# Patient Record
Sex: Male | Born: 1950 | Race: White | Hispanic: No | Smoking: Never smoker
Health system: Southern US, Community
[De-identification: ages and names within clinical notes are randomized; demographics above are authoritative.]

## PROBLEM LIST (undated history)

## (undated) DIAGNOSIS — C61 Malignant neoplasm of prostate: Secondary | ICD-10-CM

## (undated) DIAGNOSIS — R011 Cardiac murmur, unspecified: Secondary | ICD-10-CM

## (undated) DIAGNOSIS — I341 Nonrheumatic mitral (valve) prolapse: Secondary | ICD-10-CM

## (undated) DIAGNOSIS — I639 Cerebral infarction, unspecified: Secondary | ICD-10-CM

## (undated) DIAGNOSIS — T7840XA Allergy, unspecified, initial encounter: Secondary | ICD-10-CM

## (undated) DIAGNOSIS — I493 Ventricular premature depolarization: Secondary | ICD-10-CM

## (undated) DIAGNOSIS — I4891 Unspecified atrial fibrillation: Secondary | ICD-10-CM

## (undated) DIAGNOSIS — C439 Malignant melanoma of skin, unspecified: Secondary | ICD-10-CM

## (undated) DIAGNOSIS — I10 Essential (primary) hypertension: Secondary | ICD-10-CM

## (undated) DIAGNOSIS — E785 Hyperlipidemia, unspecified: Secondary | ICD-10-CM

## (undated) HISTORY — DX: Essential (primary) hypertension: I10

## (undated) HISTORY — DX: Malignant neoplasm of prostate: C61

## (undated) HISTORY — DX: Unspecified atrial fibrillation: I48.91

## (undated) HISTORY — DX: Malignant melanoma of skin, unspecified: C43.9

## (undated) HISTORY — DX: Ventricular premature depolarization: I49.3

## (undated) HISTORY — DX: Nonrheumatic mitral (valve) prolapse: I34.1

## (undated) HISTORY — DX: Cerebral infarction, unspecified: I63.9

## (undated) HISTORY — DX: Allergy, unspecified, initial encounter: T78.40XA

## (undated) HISTORY — DX: Cardiac murmur, unspecified: R01.1

## (undated) HISTORY — DX: Hyperlipidemia, unspecified: E78.5

---

## 2000-02-09 HISTORY — PX: MITRAL VALVE REPAIR: SHX2039

## 2000-04-27 ENCOUNTER — Ambulatory Visit (HOSPITAL_COMMUNITY): Admission: RE | Admit: 2000-04-27 | Discharge: 2000-04-27 | Payer: Self-pay | Admitting: Cardiology

## 2000-05-13 ENCOUNTER — Encounter: Payer: Self-pay | Admitting: Thoracic Surgery (Cardiothoracic Vascular Surgery)

## 2000-05-17 ENCOUNTER — Encounter: Payer: Self-pay | Admitting: Thoracic Surgery (Cardiothoracic Vascular Surgery)

## 2000-05-17 ENCOUNTER — Inpatient Hospital Stay (HOSPITAL_COMMUNITY)
Admission: RE | Admit: 2000-05-17 | Discharge: 2000-05-22 | Payer: Self-pay | Admitting: Thoracic Surgery (Cardiothoracic Vascular Surgery)

## 2000-05-17 ENCOUNTER — Encounter (INDEPENDENT_AMBULATORY_CARE_PROVIDER_SITE_OTHER): Payer: Self-pay | Admitting: Specialist

## 2000-05-18 ENCOUNTER — Encounter: Payer: Self-pay | Admitting: Thoracic Surgery (Cardiothoracic Vascular Surgery)

## 2000-05-19 ENCOUNTER — Encounter: Payer: Self-pay | Admitting: Thoracic Surgery (Cardiothoracic Vascular Surgery)

## 2003-03-24 ENCOUNTER — Emergency Department (HOSPITAL_COMMUNITY): Admission: EM | Admit: 2003-03-24 | Discharge: 2003-03-25 | Payer: Self-pay | Admitting: Emergency Medicine

## 2008-09-30 ENCOUNTER — Emergency Department (HOSPITAL_COMMUNITY): Admission: EM | Admit: 2008-09-30 | Discharge: 2008-09-30 | Payer: Self-pay | Admitting: Emergency Medicine

## 2010-05-16 LAB — POCT I-STAT, CHEM 8
BUN: 21 mg/dL (ref 6–23)
Calcium, Ion: 1.19 mmol/L (ref 1.12–1.32)
Chloride: 105 mEq/L (ref 96–112)
Creatinine, Ser: 1 mg/dL (ref 0.4–1.5)
Glucose, Bld: 96 mg/dL (ref 70–99)
HCT: 42 % (ref 39.0–52.0)
Hemoglobin: 14.3 g/dL (ref 13.0–17.0)
Potassium: 3.5 mEq/L (ref 3.5–5.1)
Sodium: 140 mEq/L (ref 135–145)
TCO2: 28 mmol/L (ref 0–100)

## 2010-06-26 NOTE — Op Note (Signed)
[name]. [facility]  Patient:    Robert Lynn, Robert Lynn                      MRN: [id number] Proc. Date: 05/17/00 Adm. Date:  [id number] Attending:  Tressie Stalker CC:         Robert Lynn, M.D.  Robert Lynn, M.D.  CVTS Office   Operative Report  PREOPERATIVE DIAGNOSIS:  Mitral valve prolapse with severe mitral regurgitation.  POSTOPERATIVE DIAGNOSIS:  Mitral valve prolapse with severe mitral regurgitation.  PROCEDURE:  Median sternotomy for mitral valve repair (quadrangular resection of posterior leaflet, chordal transfer from posterior to anterior leaflet x 2, artificial chordal replacement x 3, #25 Carpentier-Edwards ring annuloplasty)  SURGEON:  Robert Lynn. Robert Lynn, M.D.  ASSISTANT:  Robert Lynn, P.A.  ANESTHESIA:  General.  BRIEF CLINICAL NOTE:  The patient is a 60 year old male from [name], [name] followed by Dr. Theressa Lynn and referred by Dr. Carolanne Lynn for management of mitral regurgitation.  The patient has a history of mitral valve prolapse and he now presents with a 6 month history of progressive dyspnea on exertion and occasional palpitations.  Transesophageal echocardiogram demonstrates moderate to severe mitral valve regurgitation with prolapse involving both anterior and posterior leaflets of the mitral valve.  There is enlargement of the left atrium.  Left ventricular function appears preserved.  Coronary arteriography demonstrates normal coronary anatomy with no significant coronary artery disease.  OPERATIVE CONSENT:  The patient is counselled at length regarding the indications and potential benefits of mitral valve repair and possible replacement.  He understands the rational for proceeding under the current circumstances rather continued medical therapy.  Alternative strategies have been discussed.  Alternative surgical options have been discussed.  All of his questions have been addressed.  He accepts all  associated risks of surgery, including but not limited to risk of death, stroke, myocardial infarction, bleeding requiring blood transfusion, arrhythmia, heart block requiring permanent pacemaker, and potential need for prosthetic valve mitral valve replacement.  DESCRIPTION OF PROCEDURE:  The patient was brought to the operating room on the above-mentioned date and invasive hemodynamic monitoring is established by the anesthesia service under the care and direction of Dr. Sheldon Lynn. Intravenous antibiotics are administered.  Following the induction with general endotracheal anesthesia, transesophageal echocardiogram is performed by Dr. Ivin Lynn with Dr. Sharee Lynn assisting.  Transesophageal echocardiogram confirms the presence of severe chronic prolapse involving both the anterior and posterior leaflets with redundant leaflet tissue and severe mitral regurgitation.  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  There is no sign of any flail cords or sign of ruptured cords.  No other significant abnormalities are noted.  Left ventricular function appears preserved although the left ventricle is mildly to moderately dilated.  The patients chest, abdomen, both groins and both lower extremities are prepped and draped in a sterile manner.  A median sternotomy incision is performed.  The patient is heparinized systemically.  The pericardium is opened.  The ascending aorta is inspected and is notably free of any palpable plaques or calcifications.  The ascending aorta is cannulated uneventfully.  Dual venous cannulation is established using a right-angle metal tipped cannula placed directly in the superior vena cava with a straight cannula placed through the right atrial appendage into the inferior vena cava.  A retrograde cardioplegia catheter is placed through the right atrium into the coronary sinus.  Adequate heparinization is verified.  Cardiopulmonary bypass is  begun.  Umbilical tapes are placed around both the superior and inferior vena cava.  A cardioplegia catheter is placed in the ascending aorta.  A temperature probe is placed in the left ventricular septum.   The intra-atrial groove is dissected posteriorly to facilitate exposure.  The patient is cooled to 28 degrees systemic temperature.  The aortic cross-clamp is applied and cardioplegia is delivered in an antegrade fashion through the aortic root.  Iced saline slush is applied for topical hypothermia.  The initial cardioplegic arrest and myocardial cooling are found to be excellent.  Repeat doses of cardioplegia are administered retrograde through the coronary sinus catheter intermittently throughout the cross-clamp portion of the operation to maintain septal temperature below 15 degrees centigrade.  A left atriotomy incision is performed through the intra-atrial groove posteriorly and the mitral valve is exposed using a self-retaining retractor. The mitral valve is carefully analyzed and inspected.  The annulus is dilated. There is severe prolapse involving both the anterior and posterior leaflets of the mitral valve with chronic elongation of the majority of the primary cords for both leaflets.  The leaflets are thickened and redundant.  The commissures themselves are not prolapsed.  The most severe area of prolapse is located eccentrically between the p1 and p2 portions of the posterior leaflet and directly across at the corresponding portion of the anterior leaflet between a1 and a2.  There are no flail cords identified and the subvalvular apparatus appears otherwise normal with exception of the elongation of many of the primary cords.  Quadrangular resection of the posterior leaflet is performed removing approximately 1/4 to 1/3 of the posterior leaflet somewhat eccentrically  between the p1 and p2 portions of the posterior leaflet.  During mobilization of this portion of the  valve 2 large secondary cords from the posterior leaflet are identified which are not elongated and appear to be ideally situated to be transferred to the anterior leaflet to facilitate correction of the prolapsed anterior leaflet.  These are saved during mobilization.  The resected portion of the posterior leaflet is set for routine pathologic evaluation.  A sliding leaflet annuloplasty is performed to close the remainder of the posterior leaflet.  Prior to reattaching the posterior leaflet after mobilization in both directions, several interrupted 2-0 Ethibond sutures are placed through the annulus posteriorly to serve as plication sutures or compression sutures shortening the posterior circumference.  The posterior leaflet is then reattached to the posterior annulus using 2 layer running 4-0 Ethibond suture. The remaining vertical defect between the two corresponding portions of the posterior leaflet is closed using several interrupted 5-0 Ethibond sutures. The anterior leaflet prolapse is then corrected by transposing two separate secondary cords from the posterior leaflet to the rough zone along the undersurface of the anterior leaflet.  In addition, a 6-0 Gore-Tex suture is placed through the head of the anterior papillary muscle to be reattached to the undersurface of the anterior leaflet during a later portion of the procedure to further buttress the correction of the prolapse of the anterior leaflet.  Ring annuloplasty is performed using interrupted horizontal mattress 2-0 Ethibond sutures placed circumferentially around the mitral annulus.  The annulus is sized to between 28 and a 30 mm ring.  A 28 mm standard Carpentier-Edwards classic ring is chosen because of the associated geometry of the valve with a relatively elongated anterior-posterior direction.  The ring annuloplasty is completed and the ring is secured in place circumferentially without difficulty.  At this  juncture the Gore-Tex suture previously placed  through the head of the anterior papillary muscle is then secured to the undersurface of the anterior leaflet of the mitral valve.  The left ventricle is inflated with iced cold saline in order to measure the appropriate length to tie the Gore-Tex artificial cord suture.  After completion of the repair there appears to be a small area of extra redundancy with prolapse of the posterior leaflet between the p2 and p3 portion of the leaflet.  There is no associated regurgitation.  However, in order to correct this prolapse two additional artificial cords are placed using 6-0 Gore-Tex suture placed through the posterior papillary muscle head and secured to the rough zone of the undersurface of the posterior leaflet.  Again, the left ventricle is filled with iced saline in order to measure the appropriate length to secure the suture replacing the artificial cords.  After final completion of the repair the valve is tested and appears to be perfectly competent.  Rewarming is begun.  The left atriotomy is closed using a 2 layer closure of running 3-0 Prolene suture.  After completion of the closure the left heart circulation is allowed to fill with blood while the lungs are ventilated and the patient is placed in the Trendelenburg position.  All air is evacuated from the pulmonary veins, left atrium, left ventricle, and aortic root while venting through the aortic root.  Once final dose of warm retrograde hot shot cardioplegia is administered.  The aortic cross-clamp is removed after a total cross-clamp time of 155 minutes.  The heart begins to beat spontaneously without need for cardioversion. Epicardial pacing wires are affixed to the right ventricular outflow track and to the right atrial appendage.  The umbilical tapes are removed and the retrograde cardioplegia catheter is removed.  The superior vena cava cannula is removed while the inferior  cannula is pulled back into the right atrium.  The patient is rewarmed to greater than 37 degrees centigrade temperature.  The patient is weaned from cardiopulmonary bypass without difficulty.  The patients rhythm at separation from bypass is junctional rhythm with AV sequential pacing employed.  No inotropic support is necessary.  Total cardiopulmonary bypass time for the operation is 196 minutes.  Follow up transesophageal echocardiogram performed after separation from bypass demonstrates a well seated annuloplasty ring with no residual mitral regurgitation.  The repair appears intact throughout and there appears to be excellent coaptation of both the anterior and posterior leaflet.  There is no sign of any systolic anterior motion of the mitral valve.  The venous and arterial cannulae are removed uneventfully after ensuring that no residual air is present in the left heart circulation.  Protamine is administered to reverse the anticoagulation.  The mediastinum is irrigated with saline solution containing vancomycin.  Meticulous surgical hemostasis is ascertained.  The mediastinum is drained with 2 chest tubes placed through separate stabilized incisions inferiorly. The median sternotomy is closed in a routine fashion.  The skin incision is closed with a subcuticular skin closure.  The patient tolerated the procedure well and is transported to the surgical intensive care unit in stable condition.  There are no intraoperative complications.  All sponge, instrument and needle counts are verified correct at the completion of the operation.  No autologous blood products were administered. DD:  05/17/00 TD:  05/17/00 Job: [id number]

## 2010-06-26 NOTE — Discharge Summary (Signed)
[place]. [facility]  Patient:    Robert Lynn, Robert Lynn                        MRN: [id number] Adm. Date:  05/17/00 Disc. Date: 05/22/00 Attending:  Salvatore Decent. Cornelius Moras, M.D. Dictator:   Durenda Age, P.A.-C. CC:         Armanda Magic, M.D.  Theressa Millard, M.D.   Discharge Summary  DATE OF BIRTH:  1951-01-11  DISCHARGE DIAGNOSES:  1. Previous history of mitral valve prolapse with severe mitral     regurgitation status post MVR.  2. Hypertension.  3. Accelerated junctional reading.  4. Postoperative anemia, resolved.  5. Coumadin therapy.  SURGERIES:  1. Status post median sternotomy for MVR--______ resection of posterior     leaflet, core valve transfer from posterior to anterior leaflet x 2,     artificial core valve replacement x 3, #25 Carpentier Edwards ring     annuloplasty by Dr. Tressie Stalker on May 17, 2000.  2. Status post intraoperative TEE on May 17, 2000, Dr. Ivin Booty.  DISCHARGE MEDICATIONS:  1. Coumadin as directed.  2. Niferex 150 mg q.d.  3. Darvocet-N 100 1 or 2 p.o. q. 4-6h p.r.n. for pain.  4. Flonase 1 or 2 puffs in each nostril q.d.  ALLERGIES:  No known drug allergies.  CONDITION ON DISCHARGE:  Improved.  PLAN:  The patient will check his pro time on Wednesday, May 25, 2000. He will follow-up with Dr. Mayford Knife two weeks after discharge and he will follow-up with Dr. Cornelius Moras three weeks after discharge.  HOSPITAL COURSE:  [name] is a pleasant 60 year old white male from [place], [name] followed by Dr. Earl Gala, referred by Dr. Armanda Magic for management of mitral regurgitation. The patient has a history of mitral valve prolapse and he now presents with a six month history of progressive dyspnea on exertion and occasional palpitations. TEE demonstrated moderate to severe mitral valve regurgitation with prolapse involved in both anterior and posterior leaflets of the mitral valve. There was enlargement of the  left atrium. Left ventricular function appeared preserved. Coronary arteriography demonstrated normal coronary anatomy with no significant coronary artery disease. Based on these findings of mitral regurgitation with rapidly progressive symptoms of class 3 congestive heart failure and associated enlargement of the left ______ follow-up examinations, Dr. Cornelius Moras believed that surgical intervention for repair of the mitral valve was certainly indicated and appropriate. On May 17, 2000, the patient underwent the mitral valve repair as detailed above. The patient tolerated the procedure well. No blood products were given. He was transferred to the SICU in stable condition. The patients stay in the hospital was remarkable for the following:  1. Accelerated junctional reading, appearing on May 18, 2000. According    to the notes, this reading has been stable since the OR, and is common    after mitral valve repair, especially with cannulation of the SVC. Once    again according to Dr. Barry Dienes notes, this should resolve within the    next few weeks. 2. Coumadin therapy. The patient was started on Coumadin on May 18, 2000    with a loading dose of 5.0 mg. He quickly is reaching the therapeutic    goals; on May 21, 2000 he has obtained an INR of 2.4. It is predicted    that he will obtain a desirable INR upon discharge. Overall, the patients    condition continued to improve. It is  predicted that he will be discharged    on May 22, 2000 in stable condition. DD:  05/21/00 TD:  05/21/00 Job: [id number]

## 2010-06-26 NOTE — Procedures (Signed)
. [facility]  Patient:    Robert Lynn, Robert Lynn                      MRN: [id number] Proc. Date: 05/17/00 Adm. Date:  [id number] Attending:  Tressie Stalker CC:         Anesthesia Department   Procedure Report  PROCEDURE:  Intraoperative transesophageal echocardiography.  DESCRIPTION OF PROCEDURE:  Mr. Hanning was brought to the operating room by Dr. Cornelius Moras for evaluation and possible repair versus replacement of his mitral valve.  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  After satisfactory induction of general anesthesia the ______ omniplane TEE probe was passed through the oral pharynx into the esophagus in atraumatic fashion.  The left ventricle was examined and found to be slightly thickened in its walls although contractility was quite normal.  The right ventricle was easily seen with the Swan catheter passing through it and was felt to be normal.  The mitral valve was examined and was quite floppy in both the anterior and posterior leaflets.  There was a large amount of prolapse involved as well as 3 to 4+ regurgitation noted on Color Flow Doppler.  The left atrium was examined and felt to be normal in size and the left atrial appendage was also seen to be free of clot or smoke.  The aortic valve was seen and was tricuspid in nature with no extra calcification.  There was trace regurgitation in the aortic valve on color flow.  The patient was placed on cardiopulmonary bypass by Dr. Cornelius Moras and the mitral valve was repaired.  The details of the repair are included in his operative note.  On weaning from bypass the heart was slightly filled and the valve area of the valve valve was examined.  The ring was easily seen.  In the longitudinal view it was noted that the valve had 0 regurgitation.  This was confirmed by Dr. Jacklynn Bue as well as myself.  The valve ring was seated quite well.  The left ventricle  was performing well.  The patient continued to be weaned from bypass and the chest was closed successfully.  The intraoperative TEE probe was removed atraumatically and the patient was transported to the SICU in good condition. DD:  05/20/00 TD:  05/20/00 Job: [id number]

## 2010-06-26 NOTE — Cardiovascular Report (Signed)
. [facility]  Patient:    Robert Lynn, Robert Lynn                        MRN: [id number] Proc. Date: 04/27/00 Attending:  Armanda Magic, M.D. CC:         Winn [place]. Earl Gala, M.D.   Cardiac Catheterization  PROCEDURES PERFORMED:  Right and left heart catheterization.  INDICATIONS:  Dyspnea, MR, MVP.  COMPLICATIONS:  None.  INDICATIONS:  This is a very pleasant 60 year old white male, with a past medical history significant for allergic rhinitis, mitral valve prolapse diagnosed in 1990, and hypertension, who presented with worsening dyspnea on exertion.  He tends to get dyspnea with minimal exertion.  This has just recently become a problem.  He saw Dr. Earl Gala recently complaining that he was having worsening dyspnea.  He does have some problems with reactive airways disease on top of his allergic rhinitis and uses an inhaler from time to time but he has noted some worsening dyspnea over the past year.  Usually associated with activities where he has to all of a sudden run up stairs or do something exertional.  He denies any chest pain with dyspnea.  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  Left atrium was mildly enlarged at 44 mm.  He now presents for right and left hearth catheterization.  DESCRIPTION OF PROCEDURE:  The patient is brought to the cardiac catheterization laboratory in a fasting, nonsedated state.  Informed consent was obtained.  The patient was connected to continuous heart rate and pulse oximetry monitoring, and intermittent blood pressure monitoring.  The right groin was prepped and draped in a sterile fashion.  Lidocaine 1% was used for local anesthesia.  A total of 2 mg of Versed and 25 mcg of fentanyl IV were given for sedation.   Using the modified Seldinger technique, a 6 French sheath was placed in the right femoral artery.  Again, using an 8 French sheath was placed in the right femoral vein.  A 7 French Swan-Ganz catheter was then inserted through the right femoral vein and the balloon was inflated.  The catheter was advanced under fluoroscopic guidance in the right atrium.  Right atrial pressure was measured.  The catheter was advanced in the right ventricle and right ventricular pressure was measured.  The right atrium was advanced in the pulmonary artery with sampling of pulmonary artery pressure. The catheter was then placed in the pulmonary capillary wedge position and pressure measured.  The balloon was then deflated and the catheter was pulled back in the proximal pulmonary artery.  The blood was sampled for O2 saturation in the pulmonary artery.  Cardiac outputs were then performed using 10 cc of saline.  Four different cardiac outputs were obtained.  The catheter was then pulled back in the right ventricle and sampling of blood for oxygen saturation was obtained.  The catheter was then pulled back into the right atrium; again, O2 saturation blood samples performed.  The catheter was then removed.  A 6 French angled pigtail catheter was then placed over a guidewire under fluoroscopic guidance in the left ventricular cavity.  Left ventriculography was performed in the 30 degree RAO view using a total of 40 cc of IV contrast at 13 cc/sec.  The catheter was then pulled back across the aortic  valve and no significant pressure gradient was noted.  The catheter was then removed over a guidewire.  A 6 French Judkins JL4 catheter was placed under fluoroscopic guidance into the area of the left coronary artery.  Upon multiple attempts, we were unsuccessful at engaging the left coronary artery. This catheter was then exchanged out over a guidewire for 6 [place] JL 3.5 catheter which was placed under fluoroscopic  guidance to the left coronary. Multiple cine films were taken in the RAO 30 degree and LAO 40 degree views. This catheter was then exchanged out over a wire for a 6 [place] JR4 catheter which was placed in the right coronary artery.  Multiple cine films were taken in the RAO 30 degree and LAO 40 degree views.  This catheter was then removed. At the end of the procedure, all catheters and sheaths were removed.  Manual compression was performed until adequate hemostasis was obtained.  The patient was transferred back to his room in stable condition.  RESULTS:  RIGHT HEART CATHETERIZATION DATA:  Right atrial pressure, mean 9 mmHg.  RV pressure 25/10 mmHg, PA pressure, mean 17 mmHg, pulmonary capillary wedge, mean 12 mmHg, aortic pressure 129/76 mmHg.  LV 130/17 mmHg.  Cardiac output by thermodilution 4.1, and by Fick 3.9.  Cardiac index by thermodilution 2.43, and by Fick 2.3.  Aortic saturation 97%, PA saturation 72%, RV saturation 74%, RA saturation 72%.  LEFT VENTRICULOGRAPHY:  Left ventriculography performed in the RAO position showed normal left ventricular function with 3-4+ MR.  The left atrium completely filled within one to two heart beats.  There was evidence of anterior and posterior mitral valve prolapse.  The left main coronary artery was widely patent and bifurcated into the left anterior descending and left circumflex arteries.  The left anterior descending artery gave rise to two large diagonal branches. Between the first and second diagonals was a very small 20% narrowing of the LAD.  The LAD was widely patent throughout the rest of the course and gave rise to two distal third and fourth diagonal branches which were widely patent.  The left circumflex was widely patent throughout its course and gave rise to two proximal small obtuse marginal branches and then a large obtuse marginal three branch which gave rise to two daughter branches, both of which were widely  patent.  The left circumflex traversed the AV groove distally but  trifurcated into three daughter branches, all of which were widely patent.  The right coronary artery is widely patent throughout its course with some diffuse luminal irregularities and bifurcates into a posterior descending artery and posterolateral artery which were widely patent.  IMPRESSION: 1. Essentially normal coronary arteries. 2. Normal left ventricular function. 3. Dilated left atrium. 4. Severe mitral regurgitation with anterior posterior mitral valve prolapse. 5. Normal pulmonary pressure. 6. Normal cardiac indices. 7. Normal oxygen saturation.  PLAN:  CVTS consult for mitral valve repair. DD:  04/27/00 TD:  04/27/00 Job: [id number]

## 2010-11-19 ENCOUNTER — Other Ambulatory Visit (HOSPITAL_COMMUNITY): Payer: Self-pay

## 2010-11-19 ENCOUNTER — Inpatient Hospital Stay (HOSPITAL_COMMUNITY)
Admission: AD | Admit: 2010-11-19 | Discharge: 2010-11-20 | DRG: 066 | Disposition: A | Discharge status: Home / Self Care | Payer: PRIVATE HEALTH INSURANCE | Source: Other Acute Inpatient Hospital | Attending: Internal Medicine | Admitting: Internal Medicine

## 2010-11-19 ENCOUNTER — Inpatient Hospital Stay (HOSPITAL_COMMUNITY): Payer: PRIVATE HEALTH INSURANCE

## 2010-11-19 DIAGNOSIS — E785 Hyperlipidemia, unspecified: Secondary | ICD-10-CM | POA: Diagnosis present

## 2010-11-19 DIAGNOSIS — E669 Obesity, unspecified: Secondary | ICD-10-CM | POA: Diagnosis present

## 2010-11-19 DIAGNOSIS — I6350 Cerebral infarction due to unspecified occlusion or stenosis of unspecified cerebral artery: Principal | ICD-10-CM | POA: Diagnosis present

## 2010-11-19 DIAGNOSIS — R4789 Other speech disturbances: Secondary | ICD-10-CM | POA: Diagnosis present

## 2010-11-19 DIAGNOSIS — Z8673 Personal history of transient ischemic attack (TIA), and cerebral infarction without residual deficits: Secondary | ICD-10-CM

## 2010-11-19 DIAGNOSIS — Z7982 Long term (current) use of aspirin: Secondary | ICD-10-CM

## 2010-11-19 DIAGNOSIS — I10 Essential (primary) hypertension: Secondary | ICD-10-CM | POA: Diagnosis present

## 2010-11-19 LAB — COMPREHENSIVE METABOLIC PANEL
AST: 22 U/L (ref 0–37)
Albumin: 3.6 g/dL (ref 3.5–5.2)
Chloride: 103 mEq/L (ref 96–112)
Creatinine, Ser: 1 mg/dL (ref 0.50–1.35)
Total Bilirubin: 0.4 mg/dL (ref 0.3–1.2)
Total Protein: 7.1 g/dL (ref 6.0–8.3)

## 2010-11-19 LAB — HEMOGLOBIN A1C: Hgb A1c MFr Bld: 6 % — ABNORMAL HIGH (ref <5.7)

## 2010-11-19 LAB — LIPID PANEL
Cholesterol: 227 mg/dL — ABNORMAL HIGH (ref 0–200)
LDL Cholesterol: 158 mg/dL — ABNORMAL HIGH (ref 0–99)
Triglycerides: 102 mg/dL (ref <150)

## 2010-11-19 LAB — CBC
Hemoglobin: 14.9 g/dL (ref 13.0–17.0)
MCHC: 33.9 g/dL (ref 30.0–36.0)
Platelets: 254 10*3/uL (ref 150–400)
RBC: 4.92 MIL/uL (ref 4.22–5.81)

## 2010-11-19 LAB — URINALYSIS, MICROSCOPIC ONLY
Glucose, UA: NEGATIVE mg/dL
Hgb urine dipstick: NEGATIVE
Leukocytes, UA: NEGATIVE
Protein, ur: 30 mg/dL — AB
Specific Gravity, Urine: 1.027 (ref 1.005–1.030)
pH: 6 (ref 5.0–8.0)

## 2010-11-19 LAB — PROTIME-INR
INR: 1.15 (ref 0.00–1.49)
Prothrombin Time: 14.9 seconds (ref 11.6–15.2)

## 2010-11-19 LAB — CARDIAC PANEL(CRET KIN+CKTOT+MB+TROPI)
CK, MB: 3.7 ng/mL (ref 0.3–4.0)
Relative Index: 3.4 — ABNORMAL HIGH (ref 0.0–2.5)
Troponin I: <0.3 ng/mL (ref <0.30)

## 2010-11-19 MED ORDER — GADOBENATE DIMEGLUMINE 529 MG/ML IV SOLN
15.0000 mL | Freq: Once | INTRAVENOUS | Status: AC | PRN
  Administered 2010-11-19: 15 mL via INTRAVENOUS

## 2010-11-21 NOTE — Discharge Summary (Signed)
Robert Lynn, CORTRIGHT NO.:  [id number]  MEDICAL RECORD NO.:  [id number]  LOCATION:  3031                         FACILITY:  [facility]  PHYSICIAN:  Theressa Millard, M.D.    DATE OF BIRTH:  [birth year]  DATE OF ADMISSION:  11/19/2010 DATE OF DISCHARGE:  11/20/2010                              DISCHARGE SUMMARY   ADMITTING DIAGNOSIS:  Stroke.  DISCHARGE DIAGNOSES: 1. Left cerebral hemispheric stroke with dysphasia. 2. 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 3. Status post mitral valve repair, 2002. 4. Hypertension. 5. Hyperlipidemia.  HISTORY OF PRESENT ILLNESS:  The patient is a 60 year old white male who has a history of mitral valve repair and never really had any neurologic symptoms.  He presented to [facility] Emergency Room with word-finding difficulties x1 day.  A CT scan showed evidence of stroke. He requested transfer to [facility].  HOSPITAL COURSE:  The patient was admitted and underwent an MRI, MRA, which revealed evidence of acute infarction approximately 83-59 days old in the left hemisphere.  There was no hemorrhage or mass effect.  There was also evidence of remote right and left parietal infarcts with chronic hemorrhage.  I reviewed this with the radiologist who read the report, Dr. Benard Rink, who stated that this is often seen in embolic phenomena.  An MRA showed no evidence of carotid disease.  There was no intracranial atherosclerosis that was significant.  He underwent a 2D echocardiogram which showed an LV ejection fraction of approximately 50% with some concentric hypertrophy.  There was no regional wall motion abnormality.  There was evidence of abnormal relaxation and increased filling pressure, consistent with grade 2 diastolic dysfunction.  No other defect was noted.  He underwent a transesophageal echocardiogram which revealed normal ring structure and valve function.   No clots were noted.  I discussed the case with Dr. Anne Fu who had done the transesophageal echocardiogram and with Dr. Katrinka Blazing who had seen the patient in consultation and there was some discussion of whether the patient should be anticoagulated with Coumadin.  This was on the basis of the 3 different infarcts that were noted and 3 different distributions without any obvious source on evaluation.  It would be therefore presumed that this was coming from a mitral valve repair.  In this situation, Coumadin would be a reasonable alternative.  I have discussed this with the patient.  I would like to think this over before making a final decision, and so the patient was discharged in improved condition.  He will be followed up in the office within a week.  DISCHARGE MEDICATIONS: 1. Hydrochlorothiazide 12.5 mg daily. 2. Ramipril 10 mg daily. 3. Atorvastatin 20 mg daily. 4. QVAR 80 two puffs daily to b.i.d. 5. Flonase 1 puff in each nostril twice daily. 6. Aspirin 325 mg daily.  ACTIVITY:  No restrictions.  We will call the patient to make a followup appointment for him to see me as well as for speech therapy.  DIET:  No added salt.     Theressa Millard, M.D.     JO/MEDQ  D:  11/20/2010  T:  11/20/2010  Job:  [id number]  Electronically Signed by Theressa Millard M.D. on 11/21/2010 08:47:47 PM

## 2010-11-25 NOTE — H&P (Signed)
[name], Lynn NO.:  [id number]  MEDICAL RECORD NO.:  [id number]  LOCATION:  3031                         FACILITY:  [facility]  PHYSICIAN:  Altha Harm, MDDATE OF BIRTH:  11-20-1950  DATE OF ADMISSION:  11/19/2010 DATE OF DISCHARGE:                             HISTORY & PHYSICAL   CHIEF COMPLAINT:  Word-finding difficulties x1 day.  HISTORY OF PRESENT ILLNESS:  [name] is a very lovely 60 year old gentleman who is from [place], [name], but presently he is working in [place], [name].  The patient apparently was out with his wife on the night before admission at [facility] when he noticed that he was having difficulty naming things.  The patient states that he could write things down, but he had difficulty with naming and saying them.  This persisted for about 24 hours.  The patient went back to work and this was noted by his boss who then asked him to go to the emergency room for further evaluation.  The patient was seen in the emergency room in [place], [name].  A CT scan showed a subacute CVA in the MCA territory, however, there is no acute bleed going on.  The patient requests to be transferred back to [facility] where his primary care physician practices and resides.  The patient states that except for the word-finding difficulties, he had no other neurological symptoms.  He has had no difficulty with his strength.  He has had no difficulty with his movement.  He has had no drooling.  He has had no vision changes.  The only change is that he can identify is the word-finding difficulties.  The patient has had no chest pain.  He has had no nausea, vomiting, diarrhea.  He has had no dizziness, no seizure, no loss of consciousness, no near syncope.  He has had no problems with urination, no problems with elimination.  Please note that the patient is able to give information regarding his health, however, his  speech is not fluent and he is still having difficulties with articulating words, although he does better if he writes the words down.  PAST MEDICAL HISTORY:  Significant for: 1. Hypertension. 2. TIA. 3. Mitral valve replacement. 4. Hyperlipidemia.  FAMILY HISTORY:  The patient denies any chronic family history.  SOCIAL HISTORY:  The patient lives with his wife in [place], [name], she is his healthcare power of attorney and next of kin, her name is [place], she can be reached at home at [phone], another cell phone at [phone].  The patient denies any tobacco, alcohol, or drug use.  ALLERGIES:  No known drug allergies.  CURRENT MEDICATIONS:  Include the following: 1. Hydrochlorothiazide 12.5 mg p.o. daily. 2. Ramipril 10 mg p.o. daily. 3. Atorvastatin 20 mg p.o. daily. 4. QVAR 80 two puffs inhaled q.4 hours p.r.n. 5. Flonase 1 puff each nostril b.i.d.  PRIMARY CARE PHYSICIAN:  Theressa Millard, MD  REVIEW OF SYSTEMS:  All other systems negative except as noted in the HPI.  DIAGNOSTIC STUDIES:  In the emergency room, please note the patient had a CT scan which showed subacute left MCA territory infarct.  No acute  hemorrhage.  A hemogram shows a white blood cell count of 9.6, hemoglobin of 15, hematocrit of 44.9, platelet count of 231.  Sodium 139, potassium 4.0, chloride 105, bicarb 28, BUN 18, creatinine 1.41. PT is 11 and INR is 1.0.  Cardiac enzymes initially showed a CK of 129, a CK-MB of 3.1, and troponin of less than 0.02.  A 12-lead EKG shows occasional PVC and nonspecific ST-T wave changes.  PHYSICAL EXAMINATION:  VITAL SIGNS:  Temperature is 97.8, heart rate 62, blood pressure 176/104, respiratory rate 18, O2 sats are 95% on room air. HEENT:  He is normocephalic, atraumatic.  Pupils equally round and reactive to light and accommodation.  Extraocular movements intact. Oropharynx is moist. No exudate, erythema, or lesions are noted. NECK:   Trachea is midline.  No masses.  No thyromegaly.  No JVD.  No carotid bruits. RESPIRATORY:  The patient has a normal respiratory effort.  Equal excursion bilaterally.  No wheezing or rhonchi noted. CARDIOVASCULAR:  He has got a normal S1 and S2.  No murmurs, rubs, or gallops are noted.  PMI is nondisplaced.  No heaves or thrills on palpation. ABDOMEN:  Obese, soft, nontender, nondistended.  No masses.  No hepatosplenomegaly is noted. EXTREMITIES:  No clubbing, cyanosis, or edema. NEUROLOGIC: The patient has only focal neurological deficits that the patient demonstrates is the difficulty in word finding.  Otherwise, cranial nerves II-XII are grossly intact.  DTRs are 2+ bilaterally in the upper and lower extremities.  Strength is 5/5 in bilateral upper and lower extremities.  The patient has no past-pointing and there is no adiadochokinesis present. MUSCULOSKELETAL:  He has got no warmth, swelling, or erythema around the joints.  No spinal tenderness.  ASSESSMENT AND PLAN:  This is a patient who presents with what appears to be a subacute middle cerebral artery territory cerebrovascular accident.  The patient has had the stroke workup partially done, we will complete it here by an MRI, carotid duplex, and likely an MRA.  This patient has a valvuloplasty and it might be reasonable to consider a TEE in a patient given his risk for embolic phenomenon.  The patient had been on 81 mg of aspirin and he will be increased to 325 mg.  The patient has significant hypertension with diastolic blood pressure approaching 105.  I would not treat emergently on this patient so as to maintain adequate perfusion pressures.  If the diastolic blood pressure goes up above 105, then I will consider giving some agents to have a small decrease in blood pressure.  I do not believe that this patient's blood pressures come down anywhere below 170 over may be mid-to-high 90s.  These parameters affected the next  24 hours.  In terms of his hyperlipidemia, an independent stroke risk reduction, the patient will be placed on Crestor 10 mg at bedtime.  We will ask for a stroke swallow screen.  If the patient passes a stroke swallow screen, then he can be on a heart-healthy diet.  We will get a speech pathology to evaluate the patient at this time, however, I see no need for physical therapy and occupational therapy as the patient does not have any deficits unless he becomes apraxic and needs more of a cognitive input in order to achieve his physical goals.  Dr. Earl Gala will see this patient starting on tomorrow.     Altha Harm, MD     MAM/MEDQ  D:  11/19/2010  T:  11/19/2010  Job:  [id number]  Electronically Signed by Marthann Schiller MD on 11/25/2010 07:58:02 AM

## 2010-12-02 ENCOUNTER — Ambulatory Visit: Payer: 59 | Attending: Internal Medicine

## 2010-12-02 DIAGNOSIS — I69920 Aphasia following unspecified cerebrovascular disease: Secondary | ICD-10-CM | POA: Insufficient documentation

## 2010-12-02 DIAGNOSIS — IMO0001 Reserved for inherently not codable concepts without codable children: Secondary | ICD-10-CM | POA: Insufficient documentation

## 2010-12-10 ENCOUNTER — Ambulatory Visit: Payer: PRIVATE HEALTH INSURANCE | Attending: Internal Medicine

## 2010-12-10 DIAGNOSIS — IMO0001 Reserved for inherently not codable concepts without codable children: Secondary | ICD-10-CM | POA: Insufficient documentation

## 2010-12-10 DIAGNOSIS — I69920 Aphasia following unspecified cerebrovascular disease: Secondary | ICD-10-CM | POA: Insufficient documentation

## 2010-12-11 ENCOUNTER — Ambulatory Visit: Payer: PRIVATE HEALTH INSURANCE

## 2010-12-21 ENCOUNTER — Ambulatory Visit: Payer: PRIVATE HEALTH INSURANCE

## 2010-12-24 ENCOUNTER — Ambulatory Visit: Payer: PRIVATE HEALTH INSURANCE

## 2010-12-28 ENCOUNTER — Ambulatory Visit: Payer: PRIVATE HEALTH INSURANCE

## 2011-01-04 ENCOUNTER — Ambulatory Visit: Payer: PRIVATE HEALTH INSURANCE | Admitting: Speech Pathology

## 2011-01-11 ENCOUNTER — Encounter: Payer: PRIVATE HEALTH INSURANCE | Admitting: Speech Pathology

## 2014-06-01 ENCOUNTER — Ambulatory Visit (INDEPENDENT_AMBULATORY_CARE_PROVIDER_SITE_OTHER): Payer: Worker's Compensation | Admitting: Emergency Medicine

## 2014-06-01 DIAGNOSIS — S46111A Strain of muscle, fascia and tendon of long head of biceps, right arm, initial encounter: Secondary | ICD-10-CM

## 2014-06-01 DIAGNOSIS — S46211A Strain of muscle, fascia and tendon of other parts of biceps, right arm, initial encounter: Secondary | ICD-10-CM

## 2014-06-01 NOTE — Patient Instructions (Signed)
Biceps Tendon Disruption (Distal) with Rehab The biceps tendon attaches the biceps muscle to the bones of the elbow and the shoulder. A distal biceps tendon disruption is a tear of this tendon at the end the attached near the elbow. A distal biceps tendon rupture is an uncommon injury. These injuries usually involve a complete tear of the tendon from the bone; however, partial tears are also possible. The bicep muscle works with other muscles to bend the elbow and rotate the palm upward (supinate). A complete biceps rupture will result in approximately a 30% decrease in elbow bending strength and a 40% decrease in one's ability to supinate the wrist. SYMPTOMS   Pain, tenderness, swelling, warmth, or redness at the elbow, usually in the front of the elbow.  Pain that worsens with flexion of the elbow against resistance and when straightening the elbow.  Bulge can be seen and felt in the arm.  Bruising (contusion) in the elbow or forearm after 24 hours.  Limited motion of the elbow.  Weakness with attempted elbow bending (lifting or carrying) or rotation of the wrist (like when using a screwdriver).  A crackling sound (crepitation) when the tendon or elbow is moved or touched. CAUSES  A biceps tendon rupture occurs when the tendon is subjected to a force that is greater than it can withstand, such as straightening the elbow while the biceps is contracted or direct trauma (rare). RISK INCREASES WITH:   Sports that involve contact, as well as throwing sports, gymnastics, weightlifting, and bodybuilding.  Heavy labor.  Poor strength and flexibility.  Failure to warm-up properly before activity. PREVENTION  Warm up and stretch appropriately before activity.  Maintain physical fitness:  Strength, flexibility, and endurance.  Cardiovascular fitness  Allow your body to recover between practices and competition.  Learn and use proper technique. PROGNOSIS  Surgery is usually required  to fix distal biceps tendon rupture. After surgery, a recovery period of 4 to 8 months can be expected to allow for healing and a return to sports.  RELATED COMPLICATIONS  Weakness of elbow bending and forearm rotation, especially if treated non-surgically.  Prolonged disability.  Re-rupture of the tendon after surgery.  Risks of surgery, including infection, bleeding, injury to nerves, elbow or wrist stiffness or loss of motion, and weakness of elbow bending or wrist rotation. TREATMENT  Treatment initially consists of ice and medication to help reduce pain and inflammation. A sling may also be worn to increase one's comfort. Surgery is required for a full recovery and return to sports. Surgery involves reattaching the tendon to the bone. Weakness can be expected if surgery is not performed; however, this may be acceptable for sedentary individuals. Surgery is usually followed by immobilization and rehabilitation exercises to regain strength and a full range of motion.  MEDICATION  If pain medication is necessary, nonsteroidal anti-inflammatory medications, such as aspirin and ibuprofen, or other minor pain relievers, such as acetaminophen, are often recommended.  Do not take pain medication for 7 days before surgery.  Prescription pain relievers may be given if deemed necessary by your caregiver. Use only as directed and only as much as you need. HEAT AND COLD  Cold treatment (icing) relieves pain and reduces inflammation. Cold treatment should be applied for 10 to 15 minutes every 2 to 3 hours for inflammation and pain and immediately after any activity that aggravates your symptoms. Use ice packs or an ice massage.  Heat treatment may be used prior to performing the stretching and strengthening  activities prescribed by your caregiver, physical therapist, or athletic trainer. Use a heat pack or a warm soak. SEEK MEDICAL CARE IF:   Symptoms get worse or do not improve in 2 weeks despite  treatment.  You experience pain, numbness, or coldness in the hand.  Blue, gray, or dark color appears in the fingernails.  Any of the following occur after surgery:  Increased pain, swelling, redness, drainage, or bleeding in the surgical area.  Signs of infection (headache, muscle aches, dizziness, or a general ill feeling with fever).  New, unexplained symptoms develop (drugs used in treatment may produce side effects). EXERCISES RANGE OF MOTION (ROM) AND STRETCHING EXERCISES - Biceps Tendon Disruption (Distal) Once your physician, physical therapist or athletic trainer has permitted you to discontinue using your brace or splint, you may begin to restore your elbow motion by using these exercises. Beginning these exercises before your provider's approval may result in delayed healing. While completing these exercises, remember:   Restoring tissue flexibility helps normal motion to return to the joints. This allows healthier, less painful movement and activity.  An effective stretch should be held for at least 30 seconds.  A stretch should never be painful. You should only feel a gentle lengthening or release in the stretched tissue. RANGE OF MOTION - Extension  Hold your right / left arm at your side and straighten your elbow as far as you can using your right / left arm muscles.  Straighten the right / left elbow farther by gently pushing down on your forearm until you feel a gentle stretch on the inside of your elbow. Hold this position for __________ seconds.  Slowly return to the starting position. Repeat __________ times. Complete this exercise __________ times per day.  RANGE OF MOTION - Flexion  Hold your right / left arm at your side and bend your elbow as far as you can using your right / left arm muscles.  Bend the right / left elbow farther by gently pushing up on your forearm until you feel a gentle stretch on the outside of your elbow. Hold this position for  __________ seconds.  Slowly return to the starting position. Repeat __________ times. Complete this exercise __________ times per day.  RANGE OF MOTION - Supination, Active   Stand or sit with your elbows at your side. Bend your right / left elbow to 90 degrees.  Turn your palm upward until you feel a gentle stretch on the inside of your forearm.  Hold this position for __________ seconds. Slowly release and return to the starting position. Repeat __________ times. Complete this stretch __________ times per day.  RANGE OF MOTION - Pronation, Active   Stand or sit with your elbows at your side. Bend your right / left elbow to 90 degrees.  Turn your palm downward until you feel a gentle stretch on the top of your forearm.  Hold this position for __________ seconds. Slowly release and return to the starting position. Repeat __________ times. Complete this stretch __________ times per day.  STRENGTHENING EXERCISES - Biceps Tendon Disruption (Distal) Once your physician, physical therapist, or athletic trainer has permitted you to discontinue using your brace or splint, you may begin restoring your arm strength by using these exercises. Beginning these before your provider's approval may result in delayed healing. While completing these exercises, remember:   Muscles can gain both the endurance and the strength needed for everyday activities through controlled exercises.  Complete these exercises as instructed by your physician,  physical therapist, or athletic trainer. Progress the resistance and repetitions only as guided.  You may experience muscle soreness or fatigue, but the pain or discomfort you are trying to eliminate should never worsen during these exercises. If this pain does worsen, stop and make certain you are following the directions exactly. If the pain is still present after adjustments, discontinue the exercise until you can discuss the trouble with your clinician. STRENGTH -  Elbow Flexors, Isometric   Stand or sit upright on a firm surface. Place your right / left arm so that your hand is palm-up and at the height of your waist.  Place your opposite hand on top of your forearm. Gently push down as your right / left arm resists. Push as hard as you can with both arms without causing any pain or movement at your right / left elbow. Hold this stationary position for __________ seconds.  Gradually release the tension in both arms. Allow your muscles to relax completely before repeating. Repeat __________ times. Complete this exercise __________ times per day. STRENGTH - Elbow Extensors, Isometric   Stand or sit upright on a firm surface. Place your right / left arm so that your palm faces your abdomen and it is at the height of your waist.  Place your opposite hand on the underside of your forearm. Gently push up as your right / left arm resists. Push as hard as you can with both arms without causing any pain or movement at your right / left elbow. Hold this stationary position for __________ seconds.  Gradually release the tension in both arms. Allow your muscles to relax completely before repeating. Repeat __________ times. Complete this exercise __________ times per day. STRENGTH - Elbow Flexors, Supinated  With good posture, stand or sit on a firm chair without armrests. Allow your right / left arm to rest at your side with your palm facing forward.  Holding a __________ weight or gripping a rubber exercise band/tubing, bring your right / left hand toward your shoulder.  Allow your muscles to control the resistance as your hand returns to your side. Repeat __________ times. Complete this exercise __________ times per day.  STRENGTH - Elbow Flexors, Neutral  With good posture, stand or sit on a firm chair without armrests. Allow your right / left arm to rest at your side with your thumb facing forward.  Holding a __________ weight or gripping a rubber exercise  band/tubing, bring your right / left hand toward your shoulder.  Allow your muscles to control the resistance as your hand returns to your side. Repeat __________ times. Complete this exercise __________ times per day.  STRENGTH - Elbow Extensors  Lie on your back. Extend your right / left elbow into the air, pointing it toward the ceiling. Brace your arm with your opposite hand.*  Holding a __________ weight in your hand, slowly straighten your right / left elbow.  Allow your muscles to control the weight as your hand returns to its starting position. Repeat __________ times. Complete this exercise __________ times per day. *You may also stand with your elbow overhead and pointed toward the ceiling and supported by your opposite hand. STRENGTH - Elbow Extensors, Dynamic  With good posture, stand or sit on a firm chair without armrests. Keeping your upper arms at your side, bring both hands up to your right / left shoulder while gripping a rubber exercise band/tubing. Your right / left hand should be just below the other hand.  Straighten your  right / left elbow. Hold for __________ seconds.  Allow your muscles to control the rubber exercise band/tubing as your hand returns to your shoulder. Repeat __________ times. Complete this exercise __________ times per day. STRENGTH - Forearm Supinators   Sit with your right / left forearm supported on a table, keeping your elbow below shoulder height. Rest your hand over the edge, palm down.  Gently grip a hammer or a soup ladle.  Without moving your elbow, slowly turn your palm and hand upward to a "thumbs-up" position.  Hold this position for __________ seconds. Slowly return to the starting position. Repeat __________ times. Complete this exercise __________ times per day.  STRENGTH - Forearm Pronators   Sit with your right / left forearm supported on a table, keeping your elbow below shoulder height. Rest your hand over the edge, palm  up.  Gently grip a hammer or a soup ladle.  Without moving your elbow, slowly turn your palm and hand upward to a "thumbs-up" position.  Hold this position for __________ seconds. Slowly return to the starting position. Repeat __________ times. Complete this exercise __________ times per day.  Document Released: 01/25/2005 Document Revised: 04/19/2011 Document Reviewed: 05/09/2008 [facility] Patient Information 2015 [place], [place]. This information is not intended to replace advice given to you by your health care provider. Make sure you discuss any questions you have with your health care provider.

## 2014-06-01 NOTE — Progress Notes (Signed)
[facility] [address] [place] [address] [phone]  Date:  06/01/2014   Name:  Robert Lynn   DOB:  04/12/1950   MRN:  [id number]  PCP:  No PCP Per Patient    Chief Complaint: Arm Injury   History of Present Illness:  Robert Lynn is a 64 y.o. very pleasant male patient who presents with the following:  Lifted a toolbox on 05/13/14 and felt pain in right upper arm Has a bulge in his distal upper arm Has been doing his usual work activities since the injury Little pain No disability No improvement with over the counter medications or other home remedies.  Denies other complaint or health concern today.   There are no active problems to display for this patient.   Past Medical History  Diagnosis Date  . Hypertension   . Allergy     Past Surgical History  Procedure Laterality Date  . Mitral valve repair  2002    History  Substance Use Topics  . Smoking status: Never Smoker   . Smokeless tobacco: Never Used  . Alcohol Use: No    No family history on file.  No Known Allergies  Medication list has been reviewed and updated.  No current outpatient prescriptions on file prior to visit.   No current facility-administered medications on file prior to visit.    Review of Systems:  As per HPI, otherwise negative.    Physical Examination: There were no vitals filed for this visit. There were no vitals filed for this visit. There is no height or weight on file to calculate BMI. Ideal Body Weight:    GEN: WDWN, NAD, Non-toxic, A & O x 3 HEENT: Atraumatic, Normocephalic. Neck supple. No masses, No LAD. Ears and Nose: No external deformity. CV: RRR, No M/G/R. No JVD. No thrill. No extra heart sounds. PULM: CTA B, no wheezes, crackles, rhonchi. No retractions. No resp. distress. No accessory muscle use. ABD: S, NT, ND, +BS. No rebound. No HSM. EXTR: No c/c/e  Right arm deformity on flexor surface consistent with biceps tendon  tear. NEURO Normal gait.  PSYCH: Normally interactive. Conversant. Not depressed or anxious appearing.  Calm demeanor.    Assessment and Plan: Biceps tendon tear Ortho Full duty  Signed,  Ellison Carwin, MD

## 2014-09-23 ENCOUNTER — Other Ambulatory Visit: Payer: Self-pay | Admitting: Urology

## 2014-09-23 DIAGNOSIS — C61 Malignant neoplasm of prostate: Secondary | ICD-10-CM

## 2014-10-31 ENCOUNTER — Ambulatory Visit (HOSPITAL_COMMUNITY)
Admission: RE | Admit: 2014-10-31 | Discharge: 2014-10-31 | Disposition: A | Discharge status: Home / Self Care | Payer: BLUE CROSS/BLUE SHIELD | Source: Ambulatory Visit | Attending: Urology | Admitting: Urology

## 2014-10-31 DIAGNOSIS — C61 Malignant neoplasm of prostate: Secondary | ICD-10-CM | POA: Insufficient documentation

## 2014-10-31 LAB — POCT I-STAT CREATININE: Creatinine, Ser: 0.9 mg/dL (ref 0.61–1.24)

## 2014-10-31 MED ORDER — GADOBENATE DIMEGLUMINE 529 MG/ML IV SOLN
15.0000 mL | Freq: Once | INTRAVENOUS | Status: AC | PRN
  Administered 2014-10-31: 13 mL via INTRAVENOUS

## 2016-01-27 ENCOUNTER — Other Ambulatory Visit: Payer: Self-pay | Admitting: Dermatology

## 2016-07-19 ENCOUNTER — Other Ambulatory Visit: Payer: Self-pay | Admitting: Internal Medicine

## 2016-07-19 ENCOUNTER — Ambulatory Visit
Admission: RE | Admit: 2016-07-19 | Discharge: 2016-07-19 | Disposition: A | Discharge status: Home / Self Care | Payer: BLUE CROSS/BLUE SHIELD | Source: Ambulatory Visit | Attending: Internal Medicine | Admitting: Internal Medicine

## 2016-07-19 DIAGNOSIS — M25512 Pain in left shoulder: Secondary | ICD-10-CM

## 2016-10-29 DIAGNOSIS — Z7901 Long term (current) use of anticoagulants: Secondary | ICD-10-CM | POA: Diagnosis not present

## 2016-11-01 IMAGING — MR MR PROSTATE WO/W CM
23 of 53 series · 23 of 53 positions shown · IV contrast (multihance)
Comparison: None.

CLINICAL DATA: Prostate cancer, Gleason score 6

EXAM:
MR PROSTATE WITHOUT AND WITH CONTRAST
TECHNIQUE: Multiplanar multisequence MRI images were obtained of the pelvis
centered about the prostate. Pre and post contrast images were
obtained.
CONTRAST:  13mL MULTIHANCE GADOBENATE DIMEGLUMINE 529 MG/ML IV SOLN

[Series 3: bSSFP fat-sat · axial · 6.0mm · 0.86mm/px · 1 of 44 slices shown]
[im 1/44]
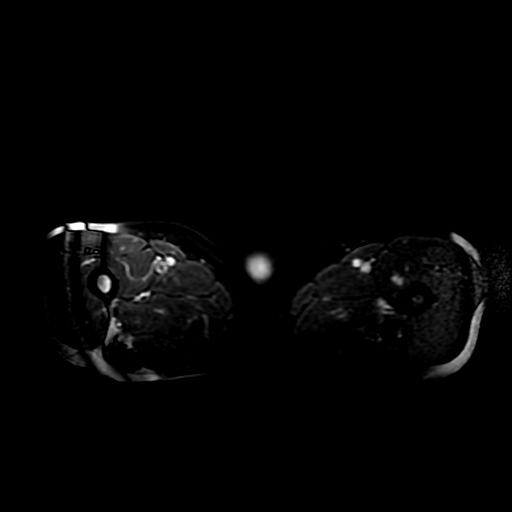

[Series 4: T1 · axial · 6.0mm · 0.86mm/px · 1 of 39 slices shown (1 of 2)]
[im 1/39]
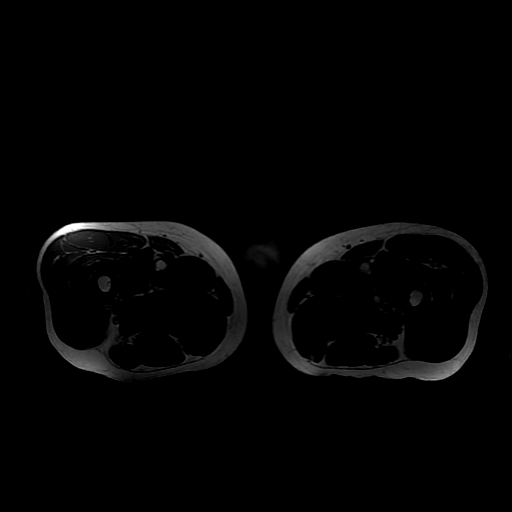

[Series 5: T2 · axial · 3.0mm · 0.29mm/px · 1 of 23 slices shown (1 of 3)]
[im 1/23]
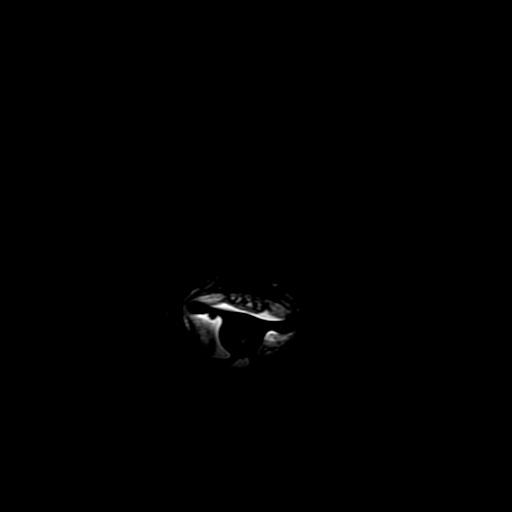

[Series 6: T1 · axial · 3.0mm · 0.29mm/px · 1 of 23 slices shown (2 of 2)]
[im 1/23]
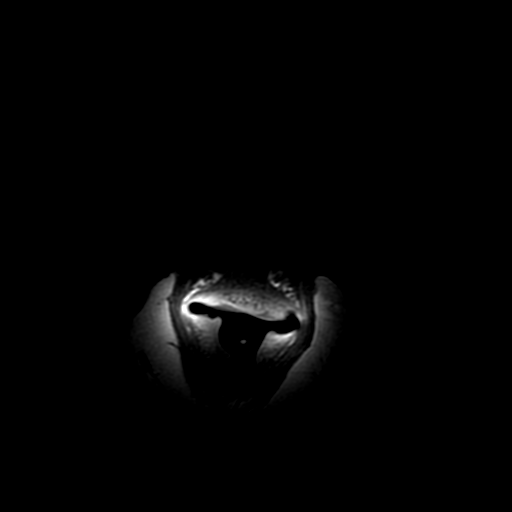

[Series 7: T2 · sagittal · 4.0mm · 0.29mm/px · 1 of 23 slices shown (2 of 3)]
[im 1/23]
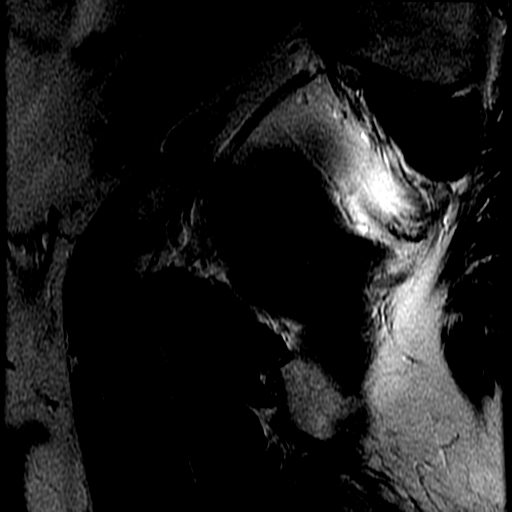

[Series 8: T2 · coronal · 4.0mm · 0.29mm/px · 1 of 19 slices shown (3 of 3)]
[im 1/19]
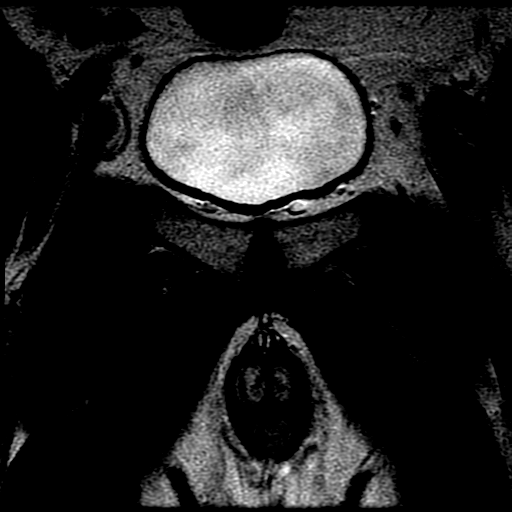

[Series 9: DWI · axial · 3.0mm · 0.59mm/px · 1 of 46 slices shown (1 of 6)]
[im 1/46]
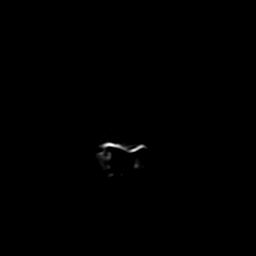

[Series 10: DWI · axial · 3.0mm · 0.59mm/px · 1 of 46 slices shown (2 of 6)]
[im 1/46]
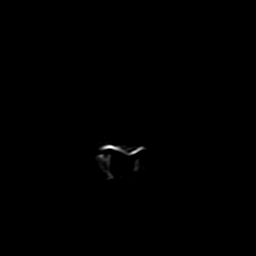

[Series 11: DWI · axial · 3.0mm · 0.59mm/px · 1 of 45 slices shown (3 of 6)]
[im 1/45]
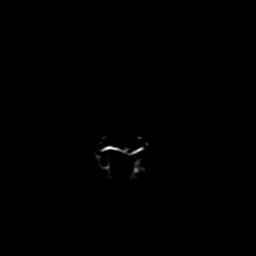

[Series 900: DWI · axial · 3.0mm · 0.59mm/px · 1 of 23 slices shown (4 of 6)]
[im 1/23]
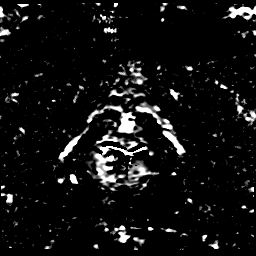

[Series 1000: DWI · axial · 3.0mm · 0.59mm/px · 1 of 23 slices shown (5 of 6)]
[im 1/23]
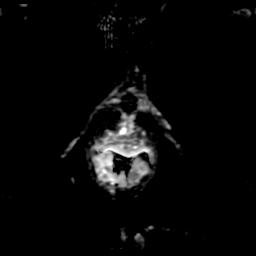

[Series 1100: DWI · axial · 3.0mm · 0.59mm/px · 1 of 23 slices shown (6 of 6)]
[im 1/23]
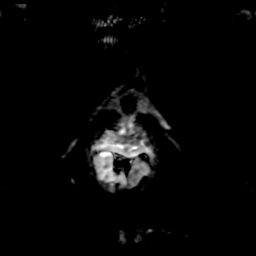

[((id)/(id)/1)-((id)/(id)/1) · axial · 3.0mm · 0.43mm/px · 1 of 68 slices shown (1 of 11)]
[im 1/68]
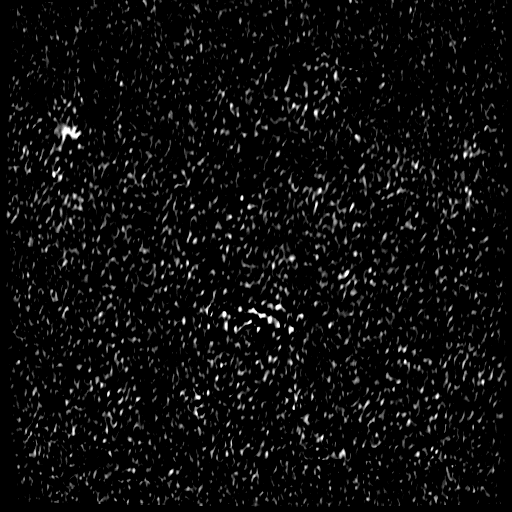

[((id)/(id)/1)-((id)/(id)/1) · axial · 3.0mm · 0.43mm/px · 1 of 68 slices shown (2 of 11)]
[im 1/68]
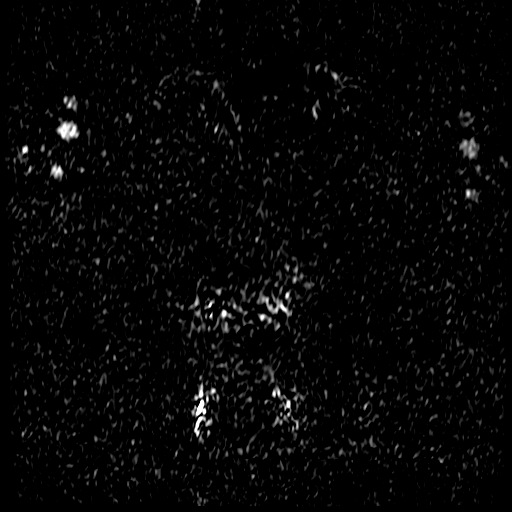

[((id)/(id)/1)-((id)/(id)/1) · axial · 3.0mm · 0.43mm/px · 1 of 68 slices shown (3 of 11)]
[im 1/68]
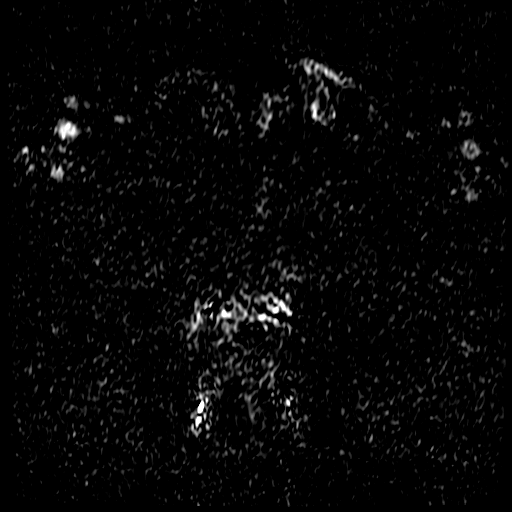

[((id)/(id)/1)-((id)/(id)/1) · axial · 3.0mm · 0.43mm/px · 1 of 68 slices shown (4 of 11)]
[im 1/68]
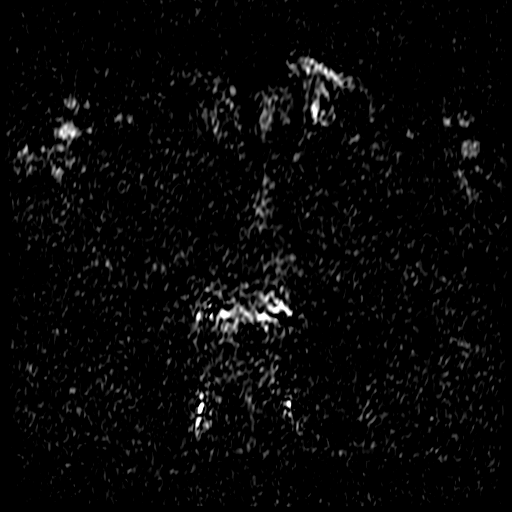

[((id)/(id)/1)-((id)/(id)/1) · axial · 3.0mm · 0.43mm/px · 1 of 68 slices shown (5 of 11)]
[im 1/68]
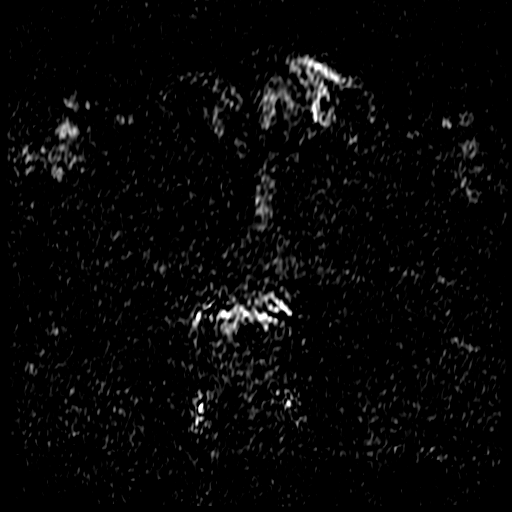

[((id)/(id)/1)-((id)/(id)/1) · axial · 3.0mm · 0.43mm/px · 1 of 68 slices shown (6 of 11)]
[im 1/68]
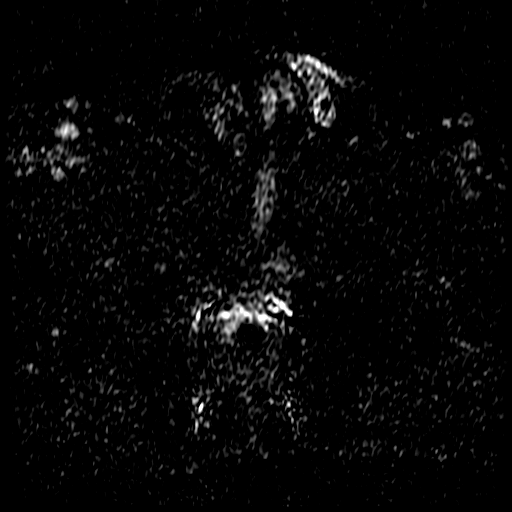

[((id)/(id)/1)-((id)/(id)/1) · axial · 3.0mm · 0.43mm/px · 1 of 68 slices shown (7 of 11)]
[im 1/68]
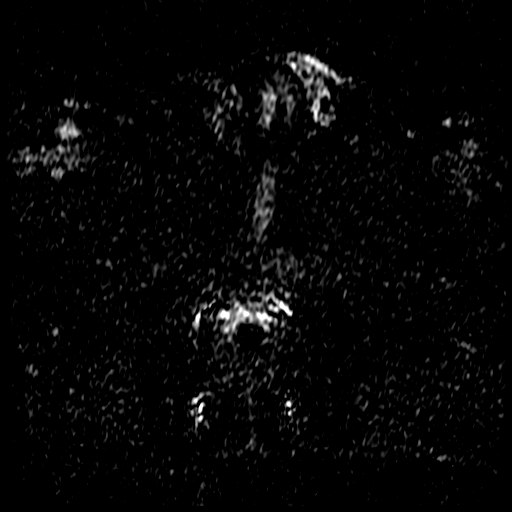

[((id)/(id)/1)-((id)/(id)/1) · axial · 3.0mm · 0.43mm/px · 1 of 68 slices shown (8 of 11)]
[im 1/68]
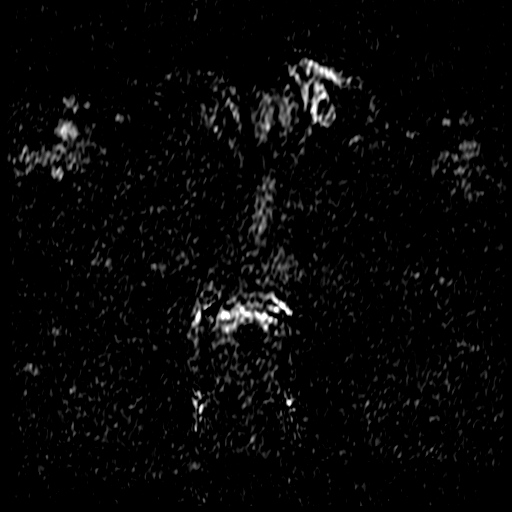

[((id)/(id)/1)-((id)/(id)/1) · axial · 3.0mm · 0.43mm/px · 1 of 68 slices shown (9 of 11)]
[im 1/68]
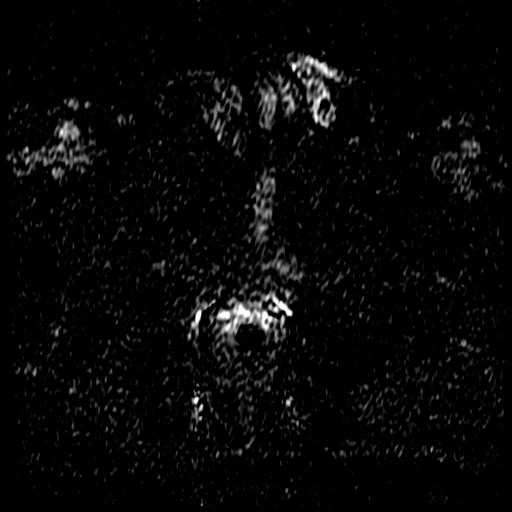

[((id)/(id)/1)-((id)/(id)/1) · axial · 3.0mm · 0.43mm/px · 1 of 68 slices shown (10 of 11)]
[im 1/68]
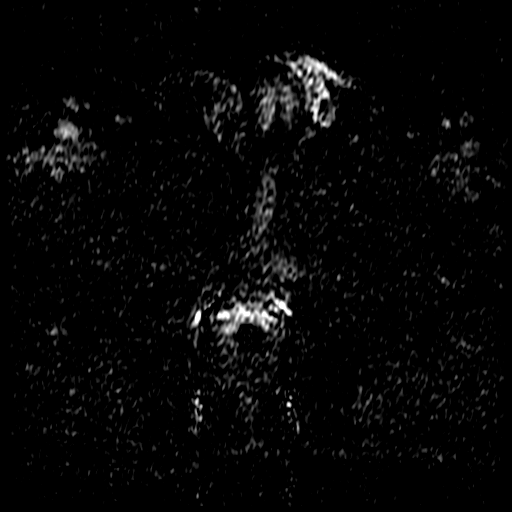

[((id)/(id)/1)-((id)/(id)/1) · axial · 3.0mm · 0.43mm/px · 1 of 68 slices shown (11 of 11)]
[im 1/68]
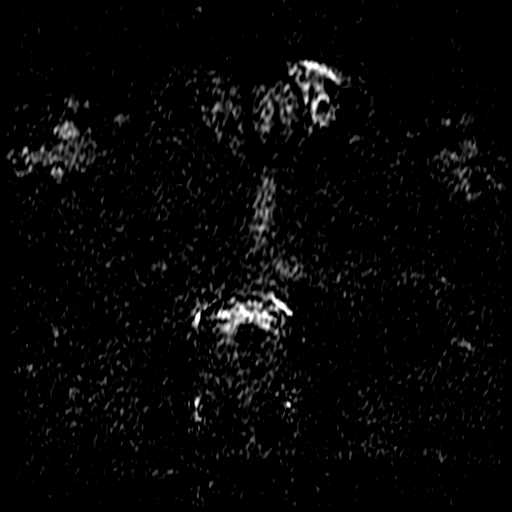

[23 of 53 positions shown; findings below may reference images not displayed]

FINDINGS: Prostate: 10 x 6 mm focus of vague low T2 signal in the
posterolateral left peripheral mid gland to apex (series 5/image
16), with suspected early arterial enhancement (series 6925/image
36), but without convincing low ADC/restricted diffusion (series
2666/image 8). This likely corresponds to patient's known low-grade
macroscopic prostate cancer.

5 x 4 mm focus of low T2 signal in the posterolateral right mid
peripheral gland (series 5/image 15), with suspected early arterial
enhancement (series 6925/image 37), but without restricted
diffusion. This appearance is indeterminate but worrisome for
additional focus of possible low-grade macroscopic prostate cancer.

No findings specific for high-grade macroscopic prostate cancer.

Enlargement/nodularity of the central gland, which indents the base
of the bladder, suggesting BPH. No suspicious central gland nodules
on T2.

Transcapsular spread:  Absent.

Seminal vesicle involvement: Absent.

Neurovascular bundle involvement: Absent.

Pelvic adenopathy: Absent.

Bone metastasis: Absent.

Other findings: None.
IMPRESSION: 10 mm lesion in the left peripheral mid gland to apex, corresponding
to known are low-grade macroscopic prostate cancer.

5 mm lesion in the right peripheral mid gland, indeterminate but
worrisome for possible low-grade macroscopic prostate cancer.

No evidence of extracapsular extension, seminal vesicle invasion, or
metastatic disease.

## 2016-11-26 DIAGNOSIS — Z7901 Long term (current) use of anticoagulants: Secondary | ICD-10-CM | POA: Diagnosis not present

## 2016-12-24 DIAGNOSIS — Z7901 Long term (current) use of anticoagulants: Secondary | ICD-10-CM | POA: Diagnosis not present

## 2017-01-14 DIAGNOSIS — Z23 Encounter for immunization: Secondary | ICD-10-CM | POA: Diagnosis not present

## 2017-01-14 DIAGNOSIS — E785 Hyperlipidemia, unspecified: Secondary | ICD-10-CM | POA: Diagnosis not present

## 2017-01-14 DIAGNOSIS — Z7901 Long term (current) use of anticoagulants: Secondary | ICD-10-CM | POA: Diagnosis not present

## 2017-01-14 DIAGNOSIS — I10 Essential (primary) hypertension: Secondary | ICD-10-CM | POA: Diagnosis not present

## 2017-01-14 DIAGNOSIS — Z0000 Encounter for general adult medical examination without abnormal findings: Secondary | ICD-10-CM | POA: Diagnosis not present

## 2017-02-11 DIAGNOSIS — Z7901 Long term (current) use of anticoagulants: Secondary | ICD-10-CM | POA: Diagnosis not present

## 2017-03-11 DIAGNOSIS — Z7901 Long term (current) use of anticoagulants: Secondary | ICD-10-CM | POA: Diagnosis not present

## 2017-04-08 DIAGNOSIS — Z7901 Long term (current) use of anticoagulants: Secondary | ICD-10-CM | POA: Diagnosis not present

## 2017-04-22 DIAGNOSIS — Z7901 Long term (current) use of anticoagulants: Secondary | ICD-10-CM | POA: Diagnosis not present

## 2017-05-20 DIAGNOSIS — Z7901 Long term (current) use of anticoagulants: Secondary | ICD-10-CM | POA: Diagnosis not present

## 2017-06-17 DIAGNOSIS — Z7901 Long term (current) use of anticoagulants: Secondary | ICD-10-CM | POA: Diagnosis not present

## 2017-07-15 DIAGNOSIS — Z7901 Long term (current) use of anticoagulants: Secondary | ICD-10-CM | POA: Diagnosis not present

## 2017-08-19 DIAGNOSIS — Z7901 Long term (current) use of anticoagulants: Secondary | ICD-10-CM | POA: Diagnosis not present

## 2017-09-16 DIAGNOSIS — Z7901 Long term (current) use of anticoagulants: Secondary | ICD-10-CM | POA: Diagnosis not present

## 2017-10-14 DIAGNOSIS — Z7901 Long term (current) use of anticoagulants: Secondary | ICD-10-CM | POA: Diagnosis not present

## 2017-10-21 DIAGNOSIS — H903 Sensorineural hearing loss, bilateral: Secondary | ICD-10-CM | POA: Diagnosis not present

## 2017-11-11 DIAGNOSIS — Z7901 Long term (current) use of anticoagulants: Secondary | ICD-10-CM | POA: Diagnosis not present

## 2018-01-30 DIAGNOSIS — Z23 Encounter for immunization: Secondary | ICD-10-CM | POA: Diagnosis not present

## 2018-01-30 DIAGNOSIS — I10 Essential (primary) hypertension: Secondary | ICD-10-CM | POA: Diagnosis not present

## 2018-01-30 DIAGNOSIS — Z1159 Encounter for screening for other viral diseases: Secondary | ICD-10-CM | POA: Diagnosis not present

## 2018-01-30 DIAGNOSIS — Z0000 Encounter for general adult medical examination without abnormal findings: Secondary | ICD-10-CM | POA: Diagnosis not present

## 2018-01-30 DIAGNOSIS — C61 Malignant neoplasm of prostate: Secondary | ICD-10-CM | POA: Diagnosis not present

## 2018-01-30 DIAGNOSIS — Z7901 Long term (current) use of anticoagulants: Secondary | ICD-10-CM | POA: Diagnosis not present

## 2018-01-30 DIAGNOSIS — E785 Hyperlipidemia, unspecified: Secondary | ICD-10-CM | POA: Diagnosis not present

## 2018-04-07 DIAGNOSIS — Z7901 Long term (current) use of anticoagulants: Secondary | ICD-10-CM | POA: Diagnosis not present

## 2018-04-12 DIAGNOSIS — Z7901 Long term (current) use of anticoagulants: Secondary | ICD-10-CM | POA: Diagnosis not present

## 2018-04-14 DIAGNOSIS — Z1211 Encounter for screening for malignant neoplasm of colon: Secondary | ICD-10-CM | POA: Diagnosis not present

## 2018-05-01 DIAGNOSIS — N401 Enlarged prostate with lower urinary tract symptoms: Secondary | ICD-10-CM | POA: Diagnosis not present

## 2018-05-01 DIAGNOSIS — R3912 Poor urinary stream: Secondary | ICD-10-CM | POA: Diagnosis not present

## 2018-05-05 DIAGNOSIS — Z0000 Encounter for general adult medical examination without abnormal findings: Secondary | ICD-10-CM | POA: Diagnosis not present

## 2018-05-10 DIAGNOSIS — R3912 Poor urinary stream: Secondary | ICD-10-CM | POA: Diagnosis not present

## 2018-05-10 DIAGNOSIS — N401 Enlarged prostate with lower urinary tract symptoms: Secondary | ICD-10-CM | POA: Diagnosis not present

## 2018-05-10 DIAGNOSIS — C61 Malignant neoplasm of prostate: Secondary | ICD-10-CM | POA: Diagnosis not present

## 2018-05-10 DIAGNOSIS — N5202 Corporo-venous occlusive erectile dysfunction: Secondary | ICD-10-CM | POA: Diagnosis not present

## 2018-05-15 DIAGNOSIS — Z7901 Long term (current) use of anticoagulants: Secondary | ICD-10-CM | POA: Diagnosis not present

## 2018-08-04 DIAGNOSIS — Z7901 Long term (current) use of anticoagulants: Secondary | ICD-10-CM | POA: Diagnosis not present

## 2018-08-04 DIAGNOSIS — K219 Gastro-esophageal reflux disease without esophagitis: Secondary | ICD-10-CM | POA: Diagnosis not present

## 2018-08-04 DIAGNOSIS — E785 Hyperlipidemia, unspecified: Secondary | ICD-10-CM | POA: Diagnosis not present

## 2018-08-04 DIAGNOSIS — I10 Essential (primary) hypertension: Secondary | ICD-10-CM | POA: Diagnosis not present

## 2018-08-31 DIAGNOSIS — Z7901 Long term (current) use of anticoagulants: Secondary | ICD-10-CM | POA: Diagnosis not present

## 2018-10-11 DIAGNOSIS — Z7901 Long term (current) use of anticoagulants: Secondary | ICD-10-CM | POA: Diagnosis not present

## 2018-11-03 DIAGNOSIS — R3912 Poor urinary stream: Secondary | ICD-10-CM | POA: Diagnosis not present

## 2018-11-03 DIAGNOSIS — N401 Enlarged prostate with lower urinary tract symptoms: Secondary | ICD-10-CM | POA: Diagnosis not present

## 2018-11-10 DIAGNOSIS — N401 Enlarged prostate with lower urinary tract symptoms: Secondary | ICD-10-CM | POA: Diagnosis not present

## 2018-11-10 DIAGNOSIS — C61 Malignant neoplasm of prostate: Secondary | ICD-10-CM | POA: Diagnosis not present

## 2018-11-10 DIAGNOSIS — Z23 Encounter for immunization: Secondary | ICD-10-CM | POA: Diagnosis not present

## 2018-11-10 DIAGNOSIS — Z7901 Long term (current) use of anticoagulants: Secondary | ICD-10-CM | POA: Diagnosis not present

## 2018-11-10 DIAGNOSIS — R3121 Asymptomatic microscopic hematuria: Secondary | ICD-10-CM | POA: Diagnosis not present

## 2018-11-10 DIAGNOSIS — R3912 Poor urinary stream: Secondary | ICD-10-CM | POA: Diagnosis not present

## 2018-12-11 DIAGNOSIS — Z7901 Long term (current) use of anticoagulants: Secondary | ICD-10-CM | POA: Diagnosis not present

## 2019-02-16 DIAGNOSIS — Z0000 Encounter for general adult medical examination without abnormal findings: Secondary | ICD-10-CM | POA: Diagnosis not present

## 2019-02-16 DIAGNOSIS — Z79899 Other long term (current) drug therapy: Secondary | ICD-10-CM | POA: Diagnosis not present

## 2019-02-16 DIAGNOSIS — I10 Essential (primary) hypertension: Secondary | ICD-10-CM | POA: Diagnosis not present

## 2019-03-07 DIAGNOSIS — Z7901 Long term (current) use of anticoagulants: Secondary | ICD-10-CM | POA: Diagnosis not present

## 2019-03-28 DIAGNOSIS — Z7901 Long term (current) use of anticoagulants: Secondary | ICD-10-CM | POA: Diagnosis not present

## 2019-03-28 DIAGNOSIS — Z1159 Encounter for screening for other viral diseases: Secondary | ICD-10-CM | POA: Diagnosis not present

## 2019-04-02 DIAGNOSIS — Z1211 Encounter for screening for malignant neoplasm of colon: Secondary | ICD-10-CM | POA: Diagnosis not present

## 2019-04-09 ENCOUNTER — Other Ambulatory Visit: Payer: Self-pay | Admitting: Urology

## 2019-04-09 DIAGNOSIS — C61 Malignant neoplasm of prostate: Secondary | ICD-10-CM

## 2019-04-26 ENCOUNTER — Ambulatory Visit: Payer: BLUE CROSS/BLUE SHIELD | Attending: Internal Medicine

## 2019-04-26 DIAGNOSIS — Z23 Encounter for immunization: Secondary | ICD-10-CM

## 2019-04-26 NOTE — Progress Notes (Signed)
   Covid-19 Vaccination Clinic  Name:  MANDY ENGLIN    MRN: [id number] DOB: March 20, 1950  04/26/2019  Mr. Roets was observed post Covid-19 immunization for 15 minutes without incident. He was provided with Vaccine Information Sheet and instruction to access the V-Safe system.   Mr. Enwright was instructed to call 911 with any severe reactions post vaccine: Marland Kitchen Difficulty breathing  . Swelling of face and throat  . A fast heartbeat  . A bad rash all over body  . Dizziness and weakness   Immunizations Administered    Name Date Dose VIS Date Route   Pfizer COVID-19 Vaccine 04/26/2019  9:07 AM 0.3 mL 01/19/2019 Intramuscular   Manufacturer: [place]   Lot: [id number]   [place]: [id number]

## 2019-05-05 ENCOUNTER — Ambulatory Visit
Admission: RE | Admit: 2019-05-05 | Discharge: 2019-05-05 | Disposition: A | Discharge status: Home / Self Care | Payer: BC Managed Care – PPO | Source: Ambulatory Visit | Attending: Urology | Admitting: Urology

## 2019-05-05 ENCOUNTER — Other Ambulatory Visit: Payer: Self-pay

## 2019-05-05 DIAGNOSIS — C61 Malignant neoplasm of prostate: Secondary | ICD-10-CM

## 2019-05-18 DIAGNOSIS — J4520 Mild intermittent asthma, uncomplicated: Secondary | ICD-10-CM | POA: Diagnosis not present

## 2019-05-18 DIAGNOSIS — Z7901 Long term (current) use of anticoagulants: Secondary | ICD-10-CM | POA: Diagnosis not present

## 2019-05-18 DIAGNOSIS — I10 Essential (primary) hypertension: Secondary | ICD-10-CM | POA: Diagnosis not present

## 2019-05-18 DIAGNOSIS — C61 Malignant neoplasm of prostate: Secondary | ICD-10-CM | POA: Diagnosis not present

## 2019-05-22 ENCOUNTER — Ambulatory Visit: Payer: BC Managed Care – PPO | Attending: Internal Medicine

## 2019-05-22 DIAGNOSIS — Z23 Encounter for immunization: Secondary | ICD-10-CM

## 2019-05-22 NOTE — Progress Notes (Signed)
   Covid-19 Vaccination Clinic  Name:  Robert Lynn    MRN: [id number] DOB: 20-Sep-1950  05/22/2019  [name] was observed post Covid-19 immunization for 15 minutes without incident. He was provided with Vaccine Information Sheet and instruction to access the V-Safe system.   [name] was instructed to call 911 with any severe reactions post vaccine: Marland Kitchen Difficulty breathing  . Swelling of face and throat  . A fast heartbeat  . A bad rash all over body  . Dizziness and weakness   Immunizations Administered    Name Date Dose VIS Date Route   Pfizer COVID-19 Vaccine 05/22/2019  4:33 PM 0.3 mL 01/19/2019 Intramuscular   Manufacturer: [place]   Lot: [id number]   [place]: [id number]

## 2019-06-08 ENCOUNTER — Other Ambulatory Visit: Payer: Self-pay | Admitting: Urology

## 2019-06-08 DIAGNOSIS — C61 Malignant neoplasm of prostate: Secondary | ICD-10-CM

## 2019-06-09 ENCOUNTER — Ambulatory Visit
Admission: RE | Admit: 2019-06-09 | Discharge: 2019-06-09 | Disposition: A | Discharge status: Home / Self Care | Payer: BC Managed Care – PPO | Source: Ambulatory Visit | Attending: Urology | Admitting: Urology

## 2019-06-09 ENCOUNTER — Other Ambulatory Visit: Payer: BC Managed Care – PPO

## 2019-06-09 ENCOUNTER — Other Ambulatory Visit: Payer: Self-pay

## 2019-06-09 DIAGNOSIS — R972 Elevated prostate specific antigen [PSA]: Secondary | ICD-10-CM | POA: Diagnosis not present

## 2019-06-09 DIAGNOSIS — C61 Malignant neoplasm of prostate: Secondary | ICD-10-CM

## 2019-06-09 MED ORDER — GADOBENATE DIMEGLUMINE 529 MG/ML IV SOLN
13.0000 mL | Freq: Once | INTRAVENOUS | Status: AC | PRN
  Administered 2019-06-09: 13 mL via INTRAVENOUS

## 2019-06-15 DIAGNOSIS — Z7901 Long term (current) use of anticoagulants: Secondary | ICD-10-CM | POA: Diagnosis not present

## 2019-06-18 DIAGNOSIS — C61 Malignant neoplasm of prostate: Secondary | ICD-10-CM | POA: Diagnosis not present

## 2019-06-18 DIAGNOSIS — Z8042 Family history of malignant neoplasm of prostate: Secondary | ICD-10-CM | POA: Diagnosis not present

## 2019-06-22 DIAGNOSIS — Z7901 Long term (current) use of anticoagulants: Secondary | ICD-10-CM | POA: Diagnosis not present

## 2019-07-19 DIAGNOSIS — Z7901 Long term (current) use of anticoagulants: Secondary | ICD-10-CM | POA: Diagnosis not present

## 2019-09-12 DIAGNOSIS — D6859 Other primary thrombophilia: Secondary | ICD-10-CM | POA: Diagnosis not present

## 2019-10-18 DIAGNOSIS — T185XXA Foreign body in anus and rectum, initial encounter: Secondary | ICD-10-CM | POA: Diagnosis not present

## 2019-10-18 DIAGNOSIS — X58XXXA Exposure to other specified factors, initial encounter: Secondary | ICD-10-CM | POA: Diagnosis not present

## 2019-10-18 DIAGNOSIS — S30850A Superficial foreign body of lower back and pelvis, initial encounter: Secondary | ICD-10-CM | POA: Diagnosis not present

## 2019-12-19 DIAGNOSIS — Z7901 Long term (current) use of anticoagulants: Secondary | ICD-10-CM | POA: Diagnosis not present

## 2019-12-19 DIAGNOSIS — C61 Malignant neoplasm of prostate: Secondary | ICD-10-CM | POA: Diagnosis not present

## 2019-12-21 DIAGNOSIS — N401 Enlarged prostate with lower urinary tract symptoms: Secondary | ICD-10-CM | POA: Diagnosis not present

## 2019-12-21 DIAGNOSIS — R351 Nocturia: Secondary | ICD-10-CM | POA: Diagnosis not present

## 2019-12-21 DIAGNOSIS — C61 Malignant neoplasm of prostate: Secondary | ICD-10-CM | POA: Diagnosis not present

## 2019-12-21 DIAGNOSIS — N5201 Erectile dysfunction due to arterial insufficiency: Secondary | ICD-10-CM | POA: Diagnosis not present

## 2020-01-15 DIAGNOSIS — Z7901 Long term (current) use of anticoagulants: Secondary | ICD-10-CM | POA: Diagnosis not present

## 2020-02-11 DIAGNOSIS — Z7901 Long term (current) use of anticoagulants: Secondary | ICD-10-CM | POA: Diagnosis not present

## 2020-02-19 DIAGNOSIS — E785 Hyperlipidemia, unspecified: Secondary | ICD-10-CM | POA: Diagnosis not present

## 2020-02-19 DIAGNOSIS — Z23 Encounter for immunization: Secondary | ICD-10-CM | POA: Diagnosis not present

## 2020-02-19 DIAGNOSIS — Z0000 Encounter for general adult medical examination without abnormal findings: Secondary | ICD-10-CM | POA: Diagnosis not present

## 2020-03-04 ENCOUNTER — Other Ambulatory Visit: Payer: Self-pay

## 2020-03-04 ENCOUNTER — Ambulatory Visit: Payer: BC Managed Care – PPO | Admitting: Cardiology

## 2020-03-04 ENCOUNTER — Encounter: Payer: Self-pay | Admitting: Cardiology

## 2020-03-04 VITALS — BP 152/88 | HR 79 | Temp 97.9°F | Resp 16 | Ht 63.0 in | Wt 152.0 lb

## 2020-03-04 DIAGNOSIS — Z9889 Other specified postprocedural states: Secondary | ICD-10-CM | POA: Diagnosis not present

## 2020-03-04 DIAGNOSIS — Z8679 Personal history of other diseases of the circulatory system: Secondary | ICD-10-CM

## 2020-03-04 DIAGNOSIS — Z7901 Long term (current) use of anticoagulants: Secondary | ICD-10-CM

## 2020-03-04 DIAGNOSIS — Z8673 Personal history of transient ischemic attack (TIA), and cerebral infarction without residual deficits: Secondary | ICD-10-CM | POA: Diagnosis not present

## 2020-03-04 DIAGNOSIS — E7800 Pure hypercholesterolemia, unspecified: Secondary | ICD-10-CM | POA: Diagnosis not present

## 2020-03-04 DIAGNOSIS — I10 Essential (primary) hypertension: Secondary | ICD-10-CM

## 2020-03-04 NOTE — Progress Notes (Addendum)
Date:  03/04/2020   ID:  Robert Lynn, DOB 18-Feb-1950, MRN [id number]  PCP:  Leeroy Cha, MD  Cardiologist:  Rex Kras, DO, [facility] (established care 03/04/2020) Former Cardiology Providers: Dr. Golden Hurter Cardiothoracic Surgeon: Dr. Ricard Dillon  REASON FOR CONSULT: History of mitral valve repair.   REQUESTING PHYSICIAN:  Leeroy Cha, MD [address] [place],   [address]  Chief Complaint  Patient presents with  . History of mitral valve repair   . New Patient (Initial Visit)    HPI  Robert Lynn is a 70 y.o. male who presents to the office with a chief complaint of " history of mitral valve repair and reevaluation of anticoagulation." Patient's past medical history and cardiovascular risk factors include: Prostate cancer, dyslipidemia, Hx of melanoma, GERD, Hx of MRSA, HTN, history of CVA, Hx of MVP diagnosed in 1990 and subsequently underwent Mitral Valve Repair (2002), Long-term oral anticoagulation (since 2012 after CVA), advanced age.  He is referred to the office at the request of Leeroy Cha,* for evaluation of history of mitral valve repair and currently on oral anticoagulation.  Patient states that he went in for yearly physical with his PCP and he is currently on Coumadin.  And given his history of mitral valve repair the need for oral anticoagulation was questioned and therefore he is referred to cardiology for further evaluation and management.  Patient states that his INRs are currently managed by Dr. Fara Olden.  No history of intracranial bleeding or gastrointestinal bleeding.  No recent falls.  It appears the patient had a mitral valve prolapse diagnosed in 1990 that was being followed and subsequently underwent mitral valve repair with Dr. Ricard Dillon in the year 2002.  He was being followed by Dr. Golden Hurter.  Patient does not recall the last time he was evaluated by a cardiologist or had a echocardiogram status post valve  repair.  Based on the discharge summary from 2012 patient presented to [facility] ER room with symptoms of expressive aphasia and was transferred to [facility] for further evaluation of stroke.  He underwent MRI/MRA which revealed acute infarction involving the left hemisphere affecting the insular cortex, parietal, and posterior temporal lobes and the underlying white matter.  He was also noted to have remote infarcts of the right parietal and left parietal lobes per report.  He underwent extensive cardiovascular testing including TTE, TEE, carotid duplex.    Based on the discharge summary, patient was started on anticoagulated with Coumadin as he had 3 different infarcts involving more than 1 vascular territories.  Since then patient has been on Coumadin and does not endorse any evidence of bleeding or absolute contraindications.  Please refer to the discharge summary that is available on epic dating 11/20/2010.   FUNCTIONAL STATUS: Goes to gym regularly with resistance training and cardio. Used to do 5 K prior to COVID pandemic.    ALLERGIES: No Known Allergies  MEDICATION LIST PRIOR TO VISIT: Current Meds  Medication Sig  . amLODipine (NORVASC) 5 MG tablet Take 5 mg by mouth daily.  . beclomethasone (QVAR) 80 MCG/ACT inhaler Inhale 1 puff into the lungs 2 (two) times daily.  . fluticasone (FLONASE) 50 MCG/ACT nasal spray Place 2 sprays into both nostrils daily.  . hydrochlorothiazide (HYDRODIURIL) 25 MG tablet Take 25 mg by mouth daily.  Marland Kitchen omeprazole (PRILOSEC) 20 MG capsule Take 20 mg by mouth daily.  . ramipril (ALTACE) 10 MG capsule Take 10 mg by mouth daily.  Marland Kitchen  rosuvastatin (CRESTOR) 5 MG tablet Take 5 mg by mouth daily.  Marland Kitchen warfarin (COUMADIN) 5 MG tablet Take 5 mg by mouth daily.     PAST MEDICAL HISTORY: Past Medical History:  Diagnosis Date  . Allergy   . Heart murmur   . Hyperlipidemia   . Hypertension   . Stroke [facility])     PAST SURGICAL  HISTORY: Past Surgical History:  Procedure Laterality Date  . MITRAL VALVE REPAIR  2002    FAMILY HISTORY: The patient family history includes Dementia in his mother; Emphysema in his father; Snoring in his father. He was adopted.  SOCIAL HISTORY:  The patient  reports that he has never smoked. He has never used smokeless tobacco. He reports that he does not drink alcohol and does not use drugs.  REVIEW OF SYSTEMS: Review of Systems  Constitutional: Negative for chills and fever.  HENT: Negative for hoarse voice and nosebleeds.   Eyes: Negative for discharge, double vision and pain.  Cardiovascular: Negative for chest pain, claudication, dyspnea on exertion, leg swelling, near-syncope, orthopnea, palpitations, paroxysmal nocturnal dyspnea and syncope.  Respiratory: Negative for hemoptysis and shortness of breath.   Musculoskeletal: Negative for muscle cramps and myalgias.  Gastrointestinal: Negative for abdominal pain, constipation, diarrhea, hematemesis, hematochezia, melena, nausea and vomiting.  Neurological: Negative for dizziness and light-headedness.    PHYSICAL EXAM: Vitals with BMI 03/04/2020  Height 5' 3"  Weight 152 lbs  BMI 95.18  Systolic 841  Diastolic 88  Pulse 79    CONSTITUTIONAL: Well-developed and well-nourished. No acute distress.  SKIN: Skin is warm and dry. No rash noted. No cyanosis. No pallor. No jaundice HEAD: Normocephalic and atraumatic.  EYES: No scleral icterus MOUTH/THROAT: Moist oral membranes.  NECK: No JVD present. No thyromegaly noted. No carotid bruits  LYMPHATIC: No visible cervical adenopathy.  CHEST Normal respiratory effort. No intercostal retractions  LUNGS: Clear to auscultation bilaterally. No stridor. No wheezes. No rales.  CARDIOVASCULAR: Regular rate and rhythm, positive S1-S2, no murmurs rubs or gallops appreciated. ABDOMINAL: soft, nontender, nondistended, positive bowel sounds all 4 quadrants. No apparent ascites.   EXTREMITIES: No peripheral edema  HEMATOLOGIC: No significant bruising NEUROLOGIC: Oriented to person, place, and time. Nonfocal. Normal muscle tone.  PSYCHIATRIC: Normal mood and affect. Normal behavior. Cooperative  RADIOLOGY: MRI /MRA Head and Neck w/ w/o contrast: 11/19/2010: Moderate sized acute left hemisphere infarct affects the insular cortex, parietal and posterior temporal lobes, and underlying white matter.  Remote infarcts of the right parietal and left parietal lobes are observed.  There is also a small remote right posterior frontal infarct. Remote cerebellar lacunar infarcts are visualized. Brainstem is spared. Mild irregularity distal MCA and PCA branches consistent with intracranial atherosclerotic disease. No proximal stenosis.   CARDIAC DATABASE: Mitral valve repair:  S/P Median sternotomy for mitral valve repair (quadrangular resection of posterior leaflet, chordal transfer from posterior to anterior leaflet x 2, artificial chordal replacement x 3, #25 Carpentier-Edwards ring annuloplasty) by Dr. Darylene Price.   EKG: 03/04/2020: Normal sinus rhythm, 74 bpm, without underlying injury pattern.  Echocardiogram: 11/20/2010 transesophageal echocardiogram at [facility]:  LVEF 60-65%, no regional wall motion abnormality. Trivial AR. Annular ring prosthesis present and functioning normally with trivial regurgitation. Atrial septum: No defect or PFO was identified.  Stress Testing: No results found for this or any previous visit from the past 1095 days.  Heart Catheterization: May 2012: IMPRESSION: 1. Essentially normal coronary arteries. 2. Normal left ventricular function. 3. Dilated left atrium. 4. Severe mitral  regurgitation with anterior posterior mitral valve prolapse. 5. Normal pulmonary pressure. 6. Normal cardiac indices. 7. Normal oxygen saturation.  LABORATORY DATA: External Labs:  Collected: 02/20/2020 provided by PCP  Creatinine 0.92  mg/dL. eGFR: 82 mL/min per 1.73 m Lipid profile: Total cholesterol 171, triglycerides 166, HDL 52, LDL 90, non-HDL 119 AST 27, ALT 21, alkaline phosphatase 65 Hemoglobin 14.3 g/dL, hematocrit 44.4%  IMPRESSION:    ICD-10-CM   1. H/O mitral valve repair  Z98.890 EKG 12-Lead    ECHOCARDIOGRAM COMPLETE    PCV CARDIAC STRESS TEST    SARS-COV-2 RNA,(COVID-19) QUAL NAAT  2. History of mitral valve prolapse  Z86.79 ECHOCARDIOGRAM COMPLETE    PCV CARDIAC STRESS TEST  3. Long term (current) use of anticoagulants  Z79.01   4. History of stroke  Z86.73   5. Benign hypertension  I10   6. Hypercholesterolemia  E78.00      RECOMMENDATIONS: Robert Lynn is a 70 y.o. male whose past medical history and cardiac risk factors include: Prostate cancer, dyslipidemia, Hx of melanoma, GERD, Hx of MRSA, HTN, history of CVA, Hx of MVP diagnosed in 1990 and subsequently underwent Mitral Valve Repair (2002), Long-term oral anticoagulation (since 2012 after CVA), advanced age.  Patient was started on oral anticoagulation back in 2012 when he presented to the hospital with expressive aphasia and was diagnosed with acute CVA.   Imaging study also noted that he had remote infarcts in other vascular territories. After reviewing the EMR, it appears that the thought process back in 2012 was that he may have had  cardioembolic phenomenon causing his MRI findings.  Therefore he was started on Coumadin and his INRs are currently being managed by his PCP.  Now he presents to establish care with myself and questioning the need of oral anticoagulation long-term.  Given his history and the MRI findings I suspect that he may have undiagnosed atrial fibrillation given the MRI findings.  He does not have any absolute contraindications to oral anticoagulation nor does he endorse any evidence of bleeding. He also has hx of prostate cancer and melanoma which predisposes him to be hypercoagulable as well.  We discussed various  options during today's encounter. #1 continue oral anticoagulation since there are no contraindication and he is tolerating Coumadin without any significant side effects or intolerances. #2 consider loop implantation and continue oral anticoagulation for 3 months and monitor for atrial fibrillation.  If within 3 months he does not have evidence of atrial fibrillation shared decision can be made to come off of oral anticoagulation with careful monitoring. #3 patient states that he is already invested into [facility] and several times he has noted the device reading " possible atrial fibrillation."  However, he failed to save the rhythm strips.  I did review the 3 rhythm strips that are recorded on his [employer] mobile app during this encounter.  For now patient states that he will continue Coumadin and discuss it further with his PCP and will discuss it further at the next office visit.  I have encouraged him to use his [facility] app and if he does find evidence of possible atrial fibrillation to record the rhythm strip and to bring it in for review.  Given his history of mitral valve repair in 2002 and no recent echocardiograms recommended obtaining a 2D echo to reevaluate LVEF and valvular heart disease.  We will also check a GXT to evaluate for exercise-induced ischemia given his cardiovascular risk factors.  Office blood pressures not  well controlled.  Patient states that home blood pressure readings are better.  Patient is asked to keep a log of his blood pressures and to review it with his PCP.  Continue statin therapy.  Most recent lipid profile independently reviewed during today's encounter.  Patient has documented atherosclerosis in multiple vascular territories and given his hx of CVA would recommend an LDL less than or equal to 70 mg/dL.  FINAL MEDICATION LIST END OF ENCOUNTER: No orders of the defined types were placed in this encounter.   There are no discontinued medications.    Current Outpatient Medications:  .  amLODipine (NORVASC) 5 MG tablet, Take 5 mg by mouth daily., Disp: , Rfl:  .  beclomethasone (QVAR) 80 MCG/ACT inhaler, Inhale 1 puff into the lungs 2 (two) times daily., Disp: , Rfl:  .  fluticasone (FLONASE) 50 MCG/ACT nasal spray, Place 2 sprays into both nostrils daily., Disp: , Rfl:  .  hydrochlorothiazide (HYDRODIURIL) 25 MG tablet, Take 25 mg by mouth daily., Disp: , Rfl:  .  omeprazole (PRILOSEC) 20 MG capsule, Take 20 mg by mouth daily., Disp: , Rfl:  .  ramipril (ALTACE) 10 MG capsule, Take 10 mg by mouth daily., Disp: , Rfl:  .  rosuvastatin (CRESTOR) 5 MG tablet, Take 5 mg by mouth daily., Disp: , Rfl:  .  warfarin (COUMADIN) 5 MG tablet, Take 5 mg by mouth daily., Disp: , Rfl:   Orders Placed This Encounter  Procedures  . SARS-COV-2 RNA,(COVID-19) QUAL NAAT  . PCV CARDIAC STRESS TEST  . EKG 12-Lead  . ECHOCARDIOGRAM COMPLETE    There are no Patient Instructions on file for this visit.   --Continue cardiac medications as reconciled in final medication list. --Return in about 4 weeks (around 04/01/2020) for Follow up s/p MV repair, review results, discuss [place]. . Or sooner if needed. --Continue follow-up with your primary care physician regarding the management of your other chronic comorbid conditions.  Patient's questions and concerns were addressed to his satisfaction. He voices understanding of the instructions provided during this encounter.   This note was created using a voice recognition software as a result there may be grammatical errors inadvertently enclosed that do not reflect the nature of this encounter. Every attempt is made to correct such errors.  Total encounter time 65 minutes.  *Total Encounter Time as defined by the Centers for Medicare and Medicaid Services includes, in addition to the face-to-face time of a patient visit (documented in the note above) non-face-to-face time: obtaining and reviewing outside history  via EMR, ordering and reviewing medications, tests or procedures, care coordination (communications with other health care professionals or caregivers), complex decision making regarding [place], and documentation in the medical record.   Rex Kras, [place], [facility]  Pager: [phone] Office: [phone]

## 2020-03-05 NOTE — Addendum Note (Signed)
Addended by: Oran Rein on: 03/05/2020 12:38 PM   Modules accepted: Level of Service

## 2020-03-10 DIAGNOSIS — I499 Cardiac arrhythmia, unspecified: Secondary | ICD-10-CM | POA: Diagnosis not present

## 2020-03-10 DIAGNOSIS — I779 Disorder of arteries and arterioles, unspecified: Secondary | ICD-10-CM | POA: Diagnosis not present

## 2020-03-10 DIAGNOSIS — K2100 Gastro-esophageal reflux disease with esophagitis, without bleeding: Secondary | ICD-10-CM | POA: Diagnosis not present

## 2020-03-10 DIAGNOSIS — Z7901 Long term (current) use of anticoagulants: Secondary | ICD-10-CM | POA: Diagnosis not present

## 2020-03-10 DIAGNOSIS — I10 Essential (primary) hypertension: Secondary | ICD-10-CM | POA: Diagnosis not present

## 2020-03-15 ENCOUNTER — Other Ambulatory Visit (HOSPITAL_COMMUNITY)
Admission: RE | Admit: 2020-03-15 | Discharge: 2020-03-15 | Disposition: A | Discharge status: Home / Self Care | Payer: BC Managed Care – PPO | Source: Ambulatory Visit | Attending: Internal Medicine | Admitting: Internal Medicine

## 2020-03-15 DIAGNOSIS — Z20822 Contact with and (suspected) exposure to covid-19: Secondary | ICD-10-CM | POA: Insufficient documentation

## 2020-03-15 DIAGNOSIS — Z01812 Encounter for preprocedural laboratory examination: Secondary | ICD-10-CM | POA: Diagnosis not present

## 2020-03-15 LAB — SARS CORONAVIRUS 2 (TAT 6-24 HRS): SARS Coronavirus 2: NEGATIVE

## 2020-03-17 ENCOUNTER — Ambulatory Visit: Payer: BC Managed Care – PPO

## 2020-03-17 ENCOUNTER — Other Ambulatory Visit: Payer: Self-pay

## 2020-03-17 DIAGNOSIS — Z0189 Encounter for other specified special examinations: Secondary | ICD-10-CM | POA: Diagnosis not present

## 2020-03-17 DIAGNOSIS — Z8679 Personal history of other diseases of the circulatory system: Secondary | ICD-10-CM

## 2020-03-17 DIAGNOSIS — Z9889 Other specified postprocedural states: Secondary | ICD-10-CM

## 2020-04-03 ENCOUNTER — Other Ambulatory Visit: Payer: Self-pay

## 2020-04-03 ENCOUNTER — Encounter: Payer: Self-pay | Admitting: Cardiology

## 2020-04-03 ENCOUNTER — Ambulatory Visit: Payer: BC Managed Care – PPO | Admitting: Cardiology

## 2020-04-03 VITALS — BP 142/80 | HR 72 | Temp 97.7°F | Resp 16 | Ht 63.0 in | Wt 151.2 lb

## 2020-04-03 DIAGNOSIS — Z8679 Personal history of other diseases of the circulatory system: Secondary | ICD-10-CM

## 2020-04-03 DIAGNOSIS — Z8673 Personal history of transient ischemic attack (TIA), and cerebral infarction without residual deficits: Secondary | ICD-10-CM | POA: Diagnosis not present

## 2020-04-03 DIAGNOSIS — E7800 Pure hypercholesterolemia, unspecified: Secondary | ICD-10-CM | POA: Diagnosis not present

## 2020-04-03 DIAGNOSIS — Z9889 Other specified postprocedural states: Secondary | ICD-10-CM

## 2020-04-03 DIAGNOSIS — I493 Ventricular premature depolarization: Secondary | ICD-10-CM | POA: Diagnosis not present

## 2020-04-03 DIAGNOSIS — I10 Essential (primary) hypertension: Secondary | ICD-10-CM

## 2020-04-03 DIAGNOSIS — Z7901 Long term (current) use of anticoagulants: Secondary | ICD-10-CM

## 2020-04-03 NOTE — Progress Notes (Signed)
ID:  [name], DOB [birth year], MRN [id number]  PCP:  Robert Cha, MD  Cardiologist:  Rex Kras, DO, [facility] (established care 03/04/2020) Former Cardiology Providers: Dr. Golden Hurter Cardiothoracic Surgeon: Dr. Ricard Dillon  Date: 04/03/2020 Last Office Visit: 03/04/2020  Chief Complaint  Patient presents with  . H/O mitral valve repair  . Follow-up  . Results    HPI  Robert Lynn is a 70 y.o. male who presents to the office with a chief complaint of " history of mitral valve repair, discuss anticoagulation and test results." Patient's past medical history and cardiovascular risk factors include: Prostate cancer, dyslipidemia, Hx of melanoma, GERD, Hx of MRSA, HTN, history of CVA, Hx of MVP diagnosed in 1990 and subsequently underwent Mitral Valve Repair (2002), Long-term oral anticoagulation (since 2012 after CVA), advanced age.  He is referred to the office at the request of Robert Lynn,* for evaluation of history of mitral valve repair and currently on oral anticoagulation.  He recently had an annual physical during that time the question arose was if Coumadin is needed long-term; therefore, he was referred to cardiology for further evaluation and management.  For now his INRs are managed by his primary care provider.  No prior history of intracranial bleeding, GI bleeding, no recent falls, or recent surgeries.  It appears the patient had a mitral valve prolapse diagnosed in 1990 that was being followed and subsequently underwent mitral valve repair with Dr. Ricard Dillon in the year 2002.  Based on the discharge summary from 2012 patient presented to [facility] ER room with symptoms of expressive aphasia and was transferred to [facility] for further evaluation of stroke.  He underwent MRI/MRA which revealed acute infarction involving the left hemisphere affecting the insular cortex, parietal, and posterior temporal lobes and the underlying white  matter.  He was also noted to have remote infarcts of the right parietal and left parietal lobes per report.  He underwent extensive cardiovascular testing and as per the discharge summary he was started on anticoagulated with Coumadin as he had 3 different infarcts involving more than 1 vascular territories.  Please refer to the discharge summary that is available on epic dating 11/20/2010.  At the last office visit we discussed continuing his oral anticoagulation as prescribed given the fact that he has no absolute contraindications for oral anticoagulation given his history as noted above.  Option two proceed consider loop implantation and continue oral anticoagulation for at least 3 months and monitor for atrial fibrillation.  And if within the 3 months he has no evidence of atrial fibrillation that shared decision can be made to safely discontinue oral anticoagulation and continue careful monitoring.  An option 3 was to continue to use his Kardia mobile app and to see if he can capture anything besides NSR.  Since our discussion last time patient states that he would like to continue oral anticoagulation until the work-up is complete.  Patient did undergo a GXT since last office visit which she notes excellent functional capacity for age but interestingly enough he is noted to have PVCs during rest stress and even in recovery.  In addition, patient has not had a chance to get the echocardiogram done.   FUNCTIONAL STATUS: Goes to gym regularly with resistance training and cardio. Used to do 5 K prior to COVID pandemic.    ALLERGIES: No Known Allergies  MEDICATION LIST PRIOR TO VISIT: Current Meds  Medication Sig  . amLODipine (NORVASC) 5 MG tablet Take  5 mg by mouth daily.  . beclomethasone (QVAR) 80 MCG/ACT inhaler Inhale 1 puff into the lungs 2 (two) times daily.  . fluticasone (FLONASE) 50 MCG/ACT nasal spray Place 2 sprays into both nostrils daily.  . hydrochlorothiazide (HYDRODIURIL) 25 MG  tablet Take 25 mg by mouth daily.  Marland Kitchen KLOR-CON M20 20 MEQ tablet TAKE 1 TABLET BY MOUTH EVERY DAY FOR 90 DAYS  . omeprazole (PRILOSEC) 20 MG capsule Take 20 mg by mouth daily.  . ramipril (ALTACE) 10 MG capsule Take 10 mg by mouth daily.  . rosuvastatin (CRESTOR) 5 MG tablet Take 5 mg by mouth daily.  . sildenafil (REVATIO) 20 MG tablet Take 20 mg by mouth as needed.  . warfarin (COUMADIN) 5 MG tablet Take 5 mg by mouth daily.     PAST MEDICAL HISTORY: Past Medical History:  Diagnosis Date  . Allergy   . Heart murmur   . Hyperlipidemia   . Hypertension   . Melanoma ([place])   . MVP (mitral valve prolapse)   . Prostate cancer ([place])   . Stroke [facility])     PAST SURGICAL HISTORY: Past Surgical History:  Procedure Laterality Date  . MITRAL VALVE REPAIR  2002    FAMILY HISTORY: The patient family history includes Dementia in his mother; Emphysema in his father; Snoring in his father. He was adopted.  SOCIAL HISTORY:  The patient  reports that he has never smoked. He has never used smokeless tobacco. He reports that he does not drink alcohol and does not use drugs.  REVIEW OF SYSTEMS: Review of Systems  Constitutional: Negative for chills and fever.  HENT: Negative for hoarse voice and nosebleeds.   Eyes: Negative for discharge, double vision and pain.  Cardiovascular: Negative for chest pain, claudication, dyspnea on exertion, leg swelling, near-syncope, orthopnea, palpitations, paroxysmal nocturnal dyspnea and syncope.  Respiratory: Negative for hemoptysis and shortness of breath.   Musculoskeletal: Negative for muscle cramps and myalgias.  Gastrointestinal: Negative for abdominal pain, constipation, diarrhea, hematemesis, hematochezia, melena, nausea and vomiting.  Neurological: Negative for dizziness and light-headedness.    PHYSICAL EXAM: Vitals with BMI 04/03/2020 03/04/2020  Height '5\' 3"'  '5\' 3"'   Weight 151 lbs 3 oz 152 lbs  BMI 48.18 56.31  Systolic 497 026  Diastolic 80 88   Pulse 72 79    CONSTITUTIONAL: Well-developed and well-nourished. No acute distress.  SKIN: Skin is warm and dry. No rash noted. No cyanosis. No pallor. No jaundice HEAD: Normocephalic and atraumatic.  EYES: No scleral icterus MOUTH/THROAT: Moist oral membranes.  NECK: No JVD present. No thyromegaly noted. No carotid bruits  LYMPHATIC: No visible cervical adenopathy.  CHEST Normal respiratory effort. No intercostal retractions  LUNGS: Clear to auscultation bilaterally. No stridor. No wheezes. No rales.  CARDIOVASCULAR: Regular rate and rhythm, positive S1-S2, no murmurs rubs or gallops appreciated. ABDOMINAL: soft, nontender, nondistended, positive bowel sounds all 4 quadrants. No apparent ascites.  EXTREMITIES: No peripheral edema  HEMATOLOGIC: No significant bruising NEUROLOGIC: Oriented to person, place, and time. Nonfocal. Normal muscle tone.  PSYCHIATRIC: Normal mood and affect. Normal behavior. Cooperative  RADIOLOGY: MRI /MRA Head and Neck w/ w/o contrast: 11/19/2010: Moderate sized acute left hemisphere infarct affects the insular cortex, parietal and posterior temporal lobes, and underlying white matter.  Remote infarcts of the right parietal and left parietal lobes are observed.  There is also a small remote right posterior frontal infarct. Remote cerebellar lacunar infarcts are visualized. Brainstem is spared. Mild irregularity distal MCA and PCA branches consistent  with intracranial atherosclerotic disease. No proximal stenosis.   CARDIAC DATABASE: Mitral valve repair:  S/P Median sternotomy for mitral valve repair (quadrangular resection of posterior leaflet, chordal transfer from posterior to anterior leaflet x 2, artificial chordal replacement x 3, #25 Carpentier-Edwards ring annuloplasty) by Dr. Darylene Price.   EKG: 03/04/2020: Normal sinus rhythm, 74 bpm, without underlying injury pattern.  Echocardiogram: 11/20/2010 transesophageal echocardiogram at [facility]:  LVEF 60-65%, no regional wall motion abnormality. Trivial AR. Annular ring prosthesis present and functioning normally with trivial regurgitation. Atrial septum: No defect or PFO was identified.  Stress Testing: Exercise treadmill stress test 03/17/2020: Exercise treadmill stress test performed using Bruce protocol.  Patient reached 11 METS, and 111% of age predicted maximum heart rate.  Exercise capacity was excellent.  No chest pain reported.  Normal heart rate and hemodynamic response. Stress EKG revealed no ischemic changes, but showed frequent PVC's. PVC's were more frequent at rest, stress, and recovery. Consider clinical correlation and workup due to frequent PVC's.  Heart Catheterization: May 2012: IMPRESSION: 1. Essentially normal coronary arteries. 2. Normal left ventricular function. 3. Dilated left atrium. 4. Severe mitral regurgitation with anterior posterior mitral valve prolapse. 5. Normal pulmonary pressure. 6. Normal cardiac indices. 7. Normal oxygen saturation.  LABORATORY DATA: External Labs:  Collected: 02/20/2020 provided by PCP  Creatinine 0.92 mg/dL. eGFR: 82 mL/min per 1.73 m Lipid profile: Total cholesterol 171, triglycerides 166, HDL 52, LDL 90, non-HDL 119 AST 27, ALT 21, alkaline phosphatase 65 Hemoglobin 14.3 g/dL, hematocrit 44.4%  IMPRESSION:    ICD-10-CM   1. H/O mitral valve repair  Z98.890 LONG TERM MONITOR-LIVE TELEMETRY (3-14 DAYS)  2. History of mitral valve prolapse  Z86.79 LONG TERM MONITOR-LIVE TELEMETRY (3-14 DAYS)  3. Long term (current) use of anticoagulants  Z79.01   4. Premature ventricular complex  I49.3 LONG TERM MONITOR-LIVE TELEMETRY (3-14 DAYS)  5. Benign hypertension  I10   6. History of stroke  Z86.73   7. Hypercholesterolemia  E78.00      RECOMMENDATIONS: KAGE WILLMANN is a 70 y.o. male whose past medical history and cardiac risk factors include: Prostate cancer, dyslipidemia, Hx of melanoma, GERD, Hx of MRSA,  HTN, history of CVA, Hx of MVP diagnosed in 1990 and subsequently underwent Mitral Valve Repair (2002), Long-term oral anticoagulation (since 2012 after CVA), advanced age.  Patient has been on Coumadin since 2012 when he presented to the hospital with expressive aphasia was diagnosed with an acute CVA.  At that time imaging studies also noted remote infarcts and other vascular territories.  Based on the discharge summary thought process was that he may have underlying cardioembolic phenomena and given his underlying mitral valve repair this shared decision at that time was to initiate oral anticoagulation to prevent further recurrences.  Of note, patient has underlying history of prostate cancer melanoma which predisposes him to hypercoagulable state.  At the present time patient would like to continue his oral anticoagulation as originally prescribed as he has not had any reoccurrences of strokelike symptoms, overall considered to be low bleeding risk.  We discussed either undergoing an extended monitor versus loop implantation to further detect the presence or absence of atrial fibrillation.  Patient would like to proceed with extended Holter monitor to evaluate for his underlying PVC burden and possible atrial fibrillation.  Patient will be scheduled for 14-day extended Holter monitor.  He is also encouraged to continue using his Kardia mobile app and record all the tracings except normal sinus rhythm.  Given his history of mitral valve repair in 2002 and no recent echocardiograms recommended obtaining a 2D echo to reevaluate LVEF and valvular heart disease.  Patient is scheduled for an echo on 04/28/2020.  GXT results reviewed with him in great detail and findings noted above.  Office blood pressures not well controlled.  Patient states that home blood pressure readings are better.  Patient is asked to keep a log of his blood pressures and to review it with his PCP.  Continue statin therapy.  Most  recent lipid profile.  Given the fact that he has atherosclerosis in multiple vascular territories and given his hx of CVA would recommend an LDL <70 mg/dL.  FINAL MEDICATION LIST END OF ENCOUNTER: No orders of the defined types were placed in this encounter.   There are no discontinued medications.   Current Outpatient Medications:  .  amLODipine (NORVASC) 5 MG tablet, Take 5 mg by mouth daily., Disp: , Rfl:  .  beclomethasone (QVAR) 80 MCG/ACT inhaler, Inhale 1 puff into the lungs 2 (two) times daily., Disp: , Rfl:  .  fluticasone (FLONASE) 50 MCG/ACT nasal spray, Place 2 sprays into both nostrils daily., Disp: , Rfl:  .  hydrochlorothiazide (HYDRODIURIL) 25 MG tablet, Take 25 mg by mouth daily., Disp: , Rfl:  .  KLOR-CON M20 20 MEQ tablet, TAKE 1 TABLET BY MOUTH EVERY DAY FOR 90 DAYS, Disp: , Rfl:  .  omeprazole (PRILOSEC) 20 MG capsule, Take 20 mg by mouth daily., Disp: , Rfl:  .  ramipril (ALTACE) 10 MG capsule, Take 10 mg by mouth daily., Disp: , Rfl:  .  rosuvastatin (CRESTOR) 5 MG tablet, Take 5 mg by mouth daily., Disp: , Rfl:  .  sildenafil (REVATIO) 20 MG tablet, Take 20 mg by mouth as needed., Disp: , Rfl:  .  warfarin (COUMADIN) 5 MG tablet, Take 5 mg by mouth daily., Disp: , Rfl:   Orders Placed This Encounter  Procedures  . LONG TERM MONITOR-LIVE TELEMETRY (3-14 DAYS)    There are no Patient Instructions on file for this visit.   --Continue cardiac medications as reconciled in final medication list. --Return in about 5 weeks (around 05/09/2020) for Follow up PVC follow, montior results, and review echo and discuss ILR vs. [place]. . Or sooner if needed. --Continue follow-up with your primary care physician regarding the management of your other chronic comorbid conditions.  Patient's questions and concerns were addressed to his satisfaction. He voices understanding of the instructions provided during this encounter.   This note was created using a voice recognition software  as a result there may be grammatical errors inadvertently enclosed that do not reflect the nature of this encounter. Every attempt is made to correct such errors.   Rex Kras, [place], [facility]  Pager: [phone] Office: [phone]

## 2020-04-10 ENCOUNTER — Inpatient Hospital Stay: Payer: BC Managed Care – PPO

## 2020-04-10 DIAGNOSIS — I493 Ventricular premature depolarization: Secondary | ICD-10-CM

## 2020-04-10 DIAGNOSIS — Z8679 Personal history of other diseases of the circulatory system: Secondary | ICD-10-CM

## 2020-04-10 DIAGNOSIS — Z9889 Other specified postprocedural states: Secondary | ICD-10-CM

## 2020-04-11 DIAGNOSIS — J0100 Acute maxillary sinusitis, unspecified: Secondary | ICD-10-CM | POA: Diagnosis not present

## 2020-04-11 DIAGNOSIS — Z7901 Long term (current) use of anticoagulants: Secondary | ICD-10-CM | POA: Diagnosis not present

## 2020-04-11 DIAGNOSIS — J309 Allergic rhinitis, unspecified: Secondary | ICD-10-CM | POA: Diagnosis not present

## 2020-04-22 DIAGNOSIS — Z7901 Long term (current) use of anticoagulants: Secondary | ICD-10-CM | POA: Diagnosis not present

## 2020-04-25 ENCOUNTER — Other Ambulatory Visit: Payer: Self-pay

## 2020-04-25 DIAGNOSIS — Z8679 Personal history of other diseases of the circulatory system: Secondary | ICD-10-CM

## 2020-04-25 DIAGNOSIS — Z9889 Other specified postprocedural states: Secondary | ICD-10-CM

## 2020-04-25 DIAGNOSIS — I493 Ventricular premature depolarization: Secondary | ICD-10-CM

## 2020-04-28 ENCOUNTER — Inpatient Hospital Stay: Payer: BC Managed Care – PPO

## 2020-04-28 ENCOUNTER — Ambulatory Visit (HOSPITAL_COMMUNITY)
Admission: RE | Admit: 2020-04-28 | Discharge: 2020-04-28 | Disposition: A | Discharge status: Home / Self Care | Payer: BC Managed Care – PPO | Source: Ambulatory Visit | Attending: Cardiology | Admitting: Cardiology

## 2020-04-28 ENCOUNTER — Other Ambulatory Visit: Payer: Self-pay

## 2020-04-28 DIAGNOSIS — Z9889 Other specified postprocedural states: Secondary | ICD-10-CM | POA: Diagnosis not present

## 2020-04-28 DIAGNOSIS — I493 Ventricular premature depolarization: Secondary | ICD-10-CM | POA: Diagnosis not present

## 2020-04-28 DIAGNOSIS — I10 Essential (primary) hypertension: Secondary | ICD-10-CM | POA: Diagnosis not present

## 2020-04-28 DIAGNOSIS — Z0189 Encounter for other specified special examinations: Secondary | ICD-10-CM | POA: Diagnosis not present

## 2020-04-28 DIAGNOSIS — I059 Rheumatic mitral valve disease, unspecified: Secondary | ICD-10-CM | POA: Diagnosis not present

## 2020-04-28 DIAGNOSIS — Z8679 Personal history of other diseases of the circulatory system: Secondary | ICD-10-CM | POA: Diagnosis not present

## 2020-04-28 NOTE — Progress Notes (Signed)
  Echocardiogram 2D Echocardiogram has been performed.  Robert Lynn 04/28/2020, 3:57 PM

## 2020-04-29 DIAGNOSIS — Z7901 Long term (current) use of anticoagulants: Secondary | ICD-10-CM | POA: Diagnosis not present

## 2020-05-01 DIAGNOSIS — Z8679 Personal history of other diseases of the circulatory system: Secondary | ICD-10-CM | POA: Insufficient documentation

## 2020-05-01 DIAGNOSIS — Z9889 Other specified postprocedural states: Secondary | ICD-10-CM | POA: Insufficient documentation

## 2020-05-01 LAB — ECHOCARDIOGRAM COMPLETE
Area-P 1/2: 1.55 cm2
Calc EF: 59.4 %
MV VTI: 2.01 cm2
S' Lateral: 2.9 cm
Single Plane A2C EF: 64.6 %
Single Plane A4C EF: 56.4 %

## 2020-05-15 ENCOUNTER — Ambulatory Visit: Payer: BC Managed Care – PPO | Admitting: Cardiology

## 2020-06-02 ENCOUNTER — Ambulatory Visit: Payer: BC Managed Care – PPO | Admitting: Cardiology

## 2020-06-02 ENCOUNTER — Encounter: Payer: Self-pay | Admitting: Cardiology

## 2020-06-02 ENCOUNTER — Other Ambulatory Visit: Payer: Self-pay

## 2020-06-02 VITALS — BP 143/76 | HR 76 | Temp 98.0°F | Resp 16 | Ht 63.0 in | Wt 150.0 lb

## 2020-06-02 DIAGNOSIS — Z9889 Other specified postprocedural states: Secondary | ICD-10-CM

## 2020-06-02 DIAGNOSIS — I480 Paroxysmal atrial fibrillation: Secondary | ICD-10-CM | POA: Diagnosis not present

## 2020-06-02 DIAGNOSIS — Z8679 Personal history of other diseases of the circulatory system: Secondary | ICD-10-CM | POA: Diagnosis not present

## 2020-06-02 DIAGNOSIS — I10 Essential (primary) hypertension: Secondary | ICD-10-CM

## 2020-06-02 DIAGNOSIS — I493 Ventricular premature depolarization: Secondary | ICD-10-CM | POA: Diagnosis not present

## 2020-06-02 DIAGNOSIS — Z7901 Long term (current) use of anticoagulants: Secondary | ICD-10-CM | POA: Diagnosis not present

## 2020-06-02 DIAGNOSIS — Z8673 Personal history of transient ischemic attack (TIA), and cerebral infarction without residual deficits: Secondary | ICD-10-CM

## 2020-06-02 DIAGNOSIS — E7800 Pure hypercholesterolemia, unspecified: Secondary | ICD-10-CM

## 2020-06-02 MED ORDER — METOPROLOL SUCCINATE ER 50 MG PO TB24: 50.0000 mg | ORAL_TABLET | Freq: Every day | ORAL | 0 refills | Status: DC

## 2020-06-02 NOTE — Progress Notes (Signed)
ID:  Robert Lynn, DOB [birth year], MRN [id number]  PCP:  Leeroy Cha, MD  Cardiologist:  Rex Kras, DO, [facility] (established care 03/04/2020) Former Cardiology Providers: Dr. Golden Hurter Cardiothoracic Surgeon: Dr. Ricard Dillon  Date: 06/02/20 Last Office Visit: 04/03/2020  Chief Complaint  Patient presents with  . H/O mitral valve repair  . PVC  . Follow-up    5 weeks  . Results    Monitor, echo     HPI  Robert Lynn is a 70 y.o. male who presents to the office with a chief complaint of " history of mitral valve repair and review test results." Patient's past medical history and cardiovascular risk factors include: Prostate cancer, dyslipidemia, Hx of melanoma, GERD, Hx of MRSA, HTN, history of CVA, Hx of MVP diagnosed in 1990 and subsequently underwent Mitral Valve Repair (2002), Long-term oral anticoagulation (since 2012 after CVA), advanced age.  He is referred to the office at the request of Leeroy Cha,* for evaluation of history of mitral valve repair and currently on oral anticoagulation.  He recently had an annual physical during that time the question arose was if Coumadin is needed long-term; therefore, he was referred to cardiology for further evaluation and management.  For now his INRs are managed by his primary care provider.  No prior history of intracranial bleeding, GI bleeding, no recent falls, or recent surgeries.  It appears the patient had a mitral valve prolapse diagnosed in 1990 and underwent mitral valve repair with Dr. Ricard Dillon in 2002.  Based on the discharge summary from 2012 patient presented to [facility] ER room with symptoms of expressive aphasia and was transferred to [facility] for further evaluation of stroke.  He underwent MRI/MRA which revealed acute infarction involving the left hemisphere affecting the insular cortex, parietal, and posterior temporal lobes and the underlying white matter.  He was also noted  to have remote infarcts of the right parietal and left parietal lobes per report.  He underwent extensive cardiovascular testing and as per the discharge summary he was started on anticoagulated with Coumadin as he had 3 different infarcts involving more than 1 vascular territories.  Please refer to the discharge summary that is available on epic dating 11/20/2010.  Since last office visit patient underwent an extended Holter monitor which notes presence of atrial fibrillation.  I recommended that he continue Coumadin given his mitral valve repair and patient would like to continue his follow-up INR checks at his PCPs office.  Patient underwent exercise treadmill stress test which noted excellent functional capacity for age; however, he had premature ventricular contractions at rest, during exercise and recovery.  The extended Holter monitor noted a PVC burden of approximately 4% and also had a couple episodes of NSVT.  Since last office visit patient has ran a 4K and also has been hiking regularly without any effort related symptoms.   FUNCTIONAL STATUS: Goes to gym regularly with resistance training and cardio. Used to do 5 K prior to COVID pandemic.    ALLERGIES: No Known Allergies  MEDICATION LIST PRIOR TO VISIT: Current Meds  Medication Sig  . amLODipine (NORVASC) 5 MG tablet Take 5 mg by mouth daily.  . beclomethasone (QVAR) 80 MCG/ACT inhaler Inhale 1 puff into the lungs 2 (two) times daily.  . fluticasone (FLONASE) 50 MCG/ACT nasal spray Place 2 sprays into both nostrils daily.  . hydrochlorothiazide (HYDRODIURIL) 25 MG tablet Take 25 mg by mouth daily.  Marland Kitchen KLOR-CON M20 20 MEQ tablet TAKE  1 TABLET BY MOUTH EVERY DAY FOR 90 DAYS  . metoprolol succinate (TOPROL-XL) 50 MG 24 hr tablet Take 1 tablet (50 mg total) by mouth daily. Take with or immediately following a meal. Hold if top blood pressure number less than 100 mmHg or pulse less than 60 bpm.  . omeprazole (PRILOSEC) 20 MG capsule  Take 20 mg by mouth daily.  . ramipril (ALTACE) 10 MG capsule Take 10 mg by mouth daily.  . rosuvastatin (CRESTOR) 5 MG tablet Take 5 mg by mouth daily.  . sildenafil (REVATIO) 20 MG tablet Take 20 mg by mouth as needed.  . warfarin (COUMADIN) 5 MG tablet Take 5 mg by mouth daily.     PAST MEDICAL HISTORY: Past Medical History:  Diagnosis Date  . Allergy   . Atrial fibrillation ([place])   . Heart murmur   . Hyperlipidemia   . Hypertension   . Melanoma ([place])   . MVP (mitral valve prolapse)   . Prostate cancer ([place])   . PVC (premature ventricular contraction)   . Stroke [facility])     PAST SURGICAL HISTORY: Past Surgical History:  Procedure Laterality Date  . MITRAL VALVE REPAIR  2002    FAMILY HISTORY: The patient family history includes Dementia in his mother; Emphysema in his father; Snoring in his father. He was adopted.  SOCIAL HISTORY:  The patient  reports that he has never smoked. He has never used smokeless tobacco. He reports that he does not drink alcohol and does not use drugs.  REVIEW OF SYSTEMS: Review of Systems  Constitutional: Negative for chills and fever.  HENT: Negative for hoarse voice and nosebleeds.   Eyes: Negative for discharge, double vision and pain.  Cardiovascular: Negative for chest pain, claudication, dyspnea on exertion, leg swelling, near-syncope, orthopnea, palpitations, paroxysmal nocturnal dyspnea and syncope.  Respiratory: Negative for hemoptysis and shortness of breath.   Musculoskeletal: Negative for muscle cramps and myalgias.  Gastrointestinal: Negative for abdominal pain, constipation, diarrhea, hematemesis, hematochezia, melena, nausea and vomiting.  Neurological: Negative for dizziness and light-headedness.    PHYSICAL EXAM: Vitals with BMI 06/02/2020 04/03/2020 03/04/2020  Height 5' 3" 5' 3" 5' 3"  Weight 150 lbs 151 lbs 3 oz 152 lbs  BMI 26.58 36.62 94.76  Systolic 546 503 546  Diastolic 76 80 88  Pulse 76 72 79     CONSTITUTIONAL: Well-developed and well-nourished. No acute distress.  SKIN: Skin is warm and dry. No rash noted. No cyanosis. No pallor. No jaundice HEAD: Normocephalic and atraumatic.  EYES: No scleral icterus MOUTH/THROAT: Moist oral membranes.  NECK: No JVD present. No thyromegaly noted. No carotid bruits  LYMPHATIC: No visible cervical adenopathy.  CHEST Normal respiratory effort. No intercostal retractions  LUNGS: Clear to auscultation bilaterally. No stridor. No wheezes. No rales.  CARDIOVASCULAR: Regular rate and rhythm, positive S1-S2, no murmurs rubs or gallops appreciated. ABDOMINAL: soft, nontender, nondistended, positive bowel sounds all 4 quadrants. No apparent ascites.  EXTREMITIES: No peripheral edema  HEMATOLOGIC: No significant bruising NEUROLOGIC: Oriented to person, place, and time. Nonfocal. Normal muscle tone.  PSYCHIATRIC: Normal mood and affect. Normal behavior. Cooperative  RADIOLOGY: MRI /MRA Head and Neck w/ w/o contrast: 11/19/2010: Moderate sized acute left hemisphere infarct affects the insular cortex, parietal and posterior temporal lobes, and underlying white matter.  Remote infarcts of the right parietal and left parietal lobes are observed.  There is also a small remote right posterior frontal infarct. Remote cerebellar lacunar infarcts are visualized. Brainstem is spared. Mild irregularity  distal MCA and PCA branches consistent with intracranial atherosclerotic disease. No proximal stenosis.   CARDIAC DATABASE: Mitral valve repair:  S/P Median sternotomy for mitral valve repair (quadrangular resection of posterior leaflet, chordal transfer from posterior to anterior leaflet x 2, artificial chordal replacement x 3, #25 Carpentier-Edwards ring annuloplasty) by Dr. Darylene Price.   EKG: 03/04/2020: Normal sinus rhythm, 74 bpm, without underlying injury pattern.  Echocardiogram: 04/28/2020: 1. Left ventricular ejection fraction, by estimation,  is 55 to 60%. The left ventricle has normal function. The left ventricle has no regional wall motion abnormalities. Left ventricular diastolic function could not be evaluated. Elevated left atrial  pressure. The average left ventricular global longitudinal strain is -17.5 %. 2. Right ventricular systolic function is low normal. The right ventricular size is normal. There is normal pulmonary artery systolic pressure. The estimated right ventricular systolic pressure is 62.6 mmHg. 3. Annuloplasty ring present (Carpentier-Edwards) well seated, no evidence of dehiscence. No evidence of mitral valve regurgitation. Mild mitral stenosis (peak velocity 2.74ms, MG 542mg, MVA 1.95cm2, PHT 13668m, HR 73bpm). 4. The aortic valve is tricuspid. Aortic valve regurgitation is not visualized. No aortic stenosis is present. 5. The inferior vena cava is normal in size with greater than 50% respiratory variability, suggesting right atrial pressure of 3 mmHg. Comparison(s): Prior echo 11/20/2010: LVEF 60-65%, Trivial AR, normal functioning annular ring prosthesis.  Stress Testing: Exercise treadmill stress test 03/17/2020: Exercise treadmill stress test performed using Bruce protocol.  Patient reached 11 METS, and 111% of age predicted maximum heart rate.  Exercise capacity was excellent.  No chest pain reported.  Normal heart rate and hemodynamic response. Stress EKG revealed no ischemic changes, but showed frequent PVC's. PVC's were more frequent at rest, stress, and recovery. Consider clinical correlation and workup due to frequent PVC's.  Heart Catheterization: May 2012: IMPRESSION: 1. Essentially normal coronary arteries. 2. Normal left ventricular function. 3. Dilated left atrium. 4. Severe mitral regurgitation with anterior posterior mitral valve prolapse. 5. Normal pulmonary pressure. 6. Normal cardiac indices. 7. Normal oxygen saturation.  14 day extended Holter monitor: Dominant rhythm normal sinus  rhythm, followed by atrial fibrillation. Heart rate 45-142 bpm. Avg HR 72 bpm. No high grade AV block or pauses (3 seconds or longer). 2 episodes of nonsustained ventricular tachycardia, 4 beats in duration, fastest 158 bpm.  Atrial fibrillation burden approximately 1% (average heart rate 99 bpm, heart rate ranging between 60- 146 bpm). Longest duration of atrial fibrillation 45 minutes and 41 seconds. Total supraventricular ectopic burden <1%. Total ventricular ectopic burden of approximately 4.2% (episodes of isolated PVCs, and PVCs in bigeminy/trigeminy pattern) Patient triggered events: 1. Underlying rhythm normal sinus with rare ventricular ectopy.  LABORATORY DATA: External Labs:  Collected: 02/20/2020 provided by PCP  Creatinine 0.92 mg/dL. eGFR: 82 mL/min per 1.73 m Lipid profile: Total cholesterol 171, triglycerides 166, HDL 52, LDL 90, non-HDL 119 AST 27, ALT 21, alkaline phosphatase 65 Hemoglobin 14.3 g/dL, hematocrit 44.4%  IMPRESSION:    ICD-10-CM   1. Paroxysmal atrial fibrillation (HCC)  I48.0 metoprolol succinate (TOPROL-XL) 50 MG 24 hr tablet    PCV MYOCARDIAL PERFUSION WO LEXISCAN    SARS-COV-2 RNA,(COVID-19) QUAL NAAT  2. Long term (current) use of anticoagulants  Z79.01   3. History of mitral valve prolapse  Z86.79   4. H/O mitral valve repair  Z98.890   5. Premature ventricular complex  I49.3 metoprolol succinate (TOPROL-XL) 50 MG 24 hr tablet  6. Benign hypertension  I10   7. History of stroke  Z86.73   8. Hypercholesterolemia  E78.00      RECOMMENDATIONS: Robert Lynn is a 70 y.o. male whose past medical history and cardiac risk factors include: Prostate cancer, dyslipidemia, Hx of melanoma, GERD, Hx of MRSA, HTN, history of CVA, Hx of MVP diagnosed in 1990 and subsequently underwent Mitral Valve Repair (2002), Long-term oral anticoagulation (since 2012 after CVA), advanced age.  Paroxysmal atrial fibrillation: Most recent extended Holter monitor noted  atrial fibrillation burden approximately 1% given his history noted above he is already on oral anticoagulation with Coumadin with a goal INR of 2-3.  His INRs are being followed by his PCPs office.  Given the finding of paroxysmal atrial fibrillation and PVC burden recommended initiation of Toprol-XL 50 mg p.o. daily.  Patient is aware of the risks, benefits, and alternatives to oral anticoagulation.  Patient recently had a GXT which noted excellent functional capacity for age but he had premature ventricular contractions throughout the study and the most recent extended Holter monitor notes a PVC burden of approximately 4.2%.  Recommended initiation of beta-blocker therapy and to reevaluate thereafter.  However, given this finding and also NSVT on the recent monitor recommended exercise nuclear stress test to rule out reversible ischemia.  Reviewed the most recent echocardiogram results with the patient at today's office visit and noted above for further reference.    Office blood pressures within acceptable range but not at goal.  Patient states that home blood pressure readings are better.  Patient is asked to keep a log of his blood pressures and to review it with his PCP.  Continue statin therapy.  Most recent lipid profile.  Given the fact that he has atherosclerosis in multiple vascular territories and given his hx of CVA would recommend an LDL <70 mg/dL.  FINAL MEDICATION LIST END OF ENCOUNTER: Meds ordered this encounter  Medications  . metoprolol succinate (TOPROL-XL) 50 MG 24 hr tablet    Sig: Take 1 tablet (50 mg total) by mouth daily. Take with or immediately following a meal. Hold if top blood pressure number less than 100 mmHg or pulse less than 60 bpm.    Dispense:  90 tablet    Refill:  0    There are no discontinued medications.   Current Outpatient Medications:  .  amLODipine (NORVASC) 5 MG tablet, Take 5 mg by mouth daily., Disp: , Rfl:  .  beclomethasone (QVAR) 80 MCG/ACT  inhaler, Inhale 1 puff into the lungs 2 (two) times daily., Disp: , Rfl:  .  fluticasone (FLONASE) 50 MCG/ACT nasal spray, Place 2 sprays into both nostrils daily., Disp: , Rfl:  .  hydrochlorothiazide (HYDRODIURIL) 25 MG tablet, Take 25 mg by mouth daily., Disp: , Rfl:  .  KLOR-CON M20 20 MEQ tablet, TAKE 1 TABLET BY MOUTH EVERY DAY FOR 90 DAYS, Disp: , Rfl:  .  metoprolol succinate (TOPROL-XL) 50 MG 24 hr tablet, Take 1 tablet (50 mg total) by mouth daily. Take with or immediately following a meal. Hold if top blood pressure number less than 100 mmHg or pulse less than 60 bpm., Disp: 90 tablet, Rfl: 0 .  omeprazole (PRILOSEC) 20 MG capsule, Take 20 mg by mouth daily., Disp: , Rfl:  .  ramipril (ALTACE) 10 MG capsule, Take 10 mg by mouth daily., Disp: , Rfl:  .  rosuvastatin (CRESTOR) 5 MG tablet, Take 5 mg by mouth daily., Disp: , Rfl:  .  sildenafil (REVATIO) 20 MG tablet, Take 20 mg by mouth as needed.,  Disp: , Rfl:  .  warfarin (COUMADIN) 5 MG tablet, Take 5 mg by mouth daily., Disp: , Rfl:   Orders Placed This Encounter  Procedures  . SARS-COV-2 RNA,(COVID-19) QUAL NAAT  . PCV MYOCARDIAL PERFUSION WO LEXISCAN    There are no Patient Instructions on file for this visit.   --Continue cardiac medications as reconciled in final medication list. --Return in about 4 weeks (around 06/30/2020) for Follow up, A. fib, Review test results. Or sooner if needed. --Continue follow-up with your primary care physician regarding the management of your other chronic comorbid conditions.  Patient's questions and concerns were addressed to his satisfaction. He voices understanding of the instructions provided during this encounter.   This note was created using a voice recognition software as a result there may be grammatical errors inadvertently enclosed that do not reflect the nature of this encounter. Every attempt is made to correct such errors.   Rex Kras, [place], [facility]  Pager:  [phone] Office: [phone]

## 2020-06-09 ENCOUNTER — Telehealth: Payer: Self-pay

## 2020-06-09 ENCOUNTER — Ambulatory Visit: Payer: BC Managed Care – PPO

## 2020-06-09 ENCOUNTER — Other Ambulatory Visit: Payer: Self-pay

## 2020-06-09 DIAGNOSIS — I480 Paroxysmal atrial fibrillation: Secondary | ICD-10-CM

## 2020-06-09 NOTE — Telephone Encounter (Signed)
done

## 2020-06-16 DIAGNOSIS — C61 Malignant neoplasm of prostate: Secondary | ICD-10-CM | POA: Diagnosis not present

## 2020-06-23 ENCOUNTER — Other Ambulatory Visit: Payer: Self-pay

## 2020-06-23 ENCOUNTER — Ambulatory Visit: Payer: BC Managed Care – PPO | Admitting: Pharmacist

## 2020-06-23 DIAGNOSIS — I480 Paroxysmal atrial fibrillation: Secondary | ICD-10-CM

## 2020-06-23 DIAGNOSIS — Z5181 Encounter for therapeutic drug level monitoring: Secondary | ICD-10-CM

## 2020-06-23 DIAGNOSIS — Z9889 Other specified postprocedural states: Secondary | ICD-10-CM | POA: Diagnosis not present

## 2020-06-23 DIAGNOSIS — Z7901 Long term (current) use of anticoagulants: Secondary | ICD-10-CM | POA: Diagnosis not present

## 2020-06-23 LAB — POCT INR: INR: 2.4 (ref 2.0–3.0)

## 2020-06-23 NOTE — Progress Notes (Signed)
Anticoagulation Management Robert Lynn is a 70 y.o. male who reports to the clinic for monitoring of warfarin treatment.    Indication: Paroxysmal Atrial Fibrillation, H/o mitral valve repair CHA2DS2 Vasc Score 4 (Age 18-74, HTN hx, Hx of stroke), HAS-BLED 2 (Age>65, Stroke hx)  Duration: indefinite Supervising physician: Rex Kras  Anticoagulation Clinic Visit History:  Patient does not report signs/symptoms of bleeding or thromboembolism   Other recent changes: No change in diet, medications, lifestyle  Anticoagulation Episode Summary    Current INR goal:  2.0-3.0  TTR:  --  Next INR check:  07/22/2020  INR from last check:  2.4 (06/23/2020)  Weekly max warfarin dose:    Target end date:  Indefinite  INR check location:    Preferred lab:    Send INR reminders to:     Indications   H/O mitral valve repair [Z98.890] Monitoring for long-term anticoagulant use [Z51.81 Z79.01] Paroxysmal atrial fibrillation (HCC) [I48.0]       Comments:          No Known Allergies  Current Outpatient Medications:  .  amLODipine (NORVASC) 5 MG tablet, Take 5 mg by mouth daily., Disp: , Rfl:  .  beclomethasone (QVAR) 80 MCG/ACT inhaler, Inhale 1 puff into the lungs 2 (two) times daily., Disp: , Rfl:  .  fluticasone (FLONASE) 50 MCG/ACT nasal spray, Place 2 sprays into both nostrils daily., Disp: , Rfl:  .  hydrochlorothiazide (HYDRODIURIL) 25 MG tablet, Take 25 mg by mouth daily., Disp: , Rfl:  .  KLOR-CON M20 20 MEQ tablet, TAKE 1 TABLET BY MOUTH EVERY DAY FOR 90 DAYS, Disp: , Rfl:  .  metoprolol succinate (TOPROL-XL) 50 MG 24 hr tablet, Take 1 tablet (50 mg total) by mouth daily. Take with or immediately following a meal. Hold if top blood pressure number less than 100 mmHg or pulse less than 60 bpm., Disp: 90 tablet, Rfl: 0 .  omeprazole (PRILOSEC) 20 MG capsule, Take 20 mg by mouth daily., Disp: , Rfl:  .  ramipril (ALTACE) 10 MG capsule, Take 10 mg by mouth daily., Disp: , Rfl:  .   rosuvastatin (CRESTOR) 5 MG tablet, Take 5 mg by mouth daily., Disp: , Rfl:  .  sildenafil (REVATIO) 20 MG tablet, Take 20 mg by mouth as needed., Disp: , Rfl:  .  warfarin (COUMADIN) 5 MG tablet, Take 5 mg by mouth daily., Disp: , Rfl:  Past Medical History:  Diagnosis Date  . Allergy   . Atrial fibrillation ([place])   . Heart murmur   . Hyperlipidemia   . Hypertension   . Melanoma ([place])   . MVP (mitral valve prolapse)   . Prostate cancer ([place])   . PVC (premature ventricular contraction)   . Stroke [facility])     ASSESSMENT  Recent Results: The most recent result is correlated with 27.5 mg per week:  Lab Results  Component Value Date   INR 2.4 06/23/2020   INR 1.15 11/19/2010    Anticoagulation Dosing:    INR today: Therapeutic. Pt continues to remain remain therapeutic on current weekly dose. Denies any complains of bleeding or bruising symptoms. Denies any other relevant changes in her diet, medications, or lifestyle. Will continue current weekly dose and continue monitoring.   PLAN Weekly dose was unchanged by 0% to 27.5 mg per week. Continue taking 2.5 mg every Mon, Wed, Fri and 5 mg all other days. Recheck INR in 4 weeks.   Patient Instructions  INR at goal. Continue  taking warfarin 2.5 mg every Mon, Wed, Fri and 5 mg all other days. Recheck INR in 4 weeks.   Patient advised to contact clinic or seek medical attention if signs/symptoms of bleeding or thromboembolism occur.  Patient verbalized understanding by repeating back information and was advised to contact me if further medication-related questions arise.   Follow-up Return in about 29 days (around 07/22/2020).  [name], PharmD  15 minutes spent face-to-face with the patient during the encounter. 50% of time spent on education, including signs/sx bleeding and clotting, as well as food and drug interactions with warfarin. 50% of time was spent on fingerprick POC INR sample collection,processing, results  determination, and documentation

## 2020-06-24 NOTE — Patient Instructions (Signed)
INR at goal. Continue taking warfarin 2.5 mg every Mon, Wed, Fri and 5 mg all other days. Recheck INR in 4 weeks.

## 2020-07-22 ENCOUNTER — Ambulatory Visit: Payer: BC Managed Care – PPO | Admitting: Cardiology

## 2020-07-22 ENCOUNTER — Other Ambulatory Visit: Payer: Self-pay

## 2020-07-22 ENCOUNTER — Encounter: Payer: Self-pay | Admitting: Cardiology

## 2020-07-22 VITALS — BP 147/65 | HR 63 | Temp 98.7°F | Resp 17 | Ht 63.0 in | Wt 151.0 lb

## 2020-07-22 DIAGNOSIS — I480 Paroxysmal atrial fibrillation: Secondary | ICD-10-CM

## 2020-07-22 DIAGNOSIS — Z5181 Encounter for therapeutic drug level monitoring: Secondary | ICD-10-CM | POA: Diagnosis not present

## 2020-07-22 DIAGNOSIS — R9439 Abnormal result of other cardiovascular function study: Secondary | ICD-10-CM | POA: Diagnosis not present

## 2020-07-22 DIAGNOSIS — E7800 Pure hypercholesterolemia, unspecified: Secondary | ICD-10-CM

## 2020-07-22 DIAGNOSIS — Z7901 Long term (current) use of anticoagulants: Secondary | ICD-10-CM | POA: Diagnosis not present

## 2020-07-22 DIAGNOSIS — Z9889 Other specified postprocedural states: Secondary | ICD-10-CM

## 2020-07-22 DIAGNOSIS — I493 Ventricular premature depolarization: Secondary | ICD-10-CM

## 2020-07-22 DIAGNOSIS — Z8673 Personal history of transient ischemic attack (TIA), and cerebral infarction without residual deficits: Secondary | ICD-10-CM

## 2020-07-22 DIAGNOSIS — I10 Essential (primary) hypertension: Secondary | ICD-10-CM

## 2020-07-22 LAB — POCT INR: INR: 3.8 — AB (ref 2.0–3.0)

## 2020-07-22 NOTE — Progress Notes (Signed)
ID:  DANN GALICIA, DOB [birth year], MRN [id number]  PCP:  Leeroy Cha, MD  Cardiologist:  Rex Kras, DO, [facility] (established care 03/04/2020) Former Cardiology Providers: Dr. Golden Hurter Cardiothoracic Surgeon: Dr. Ricard Dillon  Date: 07/22/20 Last Office Visit: 06/02/2020  Chief Complaint  Patient presents with   Follow-up    4 WEEKS   Atrial Fibrillation   Results    HPI  Robert Lynn is a 70 y.o. male who presents to the office with a chief complaint of " paroxysmal atrial fibrillation and review test results." Patient's past medical history and cardiovascular risk factors include: Prostate cancer, dyslipidemia, Hx of melanoma, GERD, Hx of MRSA, HTN, history of CVA, Hx of MVP diagnosed in 1990 and subsequently underwent Mitral Valve Repair (2002), Long-term oral anticoagulation (since 2012 after CVA), advanced age.  He is referred to the office at the request of Leeroy Cha,* for evaluation of history of mitral valve repair and currently on oral anticoagulation.  During his recent yearly physical patient was questioned with regards to why he is currently on Coumadin long-term without any clear explanation he was referred to cardiology for further evaluation and management.  He was getting his INRs monitored regularly by his primary care provider.  It appears the patient had a mitral valve prolapse diagnosed in 1990 and underwent mitral valve repair with Dr. Ricard Dillon in 2002.  Based on the discharge summary from 2012 patient presented to [facility] ER room with symptoms of expressive aphasia and was transferred to [facility] for further evaluation of stroke.  He underwent MRI/MRA which revealed acute infarction involving the left hemisphere affecting the insular cortex, parietal, and posterior temporal lobes and the underlying white matter.  He was also noted to have remote infarcts of the right parietal and left parietal lobes per report.  He  underwent extensive cardiovascular testing and as per the discharge summary he was started on anticoagulated with Coumadin as he had 3 different infarcts involving more than 1 vascular territories.  Please refer to the discharge summary that is available on epic dating 11/20/2010.  On extended Holter monitor patient did have evidence of atrial fibrillation and given his mitral valve repair recommended Coumadin as anticoagulation of choice.  Since last office visit he is transition his INR follow-ups with our practice and is in the process of getting a home INR machine.  On the extended Holter monitor patient was also noted to have a PVC burden of approximately 4% and a couple episodes of nonsustained ventricular tachycardia.  Since then he underwent a exercise nuclear stress test and is here to review the results.  Clinically he is asymptomatic with regards to chest pain or shortness of breath.  He denies any history of intracranial bleeding, gastrointestinal bleeding.  And does not endorse any evidence of bleeding.   FUNCTIONAL STATUS: Goes to gym regularly with resistance training and cardio. Used to do 5 K prior to COVID pandemic.    ALLERGIES: No Known Allergies  MEDICATION LIST PRIOR TO VISIT: Current Meds  Medication Sig   amLODipine (NORVASC) 5 MG tablet Take 5 mg by mouth daily.   beclomethasone (QVAR) 80 MCG/ACT inhaler Inhale 1 puff into the lungs 2 (two) times daily.   fluticasone (FLONASE) 50 MCG/ACT nasal spray Place 2 sprays into both nostrils daily.   hydrochlorothiazide (HYDRODIURIL) 25 MG tablet Take 25 mg by mouth daily.   KLOR-CON M20 20 MEQ tablet TAKE 1 TABLET BY MOUTH EVERY DAY FOR 90 DAYS  metoprolol succinate (TOPROL-XL) 50 MG 24 hr tablet Take 1 tablet (50 mg total) by mouth daily. Take with or immediately following a meal. Hold if top blood pressure number less than 100 mmHg or pulse less than 60 bpm.   omeprazole (PRILOSEC) 20 MG capsule Take 20 mg by mouth daily.    rosuvastatin (CRESTOR) 5 MG tablet Take 5 mg by mouth daily.   sildenafil (REVATIO) 20 MG tablet Take 20 mg by mouth as needed.   warfarin (COUMADIN) 5 MG tablet Take 5 mg by mouth daily.     PAST MEDICAL HISTORY: Past Medical History:  Diagnosis Date   Allergy    Atrial fibrillation (HCC)    Heart murmur    Hyperlipidemia    Hypertension    Melanoma ([place])    MVP (mitral valve prolapse)    Prostate cancer (HCC)    PVC (premature ventricular contraction)    Stroke (HCC)     PAST SURGICAL HISTORY: Past Surgical History:  Procedure Laterality Date   MITRAL VALVE REPAIR  2002    FAMILY HISTORY: The patient family history includes Dementia in his mother; Emphysema in his father; Snoring in his father. He was adopted.  SOCIAL HISTORY:  The patient  reports that he has never smoked. He has never used smokeless tobacco. He reports that he does not drink alcohol and does not use drugs.  REVIEW OF SYSTEMS: Review of Systems  Constitutional: Negative for chills and fever.  HENT:  Negative for hoarse voice and nosebleeds.   Eyes:  Negative for discharge, double vision and pain.  Cardiovascular:  Negative for chest pain, claudication, dyspnea on exertion, leg swelling, near-syncope, orthopnea, palpitations, paroxysmal nocturnal dyspnea and syncope.  Respiratory:  Negative for hemoptysis and shortness of breath.   Musculoskeletal:  Negative for muscle cramps and myalgias.  Gastrointestinal:  Negative for abdominal pain, constipation, diarrhea, hematemesis, hematochezia, melena, nausea and vomiting.  Neurological:  Negative for dizziness and light-headedness.   PHYSICAL EXAM: Vitals with BMI 07/22/2020 06/02/2020 04/03/2020  Height 5' 3" 5' 3" 5' 3"  Weight 151 lbs 150 lbs 151 lbs 3 oz  BMI 26.76 22.97 98.92  Systolic 119 417 408  Diastolic 65 76 80  Pulse 63 76 72    CONSTITUTIONAL: Well-developed and well-nourished. No acute distress.  SKIN: Skin is warm and dry. No rash  noted. No cyanosis. No pallor. No jaundice HEAD: Normocephalic and atraumatic.  EYES: No scleral icterus MOUTH/THROAT: Moist oral membranes.  NECK: No JVD present. No thyromegaly noted. No carotid bruits  LYMPHATIC: No visible cervical adenopathy.  CHEST Normal respiratory effort. No intercostal retractions  LUNGS: Clear to auscultation bilaterally. No stridor. No wheezes. No rales.  CARDIOVASCULAR: Regular rate and rhythm, positive S1-S2, no murmurs rubs or gallops appreciated. ABDOMINAL: soft, nontender, nondistended, positive bowel sounds all 4 quadrants. No apparent ascites.  EXTREMITIES: No peripheral edema  HEMATOLOGIC: No significant bruising NEUROLOGIC: Oriented to person, place, and time. Nonfocal. Normal muscle tone.  PSYCHIATRIC: Normal mood and affect. Normal behavior. Cooperative  RADIOLOGY: MRI /MRA Head and Neck w/ w/o contrast: 11/19/2010: Moderate sized acute left hemisphere infarct affects the insular cortex, parietal and posterior temporal lobes, and underlying white matter.   Remote infarcts of the right parietal and left parietal lobes are observed.  There is also a small remote right posterior frontal infarct.  Remote cerebellar lacunar infarcts are visualized.  Brainstem is spared. Mild irregularity distal MCA and PCA branches consistent with intracranial atherosclerotic disease.  No proximal stenosis.  CARDIAC DATABASE: Mitral valve repair:  S/P Median sternotomy for mitral valve repair (quadrangular resection of posterior leaflet, chordal transfer from posterior to anterior leaflet x 2, artificial chordal replacement x 3, #25 Carpentier-Edwards ring annuloplasty) by Dr. Darylene Price.    EKG: 07/22/2020: Atrial fibrillation, 72 bpm,, ST depressions in the lateral leads consider lateral ischemia, without underlying injury pattern.    Echocardiogram: 04/28/2020: 1. Left ventricular ejection fraction, by estimation, is 55 to 60%. The left ventricle has normal  function. The left ventricle has no regional wall motion abnormalities. Left ventricular diastolic function could not be evaluated. Elevated left atrial  pressure. The average left ventricular global longitudinal strain is -17.5 %. 2. Right ventricular systolic function is low normal. The right ventricular size is normal. There is normal pulmonary artery systolic pressure. The estimated right ventricular systolic pressure is 85.8 mmHg. 3. Annuloplasty ring present (Carpentier-Edwards) well seated, no evidence of dehiscence. No evidence of mitral valve regurgitation. Mild mitral stenosis (peak velocity 2.56ms, MG 521mg, MVA 1.95cm2, PHT 13658m, HR 73bpm). 4. The aortic valve is tricuspid. Aortic valve regurgitation is not visualized. No aortic stenosis is present. 5. The inferior vena cava is normal in size with greater than 50% respiratory variability, suggesting right atrial pressure of 3 mmHg. Comparison(s): Prior echo 11/20/2010: LVEF 60-65%, Trivial AR, normal functioning annular ring prosthesis.  Stress Testing: Exercise treadmill stress test 03/17/2020: Exercise treadmill stress test performed using Bruce protocol.  Patient reached 11 METS, and 111% of age predicted maximum heart rate.  Exercise capacity was excellent.  No chest pain reported.  Normal heart rate and hemodynamic response. Stress EKG revealed no ischemic changes, but showed frequent PVC's. PVC's were more frequent at rest, stress, and recovery. Consider clinical correlation and workup due to frequent PVC's.  Exercise Sestamibi Stress Test 06/09/2020: Equivocal ECG stress. The patient exercised for 6 minutes and 59 seconds of a Bruce protocol, achieving approximately 8.58 METs.  Resting EKG demonstrated normal sinus rhythm. Peak EKG demonstrated normal sinus rhythm. No ST-T wave abnormalities. During exercise the peak ECG revealed frequent premature ventricular contractions, couplets, bigeminy. No chest pain. Stress terminated due to  THR achieved. There is a reversible small to moderate sized mild defect in the inferior and apical regions. Overall LV systolic function is abnormal with inferior wall hypokinesis. Stress LV EF: 43%. No previous exam available for comparison.Intermediate risk study.  Heart Catheterization: May 2012: IMPRESSION: 1. Essentially normal coronary arteries. 2. Normal left ventricular function. 3. Dilated left atrium. 4. Severe mitral regurgitation with anterior posterior mitral valve prolapse. 5. Normal pulmonary pressure. 6. Normal cardiac indices. 7. Normal oxygen saturation.  14 day extended Holter monitor: Dominant rhythm normal sinus rhythm, followed by atrial fibrillation. Heart rate 45-142 bpm. Avg HR 72 bpm. No high grade AV block or pauses (3 seconds or longer). 2 episodes of nonsustained ventricular tachycardia, 4 beats in duration, fastest 158 bpm.  Atrial fibrillation burden approximately 1% (average heart rate 99 bpm, heart rate ranging between 60- 146 bpm). Longest duration of atrial fibrillation 45 minutes and 41 seconds. Total supraventricular ectopic burden <1%. Total ventricular ectopic burden of approximately 4.2% (episodes of isolated PVCs, and PVCs in bigeminy/trigeminy pattern) Patient triggered events: 1. Underlying rhythm normal sinus with rare ventricular ectopy.  LABORATORY DATA: External Labs:  Collected: 02/20/2020 provided by PCP  Creatinine 0.92 mg/dL. eGFR: 82 mL/min per 1.73 m Lipid profile: Total cholesterol 171, triglycerides 166, HDL 52, LDL 90, non-HDL 119 AST 27, ALT 21, alkaline phosphatase 65 Hemoglobin 14.3  g/dL, hematocrit 44.4%  IMPRESSION:    ICD-10-CM   1. Abnormal nuclear stress test  R94.39     2. Paroxysmal atrial fibrillation (HCC)  I48.0 EKG 12-Lead    CANCELED: EKG 12-Lead    3. Monitoring for long-term anticoagulant use  Z51.81    Z79.01     4. H/O mitral valve repair  Z98.890     5. Premature ventricular complex  I49.3      6. Benign hypertension  I10     7. History of stroke  Z86.73     8. Hypercholesterolemia  E78.00        RECOMMENDATIONS: Robert Lynn is a 70 y.o. male whose past medical history and cardiac risk factors include: Prostate cancer, dyslipidemia, Hx of melanoma, GERD, Hx of MRSA, HTN, history of CVA, Hx of MVP diagnosed in 1990 and subsequently underwent Mitral Valve Repair (2002), Long-term oral anticoagulation (since 2012 after CVA), advanced age.  Patient recently had an extended Holter monitor to further evaluate the presence/absence of atrial fibrillation as he was on oral anticoagulation.  On the monitor he had evidence of atrial fibrillation but also noted a PVC burden of approximately 4% and episodes of NSVT.  Despite having good functional capacity for age that shared decision at the last visit was to proceed with exercise nuclear stress test to rule out reversible ischemia.  He was also started on beta-blocker therapy given the PVCs and NSVT.  The exercise nuclear stress test was reported to be an intermediate risk study with evidence of reversible ischemia in the inferior and apical regions.  Clinically patient denies any chest pain at rest or with effort related activities.  However he has noticed shortness of breath with exertion which he is just attributing to old age.  We discussed further up titration of medical therapy versus considering invasive angiography to evaluate for obstructive CAD.  Patient states that his son who is currently in the [place] is coming home and he would like to stay with him and spent some time but does not mind getting it evaluated in the near future or sooner if change in clinical status.  Patient is currently on beta-blocker therapy and statin therapy.  Since last office visit he has discontinued ramipril due to feeling tired and fatigue.  He takes all his blood pressure medications in the morning and I have asked him to take hydrochlorothiazide  and amlodipine in the morning and metoprolol at night.  Given his paroxysmal atrial fibrillation he is not currently on rate control strategy with Toprol-XL.  He is on Coumadin for thromboembolic prophylaxis.  His INR today is 3.8.  He does not endorse any evidence of bleeding.  His EKG today shows atrial fibrillation with controlled ventricular rate.  Patient states that his blood pressures at home are very well controlled with a systolic blood pressure ranging between 124-135 mmHg.  He has stopped taking ramipril as discussed above.  Continue statin therapy.  Most recent lipid profile.  Given the fact that he has atherosclerosis in multiple vascular territories and given his hx of CVA would recommend an LDL <70 mg/dL.  FINAL MEDICATION LIST END OF ENCOUNTER: No orders of the defined types were placed in this encounter.   Medications Discontinued During This Encounter  Medication Reason   ramipril (ALTACE) 10 MG capsule Error     Current Outpatient Medications:    amLODipine (NORVASC) 5 MG tablet, Take 5 mg by mouth daily., Disp: , Rfl:  beclomethasone (QVAR) 80 MCG/ACT inhaler, Inhale 1 puff into the lungs 2 (two) times daily., Disp: , Rfl:    fluticasone (FLONASE) 50 MCG/ACT nasal spray, Place 2 sprays into both nostrils daily., Disp: , Rfl:    hydrochlorothiazide (HYDRODIURIL) 25 MG tablet, Take 25 mg by mouth daily., Disp: , Rfl:    KLOR-CON M20 20 MEQ tablet, TAKE 1 TABLET BY MOUTH EVERY DAY FOR 90 DAYS, Disp: , Rfl:    metoprolol succinate (TOPROL-XL) 50 MG 24 hr tablet, Take 1 tablet (50 mg total) by mouth daily. Take with or immediately following a meal. Hold if top blood pressure number less than 100 mmHg or pulse less than 60 bpm., Disp: 90 tablet, Rfl: 0   omeprazole (PRILOSEC) 20 MG capsule, Take 20 mg by mouth daily., Disp: , Rfl:    rosuvastatin (CRESTOR) 5 MG tablet, Take 5 mg by mouth daily., Disp: , Rfl:    sildenafil (REVATIO) 20 MG tablet, Take 20 mg by mouth as  needed., Disp: , Rfl:    warfarin (COUMADIN) 5 MG tablet, Take 5 mg by mouth daily., Disp: , Rfl:   Orders Placed This Encounter  Procedures   POCT INR   EKG 12-Lead    Patient Instructions  INR above goal. HOLD today and then continue taking warfarin 2.5 mg every Mon, Wed, Fri and 5 mg all other days. Recheck INR in 3 weeks.    --Continue cardiac medications as reconciled in final medication list. --Return in about 7 weeks (around 09/09/2020) for Follow up abnormal stress test. . Or sooner if needed. --Continue follow-up with your primary care physician regarding the management of your other chronic comorbid conditions.  Patient's questions and concerns were addressed to his satisfaction. He voices understanding of the instructions provided during this encounter.   This note was created using a voice recognition software as a result there may be grammatical errors inadvertently enclosed that do not reflect the nature of this encounter. Every attempt is made to correct such errors.   Rex Kras, [place], [facility]  Pager: [phone] Office: [phone]

## 2020-07-22 NOTE — Patient Instructions (Signed)
INR above goal. HOLD today and then continue taking warfarin 2.5 mg every Mon, Wed, Fri and 5 mg all other days. Recheck INR in 3 weeks.

## 2020-08-08 ENCOUNTER — Telehealth: Payer: Self-pay

## 2020-08-08 NOTE — Telephone Encounter (Signed)
Pt called and stated that his HR has not been going much about 50 lately. Even after doing physical activity. He stated that last night it was 42 while resting. He believes he may need a smaller dose of the metoprolol.

## 2020-08-12 NOTE — Telephone Encounter (Signed)
He currently takes Toprol XL 50mg  po qday.  He can cut the tab in half so that he takes Toprol XL 25mg  po qday.   MA: Please update the MAR to reflect this change after informing the patient.   ST

## 2020-08-14 MED ORDER — METOPROLOL SUCCINATE ER 25 MG PO TB24
25.0000 mg | ORAL_TABLET | Freq: Every day | ORAL | 3 refills | Status: DC
Start: 2020-08-14 — End: 2020-09-26

## 2020-08-14 NOTE — Telephone Encounter (Signed)
Pt aware. Updating the [facility] now

## 2020-08-20 ENCOUNTER — Ambulatory Visit: Payer: Self-pay | Admitting: Pharmacist

## 2020-08-20 DIAGNOSIS — Z5181 Encounter for therapeutic drug level monitoring: Secondary | ICD-10-CM

## 2020-08-20 DIAGNOSIS — Z9889 Other specified postprocedural states: Secondary | ICD-10-CM

## 2020-08-20 DIAGNOSIS — I480 Paroxysmal atrial fibrillation: Secondary | ICD-10-CM

## 2020-08-20 DIAGNOSIS — Z7901 Long term (current) use of anticoagulants: Secondary | ICD-10-CM | POA: Diagnosis not present

## 2020-08-20 LAB — POCT INR: INR: 2.6 (ref 2.0–3.0)

## 2020-08-20 NOTE — Progress Notes (Signed)
Anticoagulation Management Robert Lynn is a 70 y.o. male who reports to the clinic for monitoring of warfarin treatment.    Indication:  Paroxysmal Atrial Fibrillation, H/o mitral valve repair  CHA2DS2 Vasc Score 4 (Age 55-74, HTN hx, Hx of stroke), HAS-BLED 2 (Age>65, Stroke hx)  Duration: indefinite Supervising physician: Rex Kras  Anticoagulation Clinic Visit History:  Patient does not report signs/symptoms of bleeding or thromboembolism   Other recent changes: No change in diet, medications, lifestyle  Pt approved for home INR monitoring through [facility]. Pt to continue checking ever 2 weeks at home moving forward.   Anticoagulation Episode Summary     Current INR goal:  2.0-3.0  TTR:  26.2 % (1.6 mo)  Next INR check:  09/01/2020  INR from last check:  2.6 (08/20/2020)  Weekly max warfarin dose:    Target end date:  Indefinite  INR check location:    Preferred lab:    Send INR reminders to:     Indications   H/O mitral valve repair [Z98.890] Monitoring for long-term anticoagulant use [Z51.81 Z79.01] Paroxysmal atrial fibrillation (HCC) [I48.0]        Comments:           No Known Allergies  Current Outpatient Medications:    amLODipine (NORVASC) 5 MG tablet, Take 5 mg by mouth daily., Disp: , Rfl:    beclomethasone (QVAR) 80 MCG/ACT inhaler, Inhale 1 puff into the lungs 2 (two) times daily., Disp: , Rfl:    fluticasone (FLONASE) 50 MCG/ACT nasal spray, Place 2 sprays into both nostrils daily., Disp: , Rfl:    hydrochlorothiazide (HYDRODIURIL) 25 MG tablet, Take 25 mg by mouth daily., Disp: , Rfl:    KLOR-CON M20 20 MEQ tablet, TAKE 1 TABLET BY MOUTH EVERY DAY FOR 90 DAYS, Disp: , Rfl:    metoprolol succinate (TOPROL XL) 25 MG 24 hr tablet, Take 1 tablet (25 mg total) by mouth daily., Disp: 90 tablet, Rfl: 3   omeprazole (PRILOSEC) 20 MG capsule, Take 20 mg by mouth daily., Disp: , Rfl:    rosuvastatin (CRESTOR) 5 MG tablet, Take 5 mg by mouth daily., Disp: ,  Rfl:    sildenafil (REVATIO) 20 MG tablet, Take 20 mg by mouth as needed., Disp: , Rfl:    warfarin (COUMADIN) 5 MG tablet, Take 5 mg by mouth daily., Disp: , Rfl:  Past Medical History:  Diagnosis Date   Allergy    Atrial fibrillation (HCC)    Heart murmur    Hyperlipidemia    Hypertension    Melanoma ([place])    MVP (mitral valve prolapse)    Prostate cancer (HCC)    PVC (premature ventricular contraction)    Stroke ([place])     ASSESSMENT  Recent Results: The most recent result is correlated with 27.5 mg per week:  Lab Results  Component Value Date   INR 2.6 08/20/2020   INR 3.8 (A) 07/22/2020   INR 2.4 06/23/2020    Anticoagulation Dosing:    INR today: Therapeutic. Improves following held dose x1. Previously stable and therapeutic. Pt continues to remain remain therapeutic on current weekly dose. Denies any complains of bleeding or bruising symptoms. Denies any other relevant changes in her diet, medications, or lifestyle. Will continue current weekly dose and continue monitoring.   PLAN Weekly dose was unchanged by  0 % to 27.5 mg per week. Continue taking 2.5 mg every Mon, Wed, Fri and 5 mg all other days. Recheck INR in 2 weeks.  Patient Instructions  INR at goal. Continue taking warfarin 2.5 mg every Mon, Wed, Fri and 5 mg all other days. Recheck INR in 2 weeks.  Patient advised to contact clinic or seek medical attention if signs/symptoms of bleeding or thromboembolism occur.  Patient verbalized understanding by repeating back information and was advised to contact me if further medication-related questions arise.   Follow-up Return in about 12 days (around 09/01/2020).  [name], PharmD  15 minutes spent face-to-face with the patient during the encounter. 50% of time spent on education, including signs/sx bleeding and clotting, as well as food and drug interactions with warfarin. 50% of time was spent on fingerprick POC INR sample collection,processing,  results determination, and documentation

## 2020-08-20 NOTE — Patient Instructions (Signed)
INR at goal. Continue taking warfarin 2.5 mg every Mon, Wed, Fri and 5 mg all other days. Recheck INR in 2 weeks.

## 2020-08-29 ENCOUNTER — Other Ambulatory Visit: Payer: Self-pay | Admitting: Cardiology

## 2020-08-29 DIAGNOSIS — I493 Ventricular premature depolarization: Secondary | ICD-10-CM

## 2020-08-29 DIAGNOSIS — I480 Paroxysmal atrial fibrillation: Secondary | ICD-10-CM

## 2020-09-01 ENCOUNTER — Ambulatory Visit: Payer: Self-pay | Admitting: Pharmacist

## 2020-09-01 DIAGNOSIS — Z7901 Long term (current) use of anticoagulants: Secondary | ICD-10-CM

## 2020-09-01 DIAGNOSIS — I480 Paroxysmal atrial fibrillation: Secondary | ICD-10-CM | POA: Diagnosis not present

## 2020-09-01 DIAGNOSIS — Z5181 Encounter for therapeutic drug level monitoring: Secondary | ICD-10-CM

## 2020-09-01 DIAGNOSIS — Z9889 Other specified postprocedural states: Secondary | ICD-10-CM | POA: Diagnosis not present

## 2020-09-01 DIAGNOSIS — J309 Allergic rhinitis, unspecified: Secondary | ICD-10-CM | POA: Diagnosis not present

## 2020-09-01 DIAGNOSIS — D6859 Other primary thrombophilia: Secondary | ICD-10-CM | POA: Diagnosis not present

## 2020-09-01 LAB — POCT INR: INR: 2.8 (ref 2.0–3.0)

## 2020-09-01 NOTE — Patient Instructions (Signed)
INR at goal. Continue taking warfarin 2.5 mg every Mon, Wed, Fri and 5 mg all other days. Recheck INR in 1 weeks.

## 2020-09-01 NOTE — Progress Notes (Signed)
Anticoagulation Management Robert Lynn is a 70 y.o. male who reports to the clinic for monitoring of warfarin treatment.    Indication:  Paroxysmal Atrial Fibrillation, H/o mitral valve repair  CHA2DS2 Vasc Score 4 (Age 70-74, HTN hx, Hx of stroke), HAS-BLED 2 (Age>65, Stroke hx)  Duration: indefinite Supervising physician: Rex Kras  Anticoagulation Clinic Visit History:  Patient does not report signs/symptoms of bleeding or thromboembolism   Other recent changes: No change in diet, medications, lifestyle  Reviewed possible interaction between tumeric and fish oil with anticoagulants.   Pt approved for home INR monitoring through [facility]. Pt to continue checking ever 2 weeks at home moving forward.   Anticoagulation Episode Summary     Current INR goal:  2.0-3.0  TTR:  40.9 % (2 mo)  Next INR check:  09/08/2020  INR from last check:  2.8 (09/01/2020)  Weekly max warfarin dose:    Target end date:  Indefinite  INR check location:    Preferred lab:    Send INR reminders to:     Indications   H/O mitral valve repair [Z98.890] Monitoring for long-term anticoagulant use [Z51.81 Z79.01] Paroxysmal atrial fibrillation (HCC) [I48.0]        Comments:           No Known Allergies  Current Outpatient Medications:    amLODipine (NORVASC) 5 MG tablet, Take 5 mg by mouth daily., Disp: , Rfl:    beclomethasone (QVAR) 80 MCG/ACT inhaler, Inhale 1 puff into the lungs 2 (two) times daily., Disp: , Rfl:    fluticasone (FLONASE) 50 MCG/ACT nasal spray, Place 2 sprays into both nostrils daily., Disp: , Rfl:    hydrochlorothiazide (HYDRODIURIL) 25 MG tablet, Take 25 mg by mouth daily., Disp: , Rfl:    KLOR-CON M20 20 MEQ tablet, TAKE 1 TABLET BY MOUTH EVERY DAY FOR 90 DAYS, Disp: , Rfl:    metoprolol succinate (TOPROL XL) 25 MG 24 hr tablet, Take 1 tablet (25 mg total) by mouth daily., Disp: 90 tablet, Rfl: 3   omeprazole (PRILOSEC) 20 MG capsule, Take 20 mg by mouth daily., Disp: ,  Rfl:    rosuvastatin (CRESTOR) 5 MG tablet, Take 5 mg by mouth daily., Disp: , Rfl:    sildenafil (REVATIO) 20 MG tablet, Take 20 mg by mouth as needed., Disp: , Rfl:    warfarin (COUMADIN) 5 MG tablet, Take 5 mg by mouth daily., Disp: , Rfl:  Past Medical History:  Diagnosis Date   Allergy    Atrial fibrillation (HCC)    Heart murmur    Hyperlipidemia    Hypertension    Melanoma ([place])    MVP (mitral valve prolapse)    Prostate cancer (HCC)    PVC (premature ventricular contraction)    Stroke ([place])     ASSESSMENT  Recent Results: The most recent result is correlated with 27.5 mg per week:  Lab Results  Component Value Date   INR 2.8 09/01/2020   INR 2.6 08/20/2020   INR 3.8 (A) 07/22/2020    Anticoagulation Dosing:    INR today: Therapeutic. Continues to remain stable on current weekly dose. Denies any complains of bleeding or bruising symptoms. Denies any other relevant changes in her diet, medications, or lifestyle. Will continue current weekly dose and continue monitoring.   PLAN Weekly dose was unchanged by  0 % to 27.5 mg per week. Continue taking 2.5 mg every Mon, Wed, Fri and 5 mg all other days. Recheck INR in 1 weeks.  Patient Instructions  INR at goal. Continue taking warfarin 2.5 mg every Mon, Wed, Fri and 5 mg all other days. Recheck INR in 1 weeks.  Patient advised to contact clinic or seek medical attention if signs/symptoms of bleeding or thromboembolism occur.  Patient verbalized understanding by repeating back information and was advised to contact me if further medication-related questions arise.   Follow-up Return in about 1 week (around 09/08/2020).  [name], PharmD  15 minutes spent face-to-face with the patient during the encounter. 50% of time spent on education, including signs/sx bleeding and clotting, as well as food and drug interactions with warfarin. 50% of time was spent on fingerprick POC INR sample collection,processing, results  determination, and documentation

## 2020-09-08 ENCOUNTER — Ambulatory Visit: Payer: Self-pay | Admitting: Pharmacist

## 2020-09-08 DIAGNOSIS — I480 Paroxysmal atrial fibrillation: Secondary | ICD-10-CM

## 2020-09-08 DIAGNOSIS — Z7901 Long term (current) use of anticoagulants: Secondary | ICD-10-CM | POA: Diagnosis not present

## 2020-09-08 DIAGNOSIS — U071 COVID-19: Secondary | ICD-10-CM | POA: Diagnosis not present

## 2020-09-08 DIAGNOSIS — Z5181 Encounter for therapeutic drug level monitoring: Secondary | ICD-10-CM | POA: Diagnosis not present

## 2020-09-08 DIAGNOSIS — Z9889 Other specified postprocedural states: Secondary | ICD-10-CM

## 2020-09-08 LAB — POCT INR: INR: 2.2 (ref 2.0–3.0)

## 2020-09-08 NOTE — Progress Notes (Signed)
Anticoagulation Management Robert Lynn is a 70 y.o. male who reports to the clinic for monitoring of warfarin treatment.    Indication:  Paroxysmal Atrial Fibrillation, H/o mitral valve repair  CHA2DS2 Vasc Score 4 (Age 53-74, HTN hx, Hx of stroke), HAS-BLED 2 (Age>65, Stroke hx)  Duration: indefinite Supervising physician: Rex Kras  Anticoagulation Clinic Visit History:  Patient does not report signs/symptoms of bleeding or thromboembolism   Other recent changes: No change in diet, medications, lifestyle  Reviewed possible interaction between tumeric and fish oil with anticoagulants.   Pt approved for home INR monitoring through [facility]. Pt to continue checking ever 2 weeks at home moving forward.   Anticoagulation Episode Summary     Current INR goal:  2.0-3.0  TTR:  46.8 % (2.2 mo)  Next INR check:  09/15/2020  INR from last check:  2.2 (09/08/2020)  Weekly max warfarin dose:    Target end date:  Indefinite  INR check location:    Preferred lab:    Send INR reminders to:     Indications   H/O mitral valve repair [Z98.890] Monitoring for long-term anticoagulant use [Z51.81 Z79.01] Paroxysmal atrial fibrillation (HCC) [I48.0]        Comments:           No Known Allergies  Current Outpatient Medications:    amLODipine (NORVASC) 5 MG tablet, Take 5 mg by mouth daily., Disp: , Rfl:    beclomethasone (QVAR) 80 MCG/ACT inhaler, Inhale 1 puff into the lungs 2 (two) times daily., Disp: , Rfl:    fluticasone (FLONASE) 50 MCG/ACT nasal spray, Place 2 sprays into both nostrils daily., Disp: , Rfl:    hydrochlorothiazide (HYDRODIURIL) 25 MG tablet, Take 25 mg by mouth daily., Disp: , Rfl:    KLOR-CON M20 20 MEQ tablet, TAKE 1 TABLET BY MOUTH EVERY DAY FOR 90 DAYS, Disp: , Rfl:    metoprolol succinate (TOPROL XL) 25 MG 24 hr tablet, Take 1 tablet (25 mg total) by mouth daily., Disp: 90 tablet, Rfl: 3   omeprazole (PRILOSEC) 20 MG capsule, Take 20 mg by mouth daily., Disp: ,  Rfl:    rosuvastatin (CRESTOR) 5 MG tablet, Take 5 mg by mouth daily., Disp: , Rfl:    sildenafil (REVATIO) 20 MG tablet, Take 20 mg by mouth as needed., Disp: , Rfl:    warfarin (COUMADIN) 5 MG tablet, Take 5 mg by mouth daily., Disp: , Rfl:  Past Medical History:  Diagnosis Date   Allergy    Atrial fibrillation (HCC)    Heart murmur    Hyperlipidemia    Hypertension    Melanoma ([place])    MVP (mitral valve prolapse)    Prostate cancer (HCC)    PVC (premature ventricular contraction)    Stroke ([place])     ASSESSMENT  Recent Results: The most recent result is correlated with 27.5 mg per week:  Lab Results  Component Value Date   INR 2.2 09/08/2020   INR 2.8 09/01/2020   INR 2.6 08/20/2020    Anticoagulation Dosing:    INR today: Therapeutic. Continues to remain stable on current weekly dose. Denies any complains of bleeding or bruising symptoms. Denies any other relevant changes in her diet, medications, or lifestyle. Will continue current weekly dose and continue monitoring.   PLAN Weekly dose was unchanged by  0 % to 27.5 mg per week. Continue taking 2.5 mg every Mon, Wed, Fri and 5 mg all other days. Recheck INR in 1 weeks.  Patient Instructions  INR at goal. Continue taking warfarin 2.5 mg every Mon, Wed, Fri and 5 mg all other days. Recheck INR in 1 weeks.  Patient advised to contact clinic or seek medical attention if signs/symptoms of bleeding or thromboembolism occur.  Patient verbalized understanding by repeating back information and was advised to contact me if further medication-related questions arise.   Follow-up Return in about 1 week (around 09/15/2020).  [name], PharmD  15 minutes spent face-to-face with the patient during the encounter. 50% of time spent on education, including signs/sx bleeding and clotting, as well as food and drug interactions with warfarin. 50% of time was spent on fingerprick POC INR sample collection,processing, results  determination, and documentation

## 2020-09-08 NOTE — Patient Instructions (Signed)
INR at goal. Continue taking warfarin 2.5 mg every Mon, Wed, Fri and 5 mg all other days. Recheck INR in 1 weeks.

## 2020-09-09 ENCOUNTER — Ambulatory Visit: Payer: BC Managed Care – PPO | Admitting: Cardiology

## 2020-09-15 ENCOUNTER — Ambulatory Visit: Payer: Self-pay | Admitting: Pharmacist

## 2020-09-15 DIAGNOSIS — Z7901 Long term (current) use of anticoagulants: Secondary | ICD-10-CM | POA: Diagnosis not present

## 2020-09-15 DIAGNOSIS — Z5181 Encounter for therapeutic drug level monitoring: Secondary | ICD-10-CM

## 2020-09-15 DIAGNOSIS — I480 Paroxysmal atrial fibrillation: Secondary | ICD-10-CM

## 2020-09-15 DIAGNOSIS — Z9889 Other specified postprocedural states: Secondary | ICD-10-CM

## 2020-09-15 LAB — POCT INR: INR: 4.3 — AB (ref 2.0–3.0)

## 2020-09-15 NOTE — Progress Notes (Signed)
Anticoagulation Management Robert Lynn is a 70 y.o. male who reports to the clinic for monitoring of warfarin treatment.    Indication:  Paroxysmal Atrial Fibrillation, H/o mitral valve repair  CHA2DS2 Vasc Score 4 (Age 50-74, HTN hx, Hx of stroke), HAS-BLED 2 (Age>65, Stroke hx)  Duration: indefinite Supervising physician: Rex Kras  Anticoagulation Clinic Visit History:  Patient does not report signs/symptoms of bleeding or thromboembolism   Other recent changes: No change in diet, medications, lifestyle  Pt reports that he was diagnosed with COVID last week. Was started on Paxlovid. Finished his course last week. Reports recovering well with no lingering symptoms.   Pt approved for home INR monitoring through [facility]. Pt to continue checking ever 2 weeks at home moving forward.   Anticoagulation Episode Summary     Current INR goal:  2.0-3.0  TTR:  46.0 % (2.5 mo)  Next INR check:  09/22/2020  INR from last check:  4.3 (09/15/2020)  Weekly max warfarin dose:    Target end date:  Indefinite  INR check location:    Preferred lab:    Send INR reminders to:     Indications   H/O mitral valve repair [Z98.890] Monitoring for long-term anticoagulant use [Z51.81 Z79.01] Paroxysmal atrial fibrillation (HCC) [I48.0]        Comments:           No Known Allergies  Current Outpatient Medications:    amLODipine (NORVASC) 5 MG tablet, Take 5 mg by mouth daily., Disp: , Rfl:    beclomethasone (QVAR) 80 MCG/ACT inhaler, Inhale 1 puff into the lungs 2 (two) times daily., Disp: , Rfl:    fluticasone (FLONASE) 50 MCG/ACT nasal spray, Place 2 sprays into both nostrils daily., Disp: , Rfl:    hydrochlorothiazide (HYDRODIURIL) 25 MG tablet, Take 25 mg by mouth daily., Disp: , Rfl:    KLOR-CON M20 20 MEQ tablet, TAKE 1 TABLET BY MOUTH EVERY DAY FOR 90 DAYS, Disp: , Rfl:    metoprolol succinate (TOPROL XL) 25 MG 24 hr tablet, Take 1 tablet (25 mg total) by mouth daily., Disp: 90 tablet,  Rfl: 3   omeprazole (PRILOSEC) 20 MG capsule, Take 20 mg by mouth daily., Disp: , Rfl:    rosuvastatin (CRESTOR) 5 MG tablet, Take 5 mg by mouth daily., Disp: , Rfl:    sildenafil (REVATIO) 20 MG tablet, Take 20 mg by mouth as needed., Disp: , Rfl:    warfarin (COUMADIN) 5 MG tablet, Take 5 mg by mouth daily., Disp: , Rfl:  Past Medical History:  Diagnosis Date   Allergy    Atrial fibrillation (HCC)    Heart murmur    Hyperlipidemia    Hypertension    Melanoma ([place])    MVP (mitral valve prolapse)    Prostate cancer (HCC)    PVC (premature ventricular contraction)    Stroke ([place])     ASSESSMENT  Recent Results: The most recent result is correlated with 27.5 mg per week:  Lab Results  Component Value Date   INR 4.3 (A) 09/15/2020   INR 2.2 09/08/2020   INR 2.8 09/01/2020    Anticoagulation Dosing:   INR today: Supratherapeutic. Known increased anticoagulation effect in setting of recent Paxlovid use. Pt was previously stable and controlled on current weekly dose. Denies any complains of bleeding or bruising symptoms. Denies any other relevant changes in her diet, medications, or lifestyle. Will dose adjust in setting of recent Paxlovid use and continue close monitoring.    PLAN  Weekly dose was unchanged by  0 % to 27.5 mg per week. HOLD today and take 2.5 mg tomorrow and then continue taking 2.5 mg every Mon, Wed, Fri and 5 mg all other days. Recheck INR in 1 weeks.   Patient Instructions  INR above goal. HOLD today and take 2.5 mg tomorrow, then continue taking warfarin 2.5 mg every Mon, Wed, Fri and 5 mg all other days. Recheck INR in 1 weeks.  Patient advised to contact clinic or seek medical attention if signs/symptoms of bleeding or thromboembolism occur.  Patient verbalized understanding by repeating back information and was advised to contact me if further medication-related questions arise.   Follow-up Return in about 1 week (around 09/22/2020).  [name],  PharmD  15 minutes spent face-to-face with the patient during the encounter. 50% of time spent on education, including signs/sx bleeding and clotting, as well as food and drug interactions with warfarin. 50% of time was spent on fingerprick POC INR sample collection,processing, results determination, and documentation

## 2020-09-15 NOTE — Patient Instructions (Signed)
INR above goal. HOLD today and take 2.5 mg tomorrow, then continue taking warfarin 2.5 mg every Mon, Wed, Fri and 5 mg all other days. Recheck INR in 1 weeks.

## 2020-09-18 ENCOUNTER — Ambulatory Visit: Payer: BC Managed Care – PPO | Admitting: Cardiology

## 2020-09-22 ENCOUNTER — Ambulatory Visit: Payer: Self-pay | Admitting: Pharmacist

## 2020-09-22 DIAGNOSIS — Z9889 Other specified postprocedural states: Secondary | ICD-10-CM | POA: Diagnosis not present

## 2020-09-22 DIAGNOSIS — Z8679 Personal history of other diseases of the circulatory system: Secondary | ICD-10-CM

## 2020-09-22 DIAGNOSIS — Z7901 Long term (current) use of anticoagulants: Secondary | ICD-10-CM

## 2020-09-22 DIAGNOSIS — Z5181 Encounter for therapeutic drug level monitoring: Secondary | ICD-10-CM | POA: Diagnosis not present

## 2020-09-22 DIAGNOSIS — I480 Paroxysmal atrial fibrillation: Secondary | ICD-10-CM | POA: Diagnosis not present

## 2020-09-22 LAB — POCT INR: INR: 3.4 — AB (ref 2.0–3.0)

## 2020-09-22 NOTE — Patient Instructions (Signed)
INR above goal. Decrease weekly dose to 5 mg Tue, Thu, Sat, 2.5 mg all other days. Recheck INR in 1 week.

## 2020-09-22 NOTE — Progress Notes (Signed)
Anticoagulation Management Robert Lynn is a 70 y.o. male who reports to the clinic for monitoring of warfarin treatment.    Indication:  Paroxysmal Atrial Fibrillation, H/o mitral valve repair  CHA2DS2 Vasc Score 4 (Age 16-74, HTN hx, Hx of stroke), HAS-BLED 2 (Age>65, Stroke hx)  Duration: indefinite Supervising physician: Rex Kras  Anticoagulation Clinic Visit History:  Patient does not report signs/symptoms of bleeding or thromboembolism   Other recent changes: No change in diet, medications, lifestyle  Reports that appetite has been improving slowly since recovering from recent COVID infection. Planing on returning to previous intake of salad and broccoli.   Anticoagulation Episode Summary     Current INR goal:  2.0-3.0  TTR:  42.1 % (2.7 mo)  Next INR check:  09/29/2020  INR from last check:  3.4 (09/22/2020)  Weekly max warfarin dose:    Target end date:  Indefinite  INR check location:    Preferred lab:    Send INR reminders to:     Indications   H/O mitral valve repair [Z98.890] Monitoring for long-term anticoagulant use [Z51.81 Z79.01] Paroxysmal atrial fibrillation (HCC) [I48.0]        Comments:           No Known Allergies  Current Outpatient Medications:    amLODipine (NORVASC) 5 MG tablet, Take 5 mg by mouth daily., Disp: , Rfl:    beclomethasone (QVAR) 80 MCG/ACT inhaler, Inhale 1 puff into the lungs 2 (two) times daily., Disp: , Rfl:    fluticasone (FLONASE) 50 MCG/ACT nasal spray, Place 2 sprays into both nostrils daily., Disp: , Rfl:    hydrochlorothiazide (HYDRODIURIL) 25 MG tablet, Take 25 mg by mouth daily., Disp: , Rfl:    KLOR-CON M20 20 MEQ tablet, TAKE 1 TABLET BY MOUTH EVERY DAY FOR 90 DAYS, Disp: , Rfl:    metoprolol succinate (TOPROL XL) 25 MG 24 hr tablet, Take 1 tablet (25 mg total) by mouth daily., Disp: 90 tablet, Rfl: 3   omeprazole (PRILOSEC) 20 MG capsule, Take 20 mg by mouth daily., Disp: , Rfl:    rosuvastatin (CRESTOR) 5 MG  tablet, Take 5 mg by mouth daily., Disp: , Rfl:    sildenafil (REVATIO) 20 MG tablet, Take 20 mg by mouth as needed., Disp: , Rfl:    warfarin (COUMADIN) 5 MG tablet, Take 5 mg by mouth daily., Disp: , Rfl:  Past Medical History:  Diagnosis Date   Allergy    Atrial fibrillation (HCC)    Heart murmur    Hyperlipidemia    Hypertension    Melanoma ([place])    MVP (mitral valve prolapse)    Prostate cancer (HCC)    PVC (premature ventricular contraction)    Stroke ([place])     ASSESSMENT  Recent Results: The most recent result is correlated with 22.5 mg per week:  Lab Results  Component Value Date   INR 3.4 (A) 09/22/2020   INR 4.3 (A) 09/15/2020   INR 2.2 09/08/2020    Anticoagulation Dosing:   INR today: Supratherapeutic. Trending down after recent dose adjustment. Previously elevated due to Paxlovid use. In setting of persistent supratheraputic INR reading, will adjust weekly dose and close monitoring. As pt's appetite returns, may consider returning to previously stable dose of 27.5 mg/week. Denies any complains of bleeding or bruising symptoms. Denies any other relevant changes in her diet, medications, or lifestyle. Marland Kitchen    PLAN Weekly dose was decreased by  9.1 % to 25 mg per week. Decrease weekly  dose to 5 mg every Tues. Thurs, Sat and 2.5 mg all other days. Recheck INR in 1 weeks.   Patient Instructions  INR above goal. Decrease weekly dose to 5 mg Tue, Thu, Sat, 2.5 mg all other days. Recheck INR in 1 week.  Patient advised to contact clinic or seek medical attention if signs/symptoms of bleeding or thromboembolism occur.  Patient verbalized understanding by repeating back information and was advised to contact me if further medication-related questions arise.   Follow-up Return in about 1 week (around 09/29/2020).  [name], PharmD  15 minutes spent face-to-face with the patient during the encounter. 50% of time spent on education, including signs/sx bleeding and  clotting, as well as food and drug interactions with warfarin. 50% of time was spent on fingerprick POC INR sample collection,processing, results determination, and documentation

## 2020-09-26 ENCOUNTER — Other Ambulatory Visit: Payer: Self-pay | Admitting: Cardiology

## 2020-09-26 ENCOUNTER — Other Ambulatory Visit: Payer: Self-pay | Admitting: Student

## 2020-09-26 DIAGNOSIS — I493 Ventricular premature depolarization: Secondary | ICD-10-CM

## 2020-09-26 DIAGNOSIS — I480 Paroxysmal atrial fibrillation: Secondary | ICD-10-CM

## 2020-09-26 MED ORDER — METOPROLOL SUCCINATE ER 25 MG PO TB24: 25.0000 mg | ORAL_TABLET | Freq: Every day | ORAL | 3 refills | Status: AC

## 2020-09-29 ENCOUNTER — Ambulatory Visit: Payer: Self-pay | Admitting: Pharmacist

## 2020-09-29 DIAGNOSIS — Z7901 Long term (current) use of anticoagulants: Secondary | ICD-10-CM

## 2020-09-29 DIAGNOSIS — Z8679 Personal history of other diseases of the circulatory system: Secondary | ICD-10-CM

## 2020-09-29 DIAGNOSIS — Z5181 Encounter for therapeutic drug level monitoring: Secondary | ICD-10-CM

## 2020-09-29 DIAGNOSIS — Z9889 Other specified postprocedural states: Secondary | ICD-10-CM

## 2020-09-29 DIAGNOSIS — I480 Paroxysmal atrial fibrillation: Secondary | ICD-10-CM

## 2020-09-29 LAB — POCT INR: INR: 2.8 (ref 2.0–3.0)

## 2020-09-29 NOTE — Progress Notes (Signed)
Anticoagulation Management Robert Lynn is a 70 y.o. male who reports to the clinic for monitoring of warfarin treatment.    Indication:  Paroxysmal Atrial Fibrillation, H/o mitral valve repair  CHA2DS2 Vasc Score 4 (Age 70-74, HTN hx, Hx of stroke), HAS-BLED 2 (Age>65, Stroke hx)  Duration: indefinite Supervising physician: Rex Kras  Anticoagulation Clinic Visit History:  Patient does not report signs/symptoms of bleeding or thromboembolism   Other recent changes: No change in diet, medications, lifestyle  Reports that appetite has been improving slowly since recovering from recent COVID infection. Back to baseline appetite.   Anticoagulation Episode Summary     Current INR goal:  2.0-3.0  TTR:  41.4 % (2.9 mo)  Next INR check:  10/06/2020  INR from last check:  2.8 (09/29/2020)  Weekly max warfarin dose:    Target end date:  Indefinite  INR check location:    Preferred lab:    Send INR reminders to:     Indications   H/O mitral valve repair [Z98.890] Monitoring for long-term anticoagulant use [Z51.81 Z79.01] Paroxysmal atrial fibrillation (HCC) [I48.0]        Comments:           No Known Allergies  Current Outpatient Medications:    amLODipine (NORVASC) 5 MG tablet, Take 5 mg by mouth daily., Disp: , Rfl:    beclomethasone (QVAR) 80 MCG/ACT inhaler, Inhale 1 puff into the lungs 2 (two) times daily., Disp: , Rfl:    fluticasone (FLONASE) 50 MCG/ACT nasal spray, Place 2 sprays into both nostrils daily., Disp: , Rfl:    hydrochlorothiazide (HYDRODIURIL) 25 MG tablet, Take 25 mg by mouth daily., Disp: , Rfl:    KLOR-CON M20 20 MEQ tablet, TAKE 1 TABLET BY MOUTH EVERY DAY FOR 90 DAYS, Disp: , Rfl:    metoprolol succinate (TOPROL XL) 25 MG 24 hr tablet, Take 1 tablet (25 mg total) by mouth daily., Disp: 90 tablet, Rfl: 3   omeprazole (PRILOSEC) 20 MG capsule, Take 20 mg by mouth daily., Disp: , Rfl:    rosuvastatin (CRESTOR) 5 MG tablet, Take 5 mg by mouth daily.,  Disp: , Rfl:    sildenafil (REVATIO) 20 MG tablet, Take 20 mg by mouth as needed., Disp: , Rfl:    warfarin (COUMADIN) 5 MG tablet, Take 5 mg by mouth daily., Disp: , Rfl:  Past Medical History:  Diagnosis Date   Allergy    Atrial fibrillation (HCC)    Heart murmur    Hyperlipidemia    Hypertension    Melanoma ([place])    MVP (mitral valve prolapse)    Prostate cancer (HCC)    PVC (premature ventricular contraction)    Stroke ([place])     ASSESSMENT  Recent Results: The most recent result is correlated with 25 mg per week:  Lab Results  Component Value Date   INR 2.8 09/29/2020   INR 3.4 (A) 09/22/2020   INR 4.3 (A) 09/15/2020    Anticoagulation Dosing:   INR today: Therapeutic. Improved following recent weekly dose decrease. Denies any complains of bleeding or bruising symptoms. Denies any other relevant changes in her diet, medications, or lifestyle. Will continue current weekly dose and continue close monitoring.    PLAN Weekly dose was unchanged by  0 % to 25 mg per week. Continue current weekly dose of 5 mg every Tues. Thurs, Sat and 2.5 mg all other days. Recheck INR in 1 weeks.   Patient Instructions  INR at goal. Continue current weekly dose  of 5 mg Tue, Thu, Sat, 2.5 mg all other days. Recheck INR in 1 week.  Patient advised to contact clinic or seek medical attention if signs/symptoms of bleeding or thromboembolism occur.  Patient verbalized understanding by repeating back information and was advised to contact me if further medication-related questions arise.   Follow-up Return in about 1 week (around 10/06/2020).  [name], PharmD  15 minutes spent face-to-face with the patient during the encounter. 50% of time spent on education, including signs/sx bleeding and clotting, as well as food and drug interactions with warfarin. 50% of time was spent on fingerprick POC INR sample collection,processing, results determination, and documentation

## 2020-09-29 NOTE — Patient Instructions (Signed)
INR at goal. Continue current weekly dose of 5 mg Tue, Thu, Sat, 2.5 mg all other days. Recheck INR in 1 week.

## 2020-10-06 ENCOUNTER — Ambulatory Visit: Payer: Self-pay | Admitting: Pharmacist

## 2020-10-06 DIAGNOSIS — Z5181 Encounter for therapeutic drug level monitoring: Secondary | ICD-10-CM

## 2020-10-06 DIAGNOSIS — Z9889 Other specified postprocedural states: Secondary | ICD-10-CM

## 2020-10-06 DIAGNOSIS — Z7901 Long term (current) use of anticoagulants: Secondary | ICD-10-CM

## 2020-10-06 DIAGNOSIS — I480 Paroxysmal atrial fibrillation: Secondary | ICD-10-CM

## 2020-10-06 LAB — POCT INR: INR: 2.6 (ref 2.0–3.0)

## 2020-10-06 NOTE — Patient Instructions (Signed)
INR at goal. Continue current weekly dose of 5 mg Tue, Thu, Sat, 2.5 mg all other days. Recheck INR in 1 week.

## 2020-10-06 NOTE — Progress Notes (Signed)
Anticoagulation Management Robert Lynn is a 70 y.o. male who reports to the clinic for monitoring of warfarin treatment.    Indication:  Paroxysmal Atrial Fibrillation, H/o mitral valve repair  CHA2DS2 Vasc Score 4 (Age 31-74, HTN hx, Hx of stroke), HAS-BLED 2 (Age>65, Stroke hx)  Duration: indefinite Supervising physician: Rex Kras  Anticoagulation Clinic Visit History:  Patient does not report signs/symptoms of bleeding or thromboembolism   Other recent changes: No change in diet, medications, lifestyle  Continues to do well without any noted changes.   Anticoagulation Episode Summary     Current INR goal:  2.0-3.0  TTR:  45.6 % (3.2 mo)  Next INR check:  10/13/2020  INR from last check:  2.6 (10/06/2020)  Weekly max warfarin dose:    Target end date:  Indefinite  INR check location:    Preferred lab:    Send INR reminders to:     Indications   H/O mitral valve repair [Z98.890] Monitoring for long-term anticoagulant use [Z51.81 Z79.01] Paroxysmal atrial fibrillation (HCC) [I48.0]        Comments:           No Known Allergies  Current Outpatient Medications:    amLODipine (NORVASC) 5 MG tablet, Take 5 mg by mouth daily., Disp: , Rfl:    beclomethasone (QVAR) 80 MCG/ACT inhaler, Inhale 1 puff into the lungs 2 (two) times daily., Disp: , Rfl:    fluticasone (FLONASE) 50 MCG/ACT nasal spray, Place 2 sprays into both nostrils daily., Disp: , Rfl:    hydrochlorothiazide (HYDRODIURIL) 25 MG tablet, Take 25 mg by mouth daily., Disp: , Rfl:    KLOR-CON M20 20 MEQ tablet, TAKE 1 TABLET BY MOUTH EVERY DAY FOR 90 DAYS, Disp: , Rfl:    metoprolol succinate (TOPROL XL) 25 MG 24 hr tablet, Take 1 tablet (25 mg total) by mouth daily., Disp: 90 tablet, Rfl: 3   omeprazole (PRILOSEC) 20 MG capsule, Take 20 mg by mouth daily., Disp: , Rfl:    rosuvastatin (CRESTOR) 5 MG tablet, Take 5 mg by mouth daily., Disp: , Rfl:    sildenafil (REVATIO) 20 MG tablet, Take 20 mg by mouth as  needed., Disp: , Rfl:    warfarin (COUMADIN) 5 MG tablet, Take 5 mg by mouth daily., Disp: , Rfl:  Past Medical History:  Diagnosis Date   Allergy    Atrial fibrillation (HCC)    Heart murmur    Hyperlipidemia    Hypertension    Melanoma ([place])    MVP (mitral valve prolapse)    Prostate cancer (HCC)    PVC (premature ventricular contraction)    Stroke ([place])     ASSESSMENT  Recent Results: The most recent result is correlated with 25 mg per week:  Lab Results  Component Value Date   INR 2.6 10/06/2020   INR 2.8 09/29/2020   INR 3.4 (A) 09/22/2020    Anticoagulation Dosing:   INR today: Therapeutic. Continues to remain therapeutic on current weekly dose Denies any complains of bleeding or bruising symptoms. Denies any other relevant changes in her diet, medications, or lifestyle. Will continue current weekly dose and continue close monitoring. Previously maintained on 27.5 mg/week prior to COVID infection and starting Paxlovid. If INR continues to trend down, will consider increasing weekly dose to prior maintenance dose.   PLAN Weekly dose was unchanged by  0 % to 25 mg per week. Continue current weekly dose of 5 mg every Tues. Thurs, Sat and 2.5 mg all other  days. Recheck INR in 1 weeks.   Patient Instructions  INR at goal. Continue current weekly dose of 5 mg Tue, Thu, Sat, 2.5 mg all other days. Recheck INR in 1 week.  Patient advised to contact clinic or seek medical attention if signs/symptoms of bleeding or thromboembolism occur.  Patient verbalized understanding by repeating back information and was advised to contact me if further medication-related questions arise.   Follow-up Return in about 1 week (around 10/13/2020).  [name], PharmD  15 minutes spent face-to-face with the patient during the encounter. 50% of time spent on education, including signs/sx bleeding and clotting, as well as food and drug interactions with warfarin. 50% of time was spent on  fingerprick POC INR sample collection,processing, results determination, and documentation

## 2020-10-09 ENCOUNTER — Ambulatory Visit: Payer: BC Managed Care – PPO | Admitting: Cardiology

## 2020-10-09 ENCOUNTER — Other Ambulatory Visit: Payer: Self-pay

## 2020-10-09 ENCOUNTER — Encounter: Payer: Self-pay | Admitting: Cardiology

## 2020-10-09 VITALS — BP 117/66 | HR 60 | Temp 98.2°F | Resp 16 | Ht 63.0 in | Wt 152.0 lb

## 2020-10-09 DIAGNOSIS — Z5181 Encounter for therapeutic drug level monitoring: Secondary | ICD-10-CM

## 2020-10-09 DIAGNOSIS — Z8673 Personal history of transient ischemic attack (TIA), and cerebral infarction without residual deficits: Secondary | ICD-10-CM

## 2020-10-09 DIAGNOSIS — I10 Essential (primary) hypertension: Secondary | ICD-10-CM

## 2020-10-09 DIAGNOSIS — Z9889 Other specified postprocedural states: Secondary | ICD-10-CM | POA: Diagnosis not present

## 2020-10-09 DIAGNOSIS — E7800 Pure hypercholesterolemia, unspecified: Secondary | ICD-10-CM

## 2020-10-09 DIAGNOSIS — R9439 Abnormal result of other cardiovascular function study: Secondary | ICD-10-CM

## 2020-10-09 DIAGNOSIS — I480 Paroxysmal atrial fibrillation: Secondary | ICD-10-CM | POA: Diagnosis not present

## 2020-10-09 DIAGNOSIS — I493 Ventricular premature depolarization: Secondary | ICD-10-CM

## 2020-10-09 DIAGNOSIS — Z7901 Long term (current) use of anticoagulants: Secondary | ICD-10-CM

## 2020-10-09 NOTE — Progress Notes (Signed)
ID:  Robert Lynn, DOB 20-Apr-1950, MRN [id number]  PCP:  Leeroy Cha, MD  Cardiologist:  Rex Kras, DO, [facility] (established care 03/04/2020) Former Cardiology Providers: Dr. Golden Hurter Cardiothoracic Surgeon: Dr. Ricard Dillon  Date: 10/09/20 Last Office Visit: 07/22/2020  Chief Complaint  Patient presents with   Follow-up    History of abnormal nuclear stress test    HPI  TAMOTSU Lynn is a 70 y.o. male who presents to the office with a chief complaint of " history of abnormal nuclear stress test." Patient's past medical history and cardiovascular risk factors include: Prostate cancer, dyslipidemia, Hx of melanoma, GERD, Hx of MRSA, HTN, history of CVA, Hx of MVP diagnosed in 1990 and subsequently underwent Mitral Valve Repair (2002), Long-term oral anticoagulation (since 2012 after CVA), advanced age.  He is referred to the office at the request of Leeroy Cha,* for evaluation of history of mitral valve repair and currently on oral anticoagulation.  Patient has a history of mitral valve prolapse diagnosed in 1990 and underwent mitral valve repair with Dr. Ricard Dillon in 2002.  Then in 2012 presented to the ED with expressive aphasia and was diagnosed with a stroke.  MRI/MRA noted acute infarctions involving the left hemisphere affecting the insular cortex, parietal, and posterior temporal lobes and underlying white matter.  He also was noted to have remote infarcts in the right parietal and left parietal lobes per report.  He underwent extensive cardiovascular testing and at discharge was placed on Coumadin as he had 3 different infarcts involving more than 1 vascular territory please refer to the discharge summary available in epic dated 11/19/2020.  He was referred to me in January 2022 with a question of the need to continue oral anticoagulation long-term.  The shared decision was to proceed with a Holter monitor which noted presence of atrial fibrillation and given his  underlying mitral valve disease history decision was to continue Coumadin for thromboembolic prophylaxis.  He is currently enrolled in in-home monitoring for his INR Holter monitor patient was also noted to have NSVT and underwent exercise treadmill stress test.  He has excellent functional capacity for age but was noted to have significant ectopy during the study details noted below for the office.  Subsequently he underwent exercise nuclear stress test which notes reversible ischemia in the inferior and apical regions.  At the last office visit patient wanted to continue conservative management.  He now presents for follow-up.  Patient states that he denies chest pain at rest or with effort related activities.  He does experience shortness of breath which is more noticeable since last office visit.  Shortness of breath has been surfacing more often with overexertion.  He denies orthopnea, paroxysmal nocturnal dyspnea or lower extremity swelling.  Patient denies history of intracranial bleeding, gastrointestinal bleeding, does not endorsed evidence of bleeding and he is currently on oral anticoagulation.   FUNCTIONAL STATUS: Goes to gym regularly with resistance training and cardio. Used to do 5 K prior to COVID pandemic.    ALLERGIES: No Known Allergies  MEDICATION LIST PRIOR TO VISIT: Current Meds  Medication Sig   amLODipine (NORVASC) 5 MG tablet Take 5 mg by mouth daily.   beclomethasone (QVAR) 80 MCG/ACT inhaler Inhale 1 puff into the lungs 2 (two) times daily.   fluticasone (FLONASE) 50 MCG/ACT nasal spray Place 2 sprays into both nostrils daily.   hydrochlorothiazide (HYDRODIURIL) 25 MG tablet Take 25 mg by mouth daily.   KLOR-CON M20 20 MEQ tablet TAKE 1  TABLET BY MOUTH EVERY DAY FOR 90 DAYS   metoprolol succinate (TOPROL XL) 25 MG 24 hr tablet Take 1 tablet (25 mg total) by mouth daily.   omeprazole (PRILOSEC) 20 MG capsule Take 20 mg by mouth daily.   rosuvastatin (CRESTOR) 5 MG  tablet Take 5 mg by mouth daily.   sildenafil (REVATIO) 20 MG tablet Take 20 mg by mouth as needed.   warfarin (COUMADIN) 5 MG tablet Take 5 mg by mouth daily.     PAST MEDICAL HISTORY: Past Medical History:  Diagnosis Date   Allergy    Atrial fibrillation (HCC)    Heart murmur    Hyperlipidemia    Hypertension    Melanoma ([place])    MVP (mitral valve prolapse)    Prostate cancer (HCC)    PVC (premature ventricular contraction)    Stroke (HCC)     PAST SURGICAL HISTORY: Past Surgical History:  Procedure Laterality Date   MITRAL VALVE REPAIR  2002    FAMILY HISTORY: The patient family history includes Dementia in his mother; Emphysema in his father; Snoring in his father. He was adopted.  SOCIAL HISTORY:  The patient  reports that he has never smoked. He has never used smokeless tobacco. He reports that he does not drink alcohol and does not use drugs.  REVIEW OF SYSTEMS: Review of Systems  Constitutional: Negative for chills and fever.  HENT:  Negative for hoarse voice and nosebleeds.   Eyes:  Negative for discharge, double vision and pain.  Cardiovascular:  Negative for chest pain, claudication, dyspnea on exertion, leg swelling, near-syncope, orthopnea, palpitations, paroxysmal nocturnal dyspnea and syncope.  Respiratory:  Negative for hemoptysis and shortness of breath.   Musculoskeletal:  Negative for muscle cramps and myalgias.  Gastrointestinal:  Negative for abdominal pain, constipation, diarrhea, hematemesis, hematochezia, melena, nausea and vomiting.  Neurological:  Negative for dizziness and light-headedness.   PHYSICAL EXAM: Vitals with BMI 10/09/2020 07/22/2020 06/02/2020  Height 5' 3" 5' 3" 5' 3"  Weight 152 lbs 151 lbs 150 lbs  BMI 26.93 82.99 37.16  Systolic 967 893 810  Diastolic 66 65 76  Pulse 60 63 76    CONSTITUTIONAL: Well-developed and well-nourished. No acute distress.  SKIN: Skin is warm and dry. No rash noted. No cyanosis. No pallor. No  jaundice HEAD: Normocephalic and atraumatic.  EYES: No scleral icterus MOUTH/THROAT: Moist oral membranes.  NECK: No JVD present. No thyromegaly noted. No carotid bruits  LYMPHATIC: No visible cervical adenopathy.  CHEST Normal respiratory effort. No intercostal retractions  LUNGS: Clear to auscultation bilaterally. No stridor. No wheezes. No rales.  CARDIOVASCULAR: Regular rate and rhythm, positive S1-S2, no murmurs rubs or gallops appreciated. ABDOMINAL: soft, nontender, nondistended, positive bowel sounds all 4 quadrants. No apparent ascites.  EXTREMITIES: No peripheral edema  HEMATOLOGIC: No significant bruising NEUROLOGIC: Oriented to person, place, and time. Nonfocal. Normal muscle tone.  PSYCHIATRIC: Normal mood and affect. Normal behavior. Cooperative  RADIOLOGY: MRI /MRA Head and Neck w/ w/o contrast: 11/19/2010: Moderate sized acute left hemisphere infarct affects the insular cortex, parietal and posterior temporal lobes, and underlying white matter.   Remote infarcts of the right parietal and left parietal lobes are observed.  There is also a small remote right posterior frontal infarct.  Remote cerebellar lacunar infarcts are visualized.  Brainstem is spared. Mild irregularity distal MCA and PCA branches consistent with intracranial atherosclerotic disease.  No proximal stenosis.   CARDIAC DATABASE: Mitral valve repair:  S/P Median sternotomy for mitral valve repair (  quadrangular resection of posterior leaflet, chordal transfer from posterior to anterior leaflet x 2, artificial chordal replacement x 3, #25 Carpentier-Edwards ring annuloplasty) by Dr. Darylene Price.    EKG: 07/22/2020: Atrial fibrillation, 72 bpm,, ST depressions in the lateral leads consider lateral ischemia, without underlying injury pattern.    Echocardiogram: 04/28/2020: 1. Left ventricular ejection fraction, by estimation, is 55 to 60%. The left ventricle has normal function. The left ventricle has no  regional wall motion abnormalities. Left ventricular diastolic function could not be evaluated. Elevated left atrial  pressure. The average left ventricular global longitudinal strain is -17.5 %. 2. Right ventricular systolic function is low normal. The right ventricular size is normal. There is normal pulmonary artery systolic pressure. The estimated right ventricular systolic pressure is 01.7 mmHg. 3. Annuloplasty ring present (Carpentier-Edwards) well seated, no evidence of dehiscence. No evidence of mitral valve regurgitation. Mild mitral stenosis (peak velocity 2.40ms, MG 584mg, MVA 1.95cm2, PHT 13674m, HR 73bpm). 4. The aortic valve is tricuspid. Aortic valve regurgitation is not visualized. No aortic stenosis is present. 5. The inferior vena cava is normal in size with greater than 50% respiratory variability, suggesting right atrial pressure of 3 mmHg. Comparison(s): Prior echo 11/20/2010: LVEF 60-65%, Trivial AR, normal functioning annular ring prosthesis.  Stress Testing: Exercise treadmill stress test 03/17/2020: Exercise treadmill stress test performed using Bruce protocol.  Patient reached 11 METS, and 111% of age predicted maximum heart rate.  Exercise capacity was excellent.  No chest pain reported.  Normal heart rate and hemodynamic response. Stress EKG revealed no ischemic changes, but showed frequent PVC's. PVC's were more frequent at rest, stress, and recovery. Consider clinical correlation and workup due to frequent PVC's.  Exercise Sestamibi Stress Test 06/09/2020: Equivocal ECG stress. The patient exercised for 6 minutes and 59 seconds of a Bruce protocol, achieving approximately 8.58 METs.  Resting EKG demonstrated normal sinus rhythm. Peak EKG demonstrated normal sinus rhythm. No ST-T wave abnormalities. During exercise the peak ECG revealed frequent premature ventricular contractions, couplets, bigeminy. No chest pain. Stress terminated due to THR achieved. There is a  reversible small to moderate sized mild defect in the inferior and apical regions. Overall LV systolic function is abnormal with inferior wall hypokinesis. Stress LV EF: 43%. No previous exam available for comparison.Intermediate risk study.  Heart Catheterization: May 2012: IMPRESSION: 1. Essentially normal coronary arteries. 2. Normal left ventricular function. 3. Dilated left atrium. 4. Severe mitral regurgitation with anterior posterior mitral valve prolapse. 5. Normal pulmonary pressure. 6. Normal cardiac indices. 7. Normal oxygen saturation.  14 day extended Holter monitor: Dominant rhythm normal sinus rhythm, followed by atrial fibrillation. Heart rate 45-142 bpm. Avg HR 72 bpm. No high grade AV block or pauses (3 seconds or longer). 2 episodes of nonsustained ventricular tachycardia, 4 beats in duration, fastest 158 bpm.  Atrial fibrillation burden approximately 1% (average heart rate 99 bpm, heart rate ranging between 60- 146 bpm). Longest duration of atrial fibrillation 45 minutes and 41 seconds. Total supraventricular ectopic burden <1%. Total ventricular ectopic burden of approximately 4.2% (episodes of isolated PVCs, and PVCs in bigeminy/trigeminy pattern) Patient triggered events: 1. Underlying rhythm normal sinus with rare ventricular ectopy.  LABORATORY DATA: External Labs:  Collected: 02/20/2020 provided by PCP  Creatinine 0.92 mg/dL. eGFR: 82 mL/min per 1.73 m Lipid profile: Total cholesterol 171, triglycerides 166, HDL 52, LDL 90, non-HDL 119 AST 27, ALT 21, alkaline phosphatase 65 Hemoglobin 14.3 g/dL, hematocrit 44.4%  IMPRESSION:    ICD-10-CM   1. Abnormal  nuclear stress test  R94.39 CBC    Basic metabolic panel    Magnesium    2. Paroxysmal atrial fibrillation (HCC)  I48.0     3. Monitoring for long-term anticoagulant use  Z51.81    Z79.01     4. H/O mitral valve repair  Z98.890     5. Premature ventricular complex  I49.3     6. Benign  hypertension  I10     7. History of stroke  Z86.73     8. Hypercholesterolemia  E78.00        RECOMMENDATIONS: Robert Lynn is a 70 y.o. male whose past medical history and cardiac risk factors include: Prostate cancer, dyslipidemia, Hx of melanoma, GERD, Hx of MRSA, HTN, history of CVA, Hx of MVP diagnosed in 1990 and subsequently underwent Mitral Valve Repair (2002), Long-term oral anticoagulation (since 2012 after CVA), advanced age.  Abnormal nuclear stress test No active chest pain at rest or with effort related activities. Has noticed worsening shortness of breath with overexertion which is new compared to baseline. Discussed continued GDMT and to monitor for worsening symptoms versus proceeding with left heart catheterization with possible intervention.  Shared decision was to proceed with left heart catheterization with possible intervention. The procedure of left heart catheterization with possible intervention was explained to the patient in detail.  The indication, alternatives, risks and benefits were reviewed.  Complications include but not limited to bleeding, infection, vascular injury, stroke, myocardial infection, arrhythmia, kidney injury requiring short-term or long-term hemodialysis, radiation-related injury in the case of prolonged fluoroscopy use, emergency cardiac surgery, and death. The patient understands the risks of serious complication is 1-2 in [id number] with diagnostic cardiac cath and 1-2% or less with angioplasty/stenting. The patient voices understanding and provides verbal feedback and wishes to proceed with coronary angiography with possible PCI. Check CBC and BMP  Paroxysmal atrial fibrillation (HCC) Rate control metoprolol. Rhythm control: N/A. Thromboembolic prophylaxis: Coumadin CHA2DS2-VASc SCORE is 4 which correlates to 4.8 % risk of stroke per year (hypertension, age, history of stroke)  Monitoring for long-term anticoagulant use Currently on  Coumadin. Does not endorse bleeding. Currently being monitored by home INR checks  H/O mitral valve repair History of mitral valve prolapse status post mitral valve repair 2002 Most recent echocardiogram notes normal functioning valve. Continue to monitor  Premature ventricular complex Asymptomatic. Continue Toprol-XL  Benign hypertension Follow-up blood pressures are very well controlled. Continue current medical therapy. Low-salt diet recommended.  Hypercholesterolemia Continue statin therapy. Currently managed by primary care provider.  FINAL MEDICATION LIST END OF ENCOUNTER: No orders of the defined types were placed in this encounter.   There are no discontinued medications.    Current Outpatient Medications:    amLODipine (NORVASC) 5 MG tablet, Take 5 mg by mouth daily., Disp: , Rfl:    beclomethasone (QVAR) 80 MCG/ACT inhaler, Inhale 1 puff into the lungs 2 (two) times daily., Disp: , Rfl:    fluticasone (FLONASE) 50 MCG/ACT nasal spray, Place 2 sprays into both nostrils daily., Disp: , Rfl:    hydrochlorothiazide (HYDRODIURIL) 25 MG tablet, Take 25 mg by mouth daily., Disp: , Rfl:    KLOR-CON M20 20 MEQ tablet, TAKE 1 TABLET BY MOUTH EVERY DAY FOR 90 DAYS, Disp: , Rfl:    metoprolol succinate (TOPROL XL) 25 MG 24 hr tablet, Take 1 tablet (25 mg total) by mouth daily., Disp: 90 tablet, Rfl: 3   omeprazole (PRILOSEC) 20 MG capsule, Take 20 mg by mouth daily., Disp: ,  Rfl:    rosuvastatin (CRESTOR) 5 MG tablet, Take 5 mg by mouth daily., Disp: , Rfl:    sildenafil (REVATIO) 20 MG tablet, Take 20 mg by mouth as needed., Disp: , Rfl:    warfarin (COUMADIN) 5 MG tablet, Take 5 mg by mouth daily., Disp: , Rfl:   Orders Placed This Encounter  Procedures   CBC   Basic metabolic panel   Magnesium    There are no Patient Instructions on file for this visit.   --Continue cardiac medications as reconciled in final medication list. --Return in about 4 weeks (around  11/06/2020) for Follow up, Post heart catheterization. Or sooner if needed. --Continue follow-up with your primary care physician regarding the management of your other chronic comorbid conditions.  Patient's questions and concerns were addressed to his satisfaction. He voices understanding of the instructions provided during this encounter.   This note was created using a voice recognition software as a result there may be grammatical errors inadvertently enclosed that do not reflect the nature of this encounter. Every attempt is made to correct such errors.   Rex Kras, [place], [facility]  Pager: [phone] Office: [phone]

## 2020-10-09 NOTE — H&P (View-Only) (Signed)
ID:  Robert Lynn, DOB 20-Apr-1950, MRN [id number]  PCP:  Leeroy Cha, MD  Cardiologist:  Rex Kras, DO, [facility] (established care 03/04/2020) Former Cardiology Providers: Dr. Golden Hurter Cardiothoracic Surgeon: Dr. Ricard Dillon  Date: 10/09/20 Last Office Visit: 07/22/2020  Chief Complaint  Patient presents with   Follow-up    History of abnormal nuclear stress test    HPI  Robert Lynn is a 70 y.o. male who presents to the office with a chief complaint of " history of abnormal nuclear stress test." Patient's past medical history and cardiovascular risk factors include: Prostate cancer, dyslipidemia, Hx of melanoma, GERD, Hx of MRSA, HTN, history of CVA, Hx of MVP diagnosed in 1990 and subsequently underwent Mitral Valve Repair (2002), Long-term oral anticoagulation (since 2012 after CVA), advanced age.  He is referred to the office at the request of Leeroy Cha,* for evaluation of history of mitral valve repair and currently on oral anticoagulation.  Patient has a history of mitral valve prolapse diagnosed in 1990 and underwent mitral valve repair with Dr. Ricard Dillon in 2002.  Then in 2012 presented to the ED with expressive aphasia and was diagnosed with a stroke.  MRI/MRA noted acute infarctions involving the left hemisphere affecting the insular cortex, parietal, and posterior temporal lobes and underlying white matter.  He also was noted to have remote infarcts in the right parietal and left parietal lobes per report.  He underwent extensive cardiovascular testing and at discharge was placed on Coumadin as he had 3 different infarcts involving more than 1 vascular territory please refer to the discharge summary available in epic dated 11/19/2020.  He was referred to me in January 2022 with a question of the need to continue oral anticoagulation long-term.  The shared decision was to proceed with a Holter monitor which noted presence of atrial fibrillation and given his  underlying mitral valve disease history decision was to continue Coumadin for thromboembolic prophylaxis.  He is currently enrolled in in-home monitoring for his INR Holter monitor patient was also noted to have NSVT and underwent exercise treadmill stress test.  He has excellent functional capacity for age but was noted to have significant ectopy during the study details noted below for the office.  Subsequently he underwent exercise nuclear stress test which notes reversible ischemia in the inferior and apical regions.  At the last office visit patient wanted to continue conservative management.  He now presents for follow-up.  Patient states that he denies chest pain at rest or with effort related activities.  He does experience shortness of breath which is more noticeable since last office visit.  Shortness of breath has been surfacing more often with overexertion.  He denies orthopnea, paroxysmal nocturnal dyspnea or lower extremity swelling.  Patient denies history of intracranial bleeding, gastrointestinal bleeding, does not endorsed evidence of bleeding and he is currently on oral anticoagulation.   FUNCTIONAL STATUS: Goes to gym regularly with resistance training and cardio. Used to do 5 K prior to COVID pandemic.    ALLERGIES: No Known Allergies  MEDICATION LIST PRIOR TO VISIT: Current Meds  Medication Sig   amLODipine (NORVASC) 5 MG tablet Take 5 mg by mouth daily.   beclomethasone (QVAR) 80 MCG/ACT inhaler Inhale 1 puff into the lungs 2 (two) times daily.   fluticasone (FLONASE) 50 MCG/ACT nasal spray Place 2 sprays into both nostrils daily.   hydrochlorothiazide (HYDRODIURIL) 25 MG tablet Take 25 mg by mouth daily.   KLOR-CON M20 20 MEQ tablet TAKE 1  TABLET BY MOUTH EVERY DAY FOR 90 DAYS   metoprolol succinate (TOPROL XL) 25 MG 24 hr tablet Take 1 tablet (25 mg total) by mouth daily.   omeprazole (PRILOSEC) 20 MG capsule Take 20 mg by mouth daily.   rosuvastatin (CRESTOR) 5 MG  tablet Take 5 mg by mouth daily.   sildenafil (REVATIO) 20 MG tablet Take 20 mg by mouth as needed.   warfarin (COUMADIN) 5 MG tablet Take 5 mg by mouth daily.     PAST MEDICAL HISTORY: Past Medical History:  Diagnosis Date   Allergy    Atrial fibrillation (HCC)    Heart murmur    Hyperlipidemia    Hypertension    Melanoma (HCC)    MVP (mitral valve prolapse)    Prostate cancer (HCC)    PVC (premature ventricular contraction)    Stroke (HCC)     PAST SURGICAL HISTORY: Past Surgical History:  Procedure Laterality Date   MITRAL VALVE REPAIR  2002    FAMILY HISTORY: The patient family history includes Dementia in his mother; Emphysema in his father; Snoring in his father. He was adopted.  SOCIAL HISTORY:  The patient  reports that he has never smoked. He has never used smokeless tobacco. He reports that he does not drink alcohol and does not use drugs.  REVIEW OF SYSTEMS: Review of Systems  Constitutional: Negative for chills and fever.  HENT:  Negative for hoarse voice and nosebleeds.   Eyes:  Negative for discharge, double vision and pain.  Cardiovascular:  Negative for chest pain, claudication, dyspnea on exertion, leg swelling, near-syncope, orthopnea, palpitations, paroxysmal nocturnal dyspnea and syncope.  Respiratory:  Negative for hemoptysis and shortness of breath.   Musculoskeletal:  Negative for muscle cramps and myalgias.  Gastrointestinal:  Negative for abdominal pain, constipation, diarrhea, hematemesis, hematochezia, melena, nausea and vomiting.  Neurological:  Negative for dizziness and light-headedness.   PHYSICAL EXAM: Vitals with BMI 10/09/2020 07/22/2020 06/02/2020  Height 5' 3" 5' 3" 5' 3"  Weight 152 lbs 151 lbs 150 lbs  BMI 26.93 26.76 26.58  Systolic 117 147 143  Diastolic 66 65 76  Pulse 60 63 76    CONSTITUTIONAL: Well-developed and well-nourished. No acute distress.  SKIN: Skin is warm and dry. No rash noted. No cyanosis. No pallor. No  jaundice HEAD: Normocephalic and atraumatic.  EYES: No scleral icterus MOUTH/THROAT: Moist oral membranes.  NECK: No JVD present. No thyromegaly noted. No carotid bruits  LYMPHATIC: No visible cervical adenopathy.  CHEST Normal respiratory effort. No intercostal retractions  LUNGS: Clear to auscultation bilaterally. No stridor. No wheezes. No rales.  CARDIOVASCULAR: Regular rate and rhythm, positive S1-S2, no murmurs rubs or gallops appreciated. ABDOMINAL: soft, nontender, nondistended, positive bowel sounds all 4 quadrants. No apparent ascites.  EXTREMITIES: No peripheral edema  HEMATOLOGIC: No significant bruising NEUROLOGIC: Oriented to person, place, and time. Nonfocal. Normal muscle tone.  PSYCHIATRIC: Normal mood and affect. Normal behavior. Cooperative  RADIOLOGY: MRI /MRA Head and Neck w/ w/o contrast: 11/19/2010: Moderate sized acute left hemisphere infarct affects the insular cortex, parietal and posterior temporal lobes, and underlying white matter.   Remote infarcts of the right parietal and left parietal lobes are observed.  There is also a small remote right posterior frontal infarct.  Remote cerebellar lacunar infarcts are visualized.  Brainstem is spared. Mild irregularity distal MCA and PCA branches consistent with intracranial atherosclerotic disease.  No proximal stenosis.   CARDIAC DATABASE: Mitral valve repair:  S/P Median sternotomy for mitral valve repair (  quadrangular resection of posterior leaflet, chordal transfer from posterior to anterior leaflet x 2, artificial chordal replacement x 3, #25 Carpentier-Edwards ring annuloplasty) by Dr. Clarence Owen.    EKG: 07/22/2020: Atrial fibrillation, 72 bpm,, ST depressions in the lateral leads consider lateral ischemia, without underlying injury pattern.    Echocardiogram: 04/28/2020: 1. Left ventricular ejection fraction, by estimation, is 55 to 60%. The left ventricle has normal function. The left ventricle has no  regional wall motion abnormalities. Left ventricular diastolic function could not be evaluated. Elevated left atrial  pressure. The average left ventricular global longitudinal strain is -17.5 %. 2. Right ventricular systolic function is low normal. The right ventricular size is normal. There is normal pulmonary artery systolic pressure. The estimated right ventricular systolic pressure is 25.5 mmHg. 3. Annuloplasty ring present (Carpentier-Edwards) well seated, no evidence of dehiscence. No evidence of mitral valve regurgitation. Mild mitral stenosis (peak velocity 2.1m/s, MG 5mmHg, MVA 1.95cm2, PHT 136msec, HR 73bpm). 4. The aortic valve is tricuspid. Aortic valve regurgitation is not visualized. No aortic stenosis is present. 5. The inferior vena cava is normal in size with greater than 50% respiratory variability, suggesting right atrial pressure of 3 mmHg. Comparison(s): Prior echo 11/20/2010: LVEF 60-65%, Trivial AR, normal functioning annular ring prosthesis.  Stress Testing: Exercise treadmill stress test 03/17/2020: Exercise treadmill stress test performed using Bruce protocol.  Patient reached 11 METS, and 111% of age predicted maximum heart rate.  Exercise capacity was excellent.  No chest pain reported.  Normal heart rate and hemodynamic response. Stress EKG revealed no ischemic changes, but showed frequent PVC's. PVC's were more frequent at rest, stress, and recovery. Consider clinical correlation and workup due to frequent PVC's.  Exercise Sestamibi Stress Test 06/09/2020: Equivocal ECG stress. The patient exercised for 6 minutes and 59 seconds of a Bruce protocol, achieving approximately 8.58 METs.  Resting EKG demonstrated normal sinus rhythm. Peak EKG demonstrated normal sinus rhythm. No ST-T wave abnormalities. During exercise the peak ECG revealed frequent premature ventricular contractions, couplets, bigeminy. No chest pain. Stress terminated due to THR achieved. There is a  reversible small to moderate sized mild defect in the inferior and apical regions. Overall LV systolic function is abnormal with inferior wall hypokinesis. Stress LV EF: 43%. No previous exam available for comparison.Intermediate risk study.  Heart Catheterization: May 2012: IMPRESSION: 1. Essentially normal coronary arteries. 2. Normal left ventricular function. 3. Dilated left atrium. 4. Severe mitral regurgitation with anterior posterior mitral valve prolapse. 5. Normal pulmonary pressure. 6. Normal cardiac indices. 7. Normal oxygen saturation.  14 day extended Holter monitor: Dominant rhythm normal sinus rhythm, followed by atrial fibrillation. Heart rate 45-142 bpm. Avg HR 72 bpm. No high grade AV block or pauses (3 seconds or longer). 2 episodes of nonsustained ventricular tachycardia, 4 beats in duration, fastest 158 bpm.  Atrial fibrillation burden approximately 1% (average heart rate 99 bpm, heart rate ranging between 60- 146 bpm). Longest duration of atrial fibrillation 45 minutes and 41 seconds. Total supraventricular ectopic burden <1%. Total ventricular ectopic burden of approximately 4.2% (episodes of isolated PVCs, and PVCs in bigeminy/trigeminy pattern) Patient triggered events: 1. Underlying rhythm normal sinus with rare ventricular ectopy.  LABORATORY DATA: External Labs:  Collected: 02/20/2020 provided by PCP  Creatinine 0.92 mg/dL. eGFR: 82 mL/min per 1.73 m Lipid profile: Total cholesterol 171, triglycerides 166, HDL 52, LDL 90, non-HDL 119 AST 27, ALT 21, alkaline phosphatase 65 Hemoglobin 14.3 g/dL, hematocrit 44.4%  IMPRESSION:    ICD-10-CM   1. Abnormal   nuclear stress test  R94.39 CBC    Basic metabolic panel    Magnesium    2. Paroxysmal atrial fibrillation (HCC)  I48.0     3. Monitoring for long-term anticoagulant use  Z51.81    Z79.01     4. H/O mitral valve repair  Z98.890     5. Premature ventricular complex  I49.3     6. Benign  hypertension  I10     7. History of stroke  Z86.73     8. Hypercholesterolemia  E78.00        RECOMMENDATIONS: Robert Lynn is a 69 y.o. male whose past medical history and cardiac risk factors include: Prostate cancer, dyslipidemia, Hx of melanoma, GERD, Hx of MRSA, HTN, history of CVA, Hx of MVP diagnosed in 1990 and subsequently underwent Mitral Valve Repair (2002), Long-term oral anticoagulation (since 2012 after CVA), advanced age.  Abnormal nuclear stress test No active chest pain at rest or with effort related activities. Has noticed worsening shortness of breath with overexertion which is new compared to baseline. Discussed continued GDMT and to monitor for worsening symptoms versus proceeding with left heart catheterization with possible intervention.  Shared decision was to proceed with left heart catheterization with possible intervention. The procedure of left heart catheterization with possible intervention was explained to the patient in detail.  The indication, alternatives, risks and benefits were reviewed.  Complications include but not limited to bleeding, infection, vascular injury, stroke, myocardial infection, arrhythmia, kidney injury requiring short-term or long-term hemodialysis, radiation-related injury in the case of prolonged fluoroscopy use, emergency cardiac surgery, and death. The patient understands the risks of serious complication is 1-2 in 1000 with diagnostic cardiac cath and 1-2% or less with angioplasty/stenting. The patient voices understanding and provides verbal feedback and wishes to proceed with coronary angiography with possible PCI. Check CBC and BMP  Paroxysmal atrial fibrillation (HCC) Rate control metoprolol. Rhythm control: N/A. Thromboembolic prophylaxis: Coumadin CHA2DS2-VASc SCORE is 4 which correlates to 4.8 % risk of stroke per year (hypertension, age, history of stroke)  Monitoring for long-term anticoagulant use Currently on  Coumadin. Does not endorse bleeding. Currently being monitored by home INR checks  H/O mitral valve repair History of mitral valve prolapse status post mitral valve repair 2002 Most recent echocardiogram notes normal functioning valve. Continue to monitor  Premature ventricular complex Asymptomatic. Continue Toprol-XL  Benign hypertension Follow-up blood pressures are very well controlled. Continue current medical therapy. Low-salt diet recommended.  Hypercholesterolemia Continue statin therapy. Currently managed by primary care provider.  FINAL MEDICATION LIST END OF ENCOUNTER: No orders of the defined types were placed in this encounter.   There are no discontinued medications.    Current Outpatient Medications:    amLODipine (NORVASC) 5 MG tablet, Take 5 mg by mouth daily., Disp: , Rfl:    beclomethasone (QVAR) 80 MCG/ACT inhaler, Inhale 1 puff into the lungs 2 (two) times daily., Disp: , Rfl:    fluticasone (FLONASE) 50 MCG/ACT nasal spray, Place 2 sprays into both nostrils daily., Disp: , Rfl:    hydrochlorothiazide (HYDRODIURIL) 25 MG tablet, Take 25 mg by mouth daily., Disp: , Rfl:    KLOR-CON M20 20 MEQ tablet, TAKE 1 TABLET BY MOUTH EVERY DAY FOR 90 DAYS, Disp: , Rfl:    metoprolol succinate (TOPROL XL) 25 MG 24 hr tablet, Take 1 tablet (25 mg total) by mouth daily., Disp: 90 tablet, Rfl: 3   omeprazole (PRILOSEC) 20 MG capsule, Take 20 mg by mouth daily., Disp: ,   Rfl:    rosuvastatin (CRESTOR) 5 MG tablet, Take 5 mg by mouth daily., Disp: , Rfl:    sildenafil (REVATIO) 20 MG tablet, Take 20 mg by mouth as needed., Disp: , Rfl:    warfarin (COUMADIN) 5 MG tablet, Take 5 mg by mouth daily., Disp: , Rfl:   Orders Placed This Encounter  Procedures   CBC   Basic metabolic panel   Magnesium    There are no Patient Instructions on file for this visit.   --Continue cardiac medications as reconciled in final medication list. --Return in about 4 weeks (around  11/06/2020) for Follow up, Post heart catheterization. Or sooner if needed. --Continue follow-up with your primary care physician regarding the management of your other chronic comorbid conditions.  Patient's questions and concerns were addressed to his satisfaction. He voices understanding of the instructions provided during this encounter.   This note was created using a voice recognition software as a result there may be grammatical errors inadvertently enclosed that do not reflect the nature of this encounter. Every attempt is made to correct such errors.   Oshea Percival, DO, FACC  Pager: [phone] Office: [phone]   

## 2020-10-14 ENCOUNTER — Ambulatory Visit: Payer: Self-pay | Admitting: Pharmacist

## 2020-10-14 DIAGNOSIS — Z5181 Encounter for therapeutic drug level monitoring: Secondary | ICD-10-CM

## 2020-10-14 DIAGNOSIS — Z7901 Long term (current) use of anticoagulants: Secondary | ICD-10-CM

## 2020-10-14 DIAGNOSIS — Z9889 Other specified postprocedural states: Secondary | ICD-10-CM

## 2020-10-14 DIAGNOSIS — I480 Paroxysmal atrial fibrillation: Secondary | ICD-10-CM

## 2020-10-14 LAB — POCT INR: INR: 2.4 (ref 2.0–3.0)

## 2020-10-14 NOTE — Progress Notes (Signed)
Anticoagulation Management Robert Lynn is a 70 y.o. male who reports to the clinic for monitoring of warfarin treatment.    Indication:  Paroxysmal Atrial Fibrillation, H/o mitral valve repair  CHA2DS2 Vasc Score 4 (Age 83-74, HTN hx, Hx of stroke), HAS-BLED 2 (Age>65, Stroke hx)  Duration: indefinite Supervising physician: Rex Kras  Anticoagulation Clinic Visit History:  Patient does not report signs/symptoms of bleeding or thromboembolism   Other recent changes: No change in diet, medications, lifestyle  Continues to do well without any noted changes.   Anticoagulation Episode Summary     Current INR goal:  2.0-3.0  TTR:  49.9 % (3.4 mo)  Next INR check:  10/20/2020  INR from last check:  2.4 (10/14/2020)  Weekly max warfarin dose:    Target end date:  Indefinite  INR check location:    Preferred lab:    Send INR reminders to:     Indications   H/O mitral valve repair [Z98.890] Monitoring for long-term anticoagulant use [Z51.81 Z79.01] Paroxysmal atrial fibrillation (HCC) [I48.0]        Comments:           No Known Allergies  Current Outpatient Medications:    amLODipine (NORVASC) 5 MG tablet, Take 5 mg by mouth daily., Disp: , Rfl:    beclomethasone (QVAR) 80 MCG/ACT inhaler, Inhale 1 puff into the lungs 2 (two) times daily., Disp: , Rfl:    fluticasone (FLONASE) 50 MCG/ACT nasal spray, Place 2 sprays into both nostrils daily., Disp: , Rfl:    hydrochlorothiazide (HYDRODIURIL) 25 MG tablet, Take 25 mg by mouth daily., Disp: , Rfl:    KLOR-CON M20 20 MEQ tablet, TAKE 1 TABLET BY MOUTH EVERY DAY FOR 90 DAYS, Disp: , Rfl:    metoprolol succinate (TOPROL XL) 25 MG 24 hr tablet, Take 1 tablet (25 mg total) by mouth daily., Disp: 90 tablet, Rfl: 3   omeprazole (PRILOSEC) 20 MG capsule, Take 20 mg by mouth daily., Disp: , Rfl:    rosuvastatin (CRESTOR) 5 MG tablet, Take 5 mg by mouth daily., Disp: , Rfl:    sildenafil (REVATIO) 20 MG tablet, Take 20 mg by mouth as  needed., Disp: , Rfl:    warfarin (COUMADIN) 5 MG tablet, Take 5 mg by mouth daily., Disp: , Rfl:  Past Medical History:  Diagnosis Date   Allergy    Atrial fibrillation (HCC)    Heart murmur    Hyperlipidemia    Hypertension    Melanoma ([place])    MVP (mitral valve prolapse)    Prostate cancer (HCC)    PVC (premature ventricular contraction)    Stroke ([place])     ASSESSMENT  Recent Results: The most recent result is correlated with 25 mg per week:  Lab Results  Component Value Date   INR 2.4 10/14/2020   INR 2.6 10/06/2020   INR 2.8 09/29/2020    Anticoagulation Dosing:   INR today: Therapeutic. Continues to remain therapeutic on current weekly dose Denies any complains of bleeding or bruising symptoms. Denies any other relevant changes in her diet, medications, or lifestyle. Will continue current weekly dose and continue close monitoring. Will continue current weekly dose and continue monitoring.   PLAN Weekly dose was unchanged by  0 % to 25 mg per week. Continue current weekly dose of 5 mg every Tues. Thurs, Sat and 2.5 mg all other days. Recheck INR in 1 weeks.   Patient Instructions  INR at goal. Continue current weekly dose of 5  mg Tue, Thu, Sat, 2.5 mg all other days. Recheck INR in 1 week. Patient advised to contact clinic or seek medical attention if signs/symptoms of bleeding or thromboembolism occur.  Patient verbalized understanding by repeating back information and was advised to contact me if further medication-related questions arise.   Follow-up Return in about 6 days (around 10/20/2020).  [name], PharmD  15 minutes spent face-to-face with the patient during the encounter. 50% of time spent on education, including signs/sx bleeding and clotting, as well as food and drug interactions with warfarin. 50% of time was spent on fingerprick POC INR sample collection,processing, results determination, and documentation

## 2020-10-14 NOTE — Patient Instructions (Signed)
INR at goal. Continue current weekly dose of 5 mg Tue, Thu, Sat, 2.5 mg all other days. Recheck INR in 1 week.

## 2020-10-20 ENCOUNTER — Ambulatory Visit: Payer: Self-pay | Admitting: Pharmacist

## 2020-10-20 DIAGNOSIS — Z5181 Encounter for therapeutic drug level monitoring: Secondary | ICD-10-CM

## 2020-10-20 DIAGNOSIS — Z9889 Other specified postprocedural states: Secondary | ICD-10-CM

## 2020-10-20 DIAGNOSIS — Z7901 Long term (current) use of anticoagulants: Secondary | ICD-10-CM | POA: Diagnosis not present

## 2020-10-20 DIAGNOSIS — I480 Paroxysmal atrial fibrillation: Secondary | ICD-10-CM

## 2020-10-20 LAB — POCT INR: INR: 2.1 (ref 2.0–3.0)

## 2020-10-20 NOTE — Progress Notes (Signed)
Anticoagulation Management Robert Lynn is a 70 y.o. male who reports to the clinic for monitoring of warfarin treatment.    Indication:  Paroxysmal Atrial Fibrillation, H/o mitral valve repair  CHA2DS2 Vasc Score 4 (Age 87-74, HTN hx, Hx of stroke), HAS-BLED 2 (Age>65, Stroke hx)  Duration: indefinite Supervising physician: Rex Kras  Anticoagulation Clinic Visit History:  Patient does not report signs/symptoms of bleeding or thromboembolism   Other recent changes: No change in diet, medications, lifestyle  Continues to do well without any noted changes. Pt reports that he has been eating high Vit K intake for 5-7 serving/week. Increase from previous baseline of 3 servings/week.  Pt is scheduled for left heart cath and coronary angiography on 10/28/20. Reviewed with Dr. Terri Skains. Will have pt hold warfarin for 5 days.   Anticoagulation Episode Summary     Current INR goal:  2.0-3.0  TTR:  52.7 % (3.6 mo)  Next INR check:  10/27/2020  INR from last check:  2.1 (10/20/2020)  Weekly max warfarin dose:    Target end date:  Indefinite  INR check location:    Preferred lab:    Send INR reminders to:     Indications   H/O mitral valve repair [Z98.890] Monitoring for long-term anticoagulant use [Z51.81 Z79.01] Paroxysmal atrial fibrillation (HCC) [I48.0]        Comments:           No Known Allergies  Current Outpatient Medications:    amLODipine (NORVASC) 5 MG tablet, Take 5 mg by mouth daily., Disp: , Rfl:    beclomethasone (QVAR) 80 MCG/ACT inhaler, Inhale 1 puff into the lungs 2 (two) times daily., Disp: , Rfl:    fluticasone (FLONASE) 50 MCG/ACT nasal spray, Place 2 sprays into both nostrils daily., Disp: , Rfl:    hydrochlorothiazide (HYDRODIURIL) 25 MG tablet, Take 25 mg by mouth daily., Disp: , Rfl:    KLOR-CON M20 20 MEQ tablet, TAKE 1 TABLET BY MOUTH EVERY DAY FOR 90 DAYS, Disp: , Rfl:    metoprolol succinate (TOPROL XL) 25 MG 24 hr tablet, Take 1 tablet (25 mg  total) by mouth daily., Disp: 90 tablet, Rfl: 3   omeprazole (PRILOSEC) 20 MG capsule, Take 20 mg by mouth daily., Disp: , Rfl:    rosuvastatin (CRESTOR) 5 MG tablet, Take 5 mg by mouth daily., Disp: , Rfl:    sildenafil (REVATIO) 20 MG tablet, Take 20 mg by mouth as needed., Disp: , Rfl:    warfarin (COUMADIN) 5 MG tablet, Take 5 mg by mouth daily. Or as directed by coumadin clinic  Currently on: 2.5 mg every Mon, Wed, Fri; 5 mg all other days, Disp: , Rfl:  Past Medical History:  Diagnosis Date   Allergy    Atrial fibrillation ([place])    Heart murmur    Hyperlipidemia    Hypertension    Melanoma ([place])    MVP (mitral valve prolapse)    Prostate cancer ([place])    PVC (premature ventricular contraction)    Stroke ([place])     ASSESSMENT  Recent Results: The most recent result is correlated with 25 mg per week:  Lab Results  Component Value Date   INR 2.1 10/20/2020   INR 2.4 10/14/2020   INR 2.6 10/06/2020    Anticoagulation Dosing:   INR today: Therapeutic. INR continues to trend down on current weekly dose. Denies any complains of bleeding or bruising symptoms. Denies any other relevant changes in her diet, medications, or lifestyle. Will increase  weekly dose to ensure INR remains closer to 2.5. Reviewed with pt holding warfarin.   PLAN Weekly dose was increased by  10 % to 27.5 mg per week. HOLD warfarin 9/15-9/19. Increase weekly dose to 2.5 mg every Mon, Wed, Fri and 5 mg all other days. Recheck INR in 1 weeks.   Patient Instructions  INR at goal. HOLD warfarin 9/15-9/19. Increase weekly dose to 2.5 mg every Mon, Wed, Fri and 5 mg all other days. Recheck INR in 1 week.  Patient advised to contact clinic or seek medical attention if signs/symptoms of bleeding or thromboembolism occur.  Patient verbalized understanding by repeating back information and was advised to contact me if further medication-related questions arise.   Follow-up Return in about 1 week (around  10/27/2020).  [name], PharmD  15 minutes spent face-to-face with the patient during the encounter. 50% of time spent on education, including signs/sx bleeding and clotting, as well as food and drug interactions with warfarin. 50% of time was spent on fingerprick POC INR sample collection,processing, results determination, and documentation

## 2020-10-20 NOTE — Patient Instructions (Addendum)
INR at goal. HOLD warfarin 9/15-9/19. Increase weekly dose to 2.5 mg every Mon, Wed, Fri and 5 mg all other days. Recheck INR in 1 week.

## 2020-10-24 DIAGNOSIS — R9439 Abnormal result of other cardiovascular function study: Secondary | ICD-10-CM | POA: Diagnosis not present

## 2020-10-25 LAB — BASIC METABOLIC PANEL
BUN/Creatinine Ratio: 23 (ref 10–24)
BUN: 18 mg/dL (ref 8–27)
CO2: 25 mmol/L (ref 20–29)
Calcium: 9.7 mg/dL (ref 8.6–10.2)
Chloride: 106 mmol/L (ref 96–106)
Creatinine, Ser: 0.8 mg/dL (ref 0.76–1.27)
Glucose: 115 mg/dL — ABNORMAL HIGH (ref 65–99)
Potassium: 5.1 mmol/L (ref 3.5–5.2)
Sodium: 145 mmol/L — ABNORMAL HIGH (ref 134–144)
eGFR: 96 mL/min/{1.73_m2} (ref >59)

## 2020-10-25 LAB — CBC
Hematocrit: 38.4 % (ref 37.5–51.0)
Hemoglobin: 12.5 g/dL — ABNORMAL LOW (ref 13.0–17.7)
MCH: 29.6 pg (ref 26.6–33.0)
MCHC: 32.6 g/dL (ref 31.5–35.7)
MCV: 91 fL (ref 79–97)
Platelets: 248 10*3/uL (ref 150–450)
RBC: 4.22 x10E6/uL (ref 4.14–5.80)
RDW: 14.2 % (ref 11.6–15.4)
WBC: 7.3 10*3/uL (ref 3.4–10.8)

## 2020-10-25 LAB — MAGNESIUM: Magnesium: 2 mg/dL (ref 1.6–2.3)

## 2020-10-27 ENCOUNTER — Ambulatory Visit: Payer: Self-pay | Admitting: Pharmacist

## 2020-10-27 DIAGNOSIS — Z7901 Long term (current) use of anticoagulants: Secondary | ICD-10-CM | POA: Diagnosis not present

## 2020-10-27 DIAGNOSIS — I480 Paroxysmal atrial fibrillation: Secondary | ICD-10-CM

## 2020-10-27 DIAGNOSIS — Z5181 Encounter for therapeutic drug level monitoring: Secondary | ICD-10-CM | POA: Diagnosis not present

## 2020-10-27 DIAGNOSIS — R0600 Dyspnea, unspecified: Secondary | ICD-10-CM | POA: Diagnosis present

## 2020-10-27 DIAGNOSIS — Z9889 Other specified postprocedural states: Secondary | ICD-10-CM

## 2020-10-27 DIAGNOSIS — R0609 Other forms of dyspnea: Secondary | ICD-10-CM | POA: Diagnosis present

## 2020-10-27 LAB — POCT INR: INR: 1.2 — AB (ref 2.0–3.0)

## 2020-10-27 NOTE — Progress Notes (Signed)
Anticoagulation Management Robert Lynn is a 70 y.o. male who reports to the clinic for monitoring of warfarin treatment.    Indication:  Paroxysmal Atrial Fibrillation, H/o mitral valve repair  CHA2DS2 Vasc Score 4 (Age 55-74, HTN hx, Hx of stroke), HAS-BLED 2 (Age>65, Stroke hx)  Duration: indefinite Supervising physician: Rex Kras  Anticoagulation Clinic Visit History:  Patient does not report signs/symptoms of bleeding or thromboembolism   Other recent changes: No change in diet, medications, lifestyle  Continues to do well without any noted changes. Pt reports that he has been eating high Vit K intake for 5-7 serving/week. Increase from previous baseline of 3 servings/week.  Pt is scheduled for left heart cath and coronary angiography on 10/28/20. Reviewed with Dr. Terri Skains. Will have pt hold warfarin for 5 days. Pt verified following hold direction as directed.   Anticoagulation Episode Summary     Current INR goal:  2.0-3.0  TTR:  50.2 % (3.9 mo)  Next INR check:  11/03/2020  INR from last check:  1.2 (10/27/2020)  Weekly max warfarin dose:    Target end date:  Indefinite  INR check location:    Preferred lab:    Send INR reminders to:     Indications   H/O mitral valve repair [Z98.890] Monitoring for long-term anticoagulant use [Z51.81 Z79.01] Paroxysmal atrial fibrillation (HCC) [I48.0]        Comments:           No Known Allergies  Current Outpatient Medications:    acetaminophen (TYLENOL) 500 MG tablet, Take 1,000 mg by mouth every 6 (six) hours as needed for moderate pain or headache., Disp: , Rfl:    amLODipine (NORVASC) 5 MG tablet, Take 5 mg by mouth daily., Disp: , Rfl:    beclomethasone (QVAR) 80 MCG/ACT inhaler, Inhale 1 puff into the lungs daily., Disp: , Rfl:    ECHINACEA PO, Take 2 tablets by mouth 4 (four) times daily as needed (cold symptoms)., Disp: , Rfl:    fluticasone (FLONASE) 50 MCG/ACT nasal spray, Place 1 spray into both nostrils  daily., Disp: , Rfl:    hydrochlorothiazide (HYDRODIURIL) 25 MG tablet, Take 25 mg by mouth daily., Disp: , Rfl:    KLOR-CON M20 20 MEQ tablet, Take 20 mEq by mouth daily., Disp: , Rfl:    loratadine (CLARITIN) 10 MG tablet, Take 10 mg by mouth daily as needed for allergies., Disp: , Rfl:    metoprolol succinate (TOPROL XL) 25 MG 24 hr tablet, Take 1 tablet (25 mg total) by mouth daily., Disp: 90 tablet, Rfl: 3   Multiple Vitamins-Minerals (ZINC PO), Take 1 tablet by mouth daily., Disp: , Rfl:    omeprazole (PRILOSEC) 20 MG capsule, Take 40 mg by mouth daily., Disp: , Rfl:    rosuvastatin (CRESTOR) 10 MG tablet, Take 10 mg by mouth daily., Disp: , Rfl:    sildenafil (VIAGRA) 100 MG tablet, Take 100 mg by mouth daily as needed for erectile dysfunction., Disp: , Rfl:    VITAMIN D PO, Take 1 capsule by mouth daily., Disp: , Rfl:    warfarin (COUMADIN) 5 MG tablet, Take 2.5-5 mg by mouth See admin instructions. Or as directed by coumadin clinic Take 5 mg on Sun, Tues,Thurs, Sat. Take 2.5 mg on Mon, Wed, and Fri, Disp: , Rfl:  Past Medical History:  Diagnosis Date   Allergy    Atrial fibrillation ([place])    Heart murmur    Hyperlipidemia    Hypertension    Melanoma (  [place])    MVP (mitral valve prolapse)    Prostate cancer (HCC)    PVC (premature ventricular contraction)    Stroke [facility])     ASSESSMENT  Recent Results: The most recent result is correlated with 25 mg per week:  Lab Results  Component Value Date   INR 1.2 (A) 10/27/2020   INR 2.1 10/20/2020   INR 2.4 10/14/2020    Anticoagulation Dosing:   INR today: Subtherapeutic. As expected in setting of held dose for upcoming right heart cath procedure tomorrow. INR <=1.5 and thus okay to proceed with the proccedure. Denies any complains of bleeding or bruising symptoms. Denies any other relevant changes in her diet, medications, or lifestyle. Pt agreeable to restarting previous weekly home warfarin dose with 24-48 hrs if cleared by  internationalist.   PLAN Weekly dose was unchanged by  0 % to 27.5 mg per week. HOLD warfarin 9/15-9/19. Restart weekly dose of 2.5 mg every Mon, Wed, Fri and 5 mg all other days. Recheck INR in 1 weeks.   Patient Instructions  INR at goal. HOLD warfarin 9/15-9/19. Restart previous home weekly dose of 2.5 mg every Mon, Wed, Fri and 5 mg all other days. Recheck INR in 1 week.  Patient advised to contact clinic or seek medical attention if signs/symptoms of bleeding or thromboembolism occur.  Patient verbalized understanding by repeating back information and was advised to contact me if further medication-related questions arise.   Follow-up Return in about 1 week (around 11/03/2020).  [name], PharmD  15 minutes spent face-to-face with the patient during the encounter. 50% of time spent on education, including signs/sx bleeding and clotting, as well as food and drug interactions with warfarin. 50% of time was spent on fingerprick POC INR sample collection,processing, results determination, and documentation

## 2020-10-27 NOTE — Patient Instructions (Signed)
INR at goal. HOLD warfarin 9/15-9/19. Restart previous home weekly dose of 2.5 mg every Mon, Wed, Fri and 5 mg all other days. Recheck INR in 1 week.

## 2020-10-28 ENCOUNTER — Ambulatory Visit (HOSPITAL_COMMUNITY)
Admission: RE | Admit: 2020-10-28 | Discharge: 2020-10-28 | Disposition: A | Discharge status: Home / Self Care | Payer: BC Managed Care – PPO | Attending: Cardiology | Admitting: Cardiology

## 2020-10-28 ENCOUNTER — Encounter (HOSPITAL_COMMUNITY)
Admission: RE | Disposition: A | Discharge status: Home / Self Care | Payer: Self-pay | Source: Home / Self Care | Attending: Cardiology

## 2020-10-28 DIAGNOSIS — R0609 Other forms of dyspnea: Secondary | ICD-10-CM | POA: Insufficient documentation

## 2020-10-28 DIAGNOSIS — Z7901 Long term (current) use of anticoagulants: Secondary | ICD-10-CM | POA: Diagnosis not present

## 2020-10-28 DIAGNOSIS — R9439 Abnormal result of other cardiovascular function study: Secondary | ICD-10-CM | POA: Diagnosis present

## 2020-10-28 DIAGNOSIS — I493 Ventricular premature depolarization: Secondary | ICD-10-CM | POA: Insufficient documentation

## 2020-10-28 DIAGNOSIS — E7800 Pure hypercholesterolemia, unspecified: Secondary | ICD-10-CM | POA: Diagnosis not present

## 2020-10-28 DIAGNOSIS — Z79899 Other long term (current) drug therapy: Secondary | ICD-10-CM | POA: Diagnosis not present

## 2020-10-28 DIAGNOSIS — I10 Essential (primary) hypertension: Secondary | ICD-10-CM | POA: Diagnosis not present

## 2020-10-28 DIAGNOSIS — I480 Paroxysmal atrial fibrillation: Secondary | ICD-10-CM | POA: Diagnosis not present

## 2020-10-28 DIAGNOSIS — I2510 Atherosclerotic heart disease of native coronary artery without angina pectoris: Secondary | ICD-10-CM | POA: Diagnosis not present

## 2020-10-28 DIAGNOSIS — Z8673 Personal history of transient ischemic attack (TIA), and cerebral infarction without residual deficits: Secondary | ICD-10-CM | POA: Insufficient documentation

## 2020-10-28 DIAGNOSIS — R0600 Dyspnea, unspecified: Secondary | ICD-10-CM | POA: Diagnosis present

## 2020-10-28 HISTORY — PX: RIGHT/LEFT HEART CATH AND CORONARY ANGIOGRAPHY: CATH118266

## 2020-10-28 LAB — PROTIME-INR
INR: 1.1 (ref 0.8–1.2)
Prothrombin Time: 14.7 seconds (ref 11.4–15.2)

## 2020-10-28 SURGERY — RIGHT/LEFT HEART CATH AND CORONARY ANGIOGRAPHY
Anesthesia: LOCAL

## 2020-10-28 MED ORDER — SODIUM CHLORIDE 0.9 % IV SOLN: 250.0000 mL | INTRAVENOUS | Status: DC | PRN

## 2020-10-28 MED ORDER — LIDOCAINE HCL (PF) 1 % IJ SOLN
INTRAMUSCULAR | Status: AC
  Filled 2020-10-28: qty 30

## 2020-10-28 MED ORDER — SODIUM CHLORIDE 0.9 % WEIGHT BASED INFUSION: 1.0000 mL/kg/h | INTRAVENOUS | Status: DC

## 2020-10-28 MED ORDER — SODIUM CHLORIDE 0.9% FLUSH: 3.0000 mL | Freq: Two times a day (BID) | INTRAVENOUS | Status: DC

## 2020-10-28 MED ORDER — HEPARIN (PORCINE) IN NACL 1000-0.9 UT/500ML-% IV SOLN
INTRAVENOUS | Status: AC
  Filled 2020-10-28: qty 1000

## 2020-10-28 MED ORDER — SODIUM CHLORIDE 0.9% FLUSH: 3.0000 mL | INTRAVENOUS | Status: DC | PRN

## 2020-10-28 MED ORDER — VERAPAMIL HCL 2.5 MG/ML IV SOLN
INTRAVENOUS | Status: DC | PRN
  Administered 2020-10-28: 10 mL via INTRA_ARTERIAL

## 2020-10-28 MED ORDER — SODIUM CHLORIDE 0.9 % WEIGHT BASED INFUSION
3.0000 mL/kg/h | INTRAVENOUS | Status: DC
  Administered 2020-10-28: 3 mL/kg/h via INTRAVENOUS

## 2020-10-28 MED ORDER — FENTANYL CITRATE (PF) 100 MCG/2ML IJ SOLN
INTRAMUSCULAR | Status: DC | PRN
  Administered 2020-10-28: 25 ug via INTRAVENOUS

## 2020-10-28 MED ORDER — ASPIRIN 81 MG PO CHEW
81.0000 mg | CHEWABLE_TABLET | ORAL | Status: AC
  Administered 2020-10-28: 81 mg via ORAL

## 2020-10-28 MED ORDER — HEPARIN SODIUM (PORCINE) 1000 UNIT/ML IJ SOLN
INTRAMUSCULAR | Status: DC | PRN
  Administered 2020-10-28: 3000 [IU] via INTRAVENOUS

## 2020-10-28 MED ORDER — VERAPAMIL HCL 2.5 MG/ML IV SOLN
INTRAVENOUS | Status: AC
  Filled 2020-10-28: qty 2

## 2020-10-28 MED ORDER — HEPARIN SODIUM (PORCINE) 1000 UNIT/ML IJ SOLN
INTRAMUSCULAR | Status: AC
  Filled 2020-10-28: qty 1

## 2020-10-28 MED ORDER — ACETAMINOPHEN 325 MG PO TABS: 650.0000 mg | ORAL_TABLET | ORAL | Status: DC | PRN

## 2020-10-28 MED ORDER — ONDANSETRON HCL 4 MG/2ML IJ SOLN: 4.0000 mg | Freq: Four times a day (QID) | INTRAMUSCULAR | Status: DC | PRN

## 2020-10-28 MED ORDER — ASPIRIN 81 MG PO CHEW
CHEWABLE_TABLET | ORAL | Status: AC
  Filled 2020-10-28: qty 1

## 2020-10-28 MED ORDER — MIDAZOLAM HCL 2 MG/2ML IJ SOLN
INTRAMUSCULAR | Status: AC
  Filled 2020-10-28: qty 2

## 2020-10-28 MED ORDER — FENTANYL CITRATE (PF) 100 MCG/2ML IJ SOLN
INTRAMUSCULAR | Status: AC
  Filled 2020-10-28: qty 2

## 2020-10-28 MED ORDER — MIDAZOLAM HCL 2 MG/2ML IJ SOLN
INTRAMUSCULAR | Status: DC | PRN
  Administered 2020-10-28: 1 mg via INTRAVENOUS

## 2020-10-28 MED ORDER — LIDOCAINE HCL (PF) 1 % IJ SOLN
INTRAMUSCULAR | Status: DC | PRN
  Administered 2020-10-28 (×2): 2 mL

## 2020-10-28 MED ORDER — HEPARIN (PORCINE) IN NACL 1000-0.9 UT/500ML-% IV SOLN
INTRAVENOUS | Status: DC | PRN
  Administered 2020-10-28 (×2): 500 mL

## 2020-10-28 MED ORDER — IOHEXOL 350 MG/ML SOLN
INTRAVENOUS | Status: DC | PRN
  Administered 2020-10-28: 60 mL

## 2020-10-28 SURGICAL SUPPLY — 14 items
CATH BALLN WEDGE 5F 110CM (CATHETERS) ×2 IMPLANT
CATH INFINITI 5 FR JL3.5 (CATHETERS) ×2 IMPLANT
CATH INFINITI 5FR AL1 (CATHETERS) ×2 IMPLANT
CATH LAUNCHER 5F EBU4.0 (CATHETERS) ×2 IMPLANT
CATH OPTITORQUE TIG 4.0 5F (CATHETERS) ×2 IMPLANT
DEVICE RAD COMP TR BAND LRG (VASCULAR PRODUCTS) ×2 IMPLANT
GLIDESHEATH SLEND A-KIT 6F 22G (SHEATH) ×2 IMPLANT
GUIDEWIRE INQWIRE 1.5J.035X260 (WIRE) ×1 IMPLANT
INQWIRE 1.5J .035X260CM (WIRE) ×2
KIT HEART LEFT (KITS) ×2 IMPLANT
PACK CARDIAC CATHETERIZATION (CUSTOM PROCEDURE TRAY) ×2 IMPLANT
SHEATH GLIDE SLENDER 4/5FR (SHEATH) ×2 IMPLANT
TRANSDUCER W/STOPCOCK (MISCELLANEOUS) ×2 IMPLANT
TUBING CIL FLEX 10 FLL-RA (TUBING) ×2 IMPLANT

## 2020-10-28 NOTE — Interval H&P Note (Signed)
History and Physical Interval Note:  10/28/2020 1:33 PM  Robert Lynn  has presented today for surgery, with the diagnosis of positive stress test.  The various methods of treatment have been discussed with the patient and family. After consideration of risks, benefits and other options for treatment, the patient has consented to  Procedure(s): LEFT HEART CATH AND CORONARY ANGIOGRAPHY (N/A), right heart catheterization. Coronary angioplasty as a surgical intervention.  The patient's history has been reviewed, patient examined, no change in status, stable for surgery.  I have reviewed the patient's chart and labs.  Questions were answered to the patient's satisfaction.    Cath Lab Visit (complete for each Cath Lab visit)  Clinical Evaluation Leading to the Procedure:   ACS: No.  Non-ACS:    Anginal Classification: CCS III  Anti-ischemic medical therapy: Maximal Therapy (2 or more classes of medications)  Non-Invasive Test Results: Intermediate-risk stress test findings: cardiac mortality 1-3%/year  Prior CABG: No previous CABG  Adrian Prows

## 2020-10-28 NOTE — Discharge Instructions (Signed)

## 2020-10-29 ENCOUNTER — Encounter (HOSPITAL_COMMUNITY): Payer: Self-pay | Admitting: Cardiology

## 2020-10-29 LAB — POCT I-STAT EG7
Acid-Base Excess: 3 mmol/L — ABNORMAL HIGH (ref 0.0–2.0)
Acid-Base Excess: 4 mmol/L — ABNORMAL HIGH (ref 0.0–2.0)
Bicarbonate: 28.7 mmol/L — ABNORMAL HIGH (ref 20.0–28.0)
Bicarbonate: 29.3 mmol/L — ABNORMAL HIGH (ref 20.0–28.0)
Calcium, Ion: 1.23 mmol/L (ref 1.15–1.40)
Calcium, Ion: 1.25 mmol/L (ref 1.15–1.40)
HCT: 39 % (ref 39.0–52.0)
HCT: 40 % (ref 39.0–52.0)
Hemoglobin: 13.3 g/dL (ref 13.0–17.0)
Hemoglobin: 13.6 g/dL (ref 13.0–17.0)
O2 Saturation: 71 %
O2 Saturation: 73 %
Potassium: 3.5 mmol/L (ref 3.5–5.1)
Potassium: 3.5 mmol/L (ref 3.5–5.1)
Sodium: 143 mmol/L (ref 135–145)
Sodium: 143 mmol/L (ref 135–145)
TCO2: 30 mmol/L (ref 22–32)
TCO2: 31 mmol/L (ref 22–32)
pCO2, Ven: 46.1 mmHg (ref 44.0–60.0)
pCO2, Ven: 46.4 mmHg (ref 44.0–60.0)
pH, Ven: 7.403 (ref 7.250–7.430)
pH, Ven: 7.408 (ref 7.250–7.430)
pO2, Ven: 37 mmHg (ref 32.0–45.0)
pO2, Ven: 38 mmHg (ref 32.0–45.0)

## 2020-10-29 LAB — POCT I-STAT 7, (LYTES, BLD GAS, ICA,H+H)
Acid-Base Excess: 5 mmol/L — ABNORMAL HIGH (ref 0.0–2.0)
Bicarbonate: 30.5 mmol/L — ABNORMAL HIGH (ref 20.0–28.0)
Calcium, Ion: 1.28 mmol/L (ref 1.15–1.40)
HCT: 39 % (ref 39.0–52.0)
Hemoglobin: 13.3 g/dL (ref 13.0–17.0)
O2 Saturation: 98 %
Potassium: 3.6 mmol/L (ref 3.5–5.1)
Sodium: 143 mmol/L (ref 135–145)
TCO2: 32 mmol/L (ref 22–32)
pCO2 arterial: 46.2 mmHg (ref 32.0–48.0)
pH, Arterial: 7.428 (ref 7.350–7.450)
pO2, Arterial: 97 mmHg (ref 83.0–108.0)

## 2020-11-03 ENCOUNTER — Ambulatory Visit: Payer: Self-pay | Admitting: Pharmacist

## 2020-11-03 DIAGNOSIS — Z7901 Long term (current) use of anticoagulants: Secondary | ICD-10-CM

## 2020-11-03 DIAGNOSIS — Z5181 Encounter for therapeutic drug level monitoring: Secondary | ICD-10-CM | POA: Diagnosis not present

## 2020-11-03 DIAGNOSIS — Z9889 Other specified postprocedural states: Secondary | ICD-10-CM

## 2020-11-03 DIAGNOSIS — I480 Paroxysmal atrial fibrillation: Secondary | ICD-10-CM

## 2020-11-03 LAB — POCT INR: INR: 1.7 — AB (ref 2.0–3.0)

## 2020-11-03 NOTE — Progress Notes (Signed)
Anticoagulation Management Robert Lynn is a 70 y.o. male who reports to the clinic for monitoring of warfarin treatment.    Indication:  Paroxysmal Atrial Fibrillation, H/o mitral valve repair  CHA2DS2 Vasc Score 4 (Age 42-74, HTN hx, Hx of stroke), HAS-BLED 2 (Age>65, Stroke hx)  Duration: indefinite Supervising physician: Rex Kras  Anticoagulation Clinic Visit History:  Patient does not report signs/symptoms of bleeding or thromboembolism   Other recent changes: No change in diet, medications, lifestyle  Continues to do well without any noted changes. Pt reports that he has been eating high Vit K intake for 5-7 serving/week. Increase from previous baseline of 3 servings/week.  S/p right/left heart cath and coronary angiography on 10/28/20. Pt recovering well without any noted post op complications. No significant CAD. 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   Anticoagulation Episode Summary     Current INR goal:  2.0-3.0  TTR:  47.3 % (4.1 mo)  Next INR check:  11/10/2020  INR from last check:  1.7 (11/03/2020)  Weekly max warfarin dose:    Target end date:  Indefinite  INR check location:    Preferred lab:    Send INR reminders to:     Indications   H/O mitral valve repair [Z98.890] Monitoring for long-term anticoagulant use [Z51.81 Z79.01] Paroxysmal atrial fibrillation (HCC) [I48.0]        Comments:           No Known Allergies  Current Outpatient Medications:    acetaminophen (TYLENOL) 500 MG tablet, Take 1,000 mg by mouth every 6 (six) hours as needed for moderate pain or headache., Disp: , Rfl:    amLODipine (NORVASC) 5 MG tablet, Take 5 mg by mouth daily., Disp: , Rfl:    beclomethasone (QVAR) 80 MCG/ACT inhaler, Inhale 1 puff into the lungs daily., Disp: , Rfl:    ECHINACEA PO, Take 2 tablets by mouth 4 (four) times daily as needed (cold symptoms)., Disp: , Rfl:    fluticasone (FLONASE) 50 MCG/ACT nasal spray, Place 1  spray into both nostrils daily., Disp: , Rfl:    hydrochlorothiazide (HYDRODIURIL) 25 MG tablet, Take 25 mg by mouth daily., Disp: , Rfl:    KLOR-CON M20 20 MEQ tablet, Take 20 mEq by mouth daily., Disp: , Rfl:    loratadine (CLARITIN) 10 MG tablet, Take 10 mg by mouth daily as needed for allergies., Disp: , Rfl:    metoprolol succinate (TOPROL XL) 25 MG 24 hr tablet, Take 1 tablet (25 mg total) by mouth daily., Disp: 90 tablet, Rfl: 3   Multiple Vitamins-Minerals (ZINC PO), Take 1 tablet by mouth daily., Disp: , Rfl:    omeprazole (PRILOSEC) 20 MG capsule, Take 40 mg by mouth daily., Disp: , Rfl:    rosuvastatin (CRESTOR) 10 MG tablet, Take 10 mg by mouth daily., Disp: , Rfl:    sildenafil (VIAGRA) 100 MG tablet, Take 100 mg by mouth daily as needed for erectile dysfunction., Disp: , Rfl:    VITAMIN D PO, Take 1 capsule by mouth daily., Disp: , Rfl:    warfarin (COUMADIN) 5 MG tablet, Take 2.5-5 mg by mouth See admin instructions. Or as directed by coumadin clinic Take 5 mg on Sun, Tues,Thurs, Sat. Take 2.5 mg on Mon, Wed, and Fri, Disp: , Rfl:  Past Medical History:  Diagnosis Date   Allergy    Atrial fibrillation ([place])    Heart murmur    Hyperlipidemia    Hypertension  Melanoma ([place])    MVP (mitral valve prolapse)    Prostate cancer (HCC)    PVC (premature ventricular contraction)    Stroke [facility])     ASSESSMENT  Recent Results: The most recent result is correlated with 25 mg per week:  Lab Results  Component Value Date   INR 1.7 (A) 11/03/2020   INR 1.1 10/28/2020   INR 1.2 (A) 10/27/2020    Anticoagulation Dosing:   INR today: Subtherapeutic. Trending up following restarting on previously stable and therapeutic weekly dose. Denies any complains of bleeding or bruising symptoms. Denies any other relevant changes in her diet, medications, or lifestyle. Will boost today and continue previous weekly dose. Will consider increasing weekly dose at next check if INR continues to  be below range.   PLAN Weekly dose was unchanged by  0 % to 27.5 mg per week. Take 5 mg today and then continue previous weekly dose of 2.5 mg every Mon, Wed, Fri and 5 mg all other days. Recheck INR in 1 weeks.   Patient Instructions  INR below goal. Take 5 mg today and then continue previous weekly dose of 2.5 mg every Mon, Wed, Fri and 5 mg all other days. Recheck INR in 1 week.  Patient advised to contact clinic or seek medical attention if signs/symptoms of bleeding or thromboembolism occur.  Patient verbalized understanding by repeating back information and was advised to contact me if further medication-related questions arise.   Follow-up Return in about 1 week (around 11/10/2020).  [name], PharmD  15 minutes spent face-to-face with the patient during the encounter. 50% of time spent on education, including signs/sx bleeding and clotting, as well as food and drug interactions with warfarin. 50% of time was spent on fingerprick POC INR sample collection,processing, results determination, and documentation

## 2020-11-04 NOTE — Patient Instructions (Signed)
INR below goal. Take 5 mg today and then continue previous weekly dose of 2.5 mg every Mon, Wed, Fri and 5 mg all other days. Recheck INR in 1 week.

## 2020-11-10 ENCOUNTER — Ambulatory Visit: Payer: Self-pay | Admitting: Pharmacist

## 2020-11-10 DIAGNOSIS — Z9889 Other specified postprocedural states: Secondary | ICD-10-CM

## 2020-11-10 DIAGNOSIS — Z5181 Encounter for therapeutic drug level monitoring: Secondary | ICD-10-CM

## 2020-11-10 DIAGNOSIS — I480 Paroxysmal atrial fibrillation: Secondary | ICD-10-CM

## 2020-11-10 DIAGNOSIS — Z7901 Long term (current) use of anticoagulants: Secondary | ICD-10-CM | POA: Diagnosis not present

## 2020-11-10 LAB — POCT INR: INR: 2.4 (ref 2.0–3.0)

## 2020-11-10 NOTE — Progress Notes (Signed)
Anticoagulation Management Robert Lynn is a 70 y.o. male who reports to the clinic for monitoring of warfarin treatment.    Indication:  Paroxysmal Atrial Fibrillation, H/o mitral valve repair  CHA2DS2 Vasc Score 4 (Age 31-74, HTN hx, Hx of stroke), HAS-BLED 2 (Age>65, Stroke hx)  Duration: indefinite Supervising physician: Rex Kras  Anticoagulation Clinic Visit History:  Patient does not report signs/symptoms of bleeding or thromboembolism   Other recent changes: No change in diet, medications, lifestyle  Continues to do well without any noted changes. Maintains Vit K intake of 3-4 serving/week.   S/p right/left heart cath and coronary angiography on 10/28/20. Pt recovering well without any noted post op complications. No significant CAD. 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   Anticoagulation Episode Summary     Current INR goal:  2.0-3.0  TTR:  47.9 % (4.3 mo)  Next INR check:  11/17/2020  INR from last check:  2.4 (11/10/2020)  Weekly max warfarin dose:    Target end date:  Indefinite  INR check location:    Preferred lab:    Send INR reminders to:     Indications   H/O mitral valve repair [Z98.890] Monitoring for long-term anticoagulant use [Z51.81 Z79.01] Paroxysmal atrial fibrillation (HCC) [I48.0]        Comments:           No Known Allergies  Current Outpatient Medications:    acetaminophen (TYLENOL) 500 MG tablet, Take 1,000 mg by mouth every 6 (six) hours as needed for moderate pain or headache., Disp: , Rfl:    amLODipine (NORVASC) 5 MG tablet, Take 5 mg by mouth daily., Disp: , Rfl:    beclomethasone (QVAR) 80 MCG/ACT inhaler, Inhale 1 puff into the lungs daily., Disp: , Rfl:    ECHINACEA PO, Take 2 tablets by mouth 4 (four) times daily as needed (cold symptoms)., Disp: , Rfl:    fluticasone (FLONASE) 50 MCG/ACT nasal spray, Place 1 spray into both nostrils daily., Disp: , Rfl:    hydrochlorothiazide (HYDRODIURIL)  25 MG tablet, Take 25 mg by mouth daily., Disp: , Rfl:    KLOR-CON M20 20 MEQ tablet, Take 20 mEq by mouth daily., Disp: , Rfl:    loratadine (CLARITIN) 10 MG tablet, Take 10 mg by mouth daily as needed for allergies., Disp: , Rfl:    metoprolol succinate (TOPROL XL) 25 MG 24 hr tablet, Take 1 tablet (25 mg total) by mouth daily., Disp: 90 tablet, Rfl: 3   Multiple Vitamins-Minerals (ZINC PO), Take 1 tablet by mouth daily., Disp: , Rfl:    omeprazole (PRILOSEC) 20 MG capsule, Take 40 mg by mouth daily., Disp: , Rfl:    rosuvastatin (CRESTOR) 10 MG tablet, Take 10 mg by mouth daily., Disp: , Rfl:    sildenafil (VIAGRA) 100 MG tablet, Take 100 mg by mouth daily as needed for erectile dysfunction., Disp: , Rfl:    VITAMIN D PO, Take 1 capsule by mouth daily., Disp: , Rfl:    warfarin (COUMADIN) 5 MG tablet, Take 2.5-5 mg by mouth See admin instructions. Or as directed by coumadin clinic Take 5 mg on Sun, Tues,Thurs, Sat. Take 2.5 mg on Mon, Wed, and Fri, Disp: , Rfl:  Past Medical History:  Diagnosis Date   Allergy    Atrial fibrillation ([place])    Heart murmur    Hyperlipidemia    Hypertension    Melanoma ([place])    MVP (mitral valve prolapse)  Prostate cancer ([place])    PVC (premature ventricular contraction)    Stroke [facility])     ASSESSMENT  Recent Results: The most recent result is correlated with 30 mg per week:  Lab Results  Component Value Date   INR 2.4 11/10/2020   INR 1.7 (A) 11/03/2020   INR 1.1 10/28/2020    Anticoagulation Dosing:   INR today: Therapeutic. INR continues to trend up following recent weekly dose and pt returning to previously stable weekly dose. Denies any complains of bleeding or bruising symptoms. Denies any other relevant changes in her diet, medications, or lifestyle. Will continue current weekly dose and continue close monitoring. Marland Kitchen   PLAN Weekly dose was unchanged by  0 % to 27.5 mg per week. Continue current weekly dose of 2.5 mg every Mon, Wed, Fri  and 5 mg all other days. Recheck INR in 1 weeks.   Patient Instructions  INR at goal. Continue current weekly dose of 2.5 mg every Mon, Wed, Fri and 5 mg all other days. Recheck INR in 1 week Patient advised to contact clinic or seek medical attention if signs/symptoms of bleeding or thromboembolism occur.  Patient verbalized understanding by repeating back information and was advised to contact me if further medication-related questions arise.   Follow-up Return in about 1 week (around 11/17/2020).  [name], PharmD  15 minutes spent face-to-face with the patient during the encounter. 50% of time spent on education, including signs/sx bleeding and clotting, as well as food and drug interactions with warfarin. 50% of time was spent on fingerprick POC INR sample collection,processing, results determination, and documentation

## 2020-11-10 NOTE — Patient Instructions (Signed)
INR at goal. Continue current weekly dose of 2.5 mg every Mon, Wed, Fri and 5 mg all other days. Recheck INR in 1 week

## 2020-11-17 ENCOUNTER — Ambulatory Visit: Payer: Self-pay | Admitting: Pharmacist

## 2020-11-17 DIAGNOSIS — I480 Paroxysmal atrial fibrillation: Secondary | ICD-10-CM

## 2020-11-17 DIAGNOSIS — Z7901 Long term (current) use of anticoagulants: Secondary | ICD-10-CM | POA: Diagnosis not present

## 2020-11-17 DIAGNOSIS — Z9889 Other specified postprocedural states: Secondary | ICD-10-CM

## 2020-11-17 DIAGNOSIS — Z5181 Encounter for therapeutic drug level monitoring: Secondary | ICD-10-CM

## 2020-11-17 LAB — POCT INR: INR: 2.4 (ref 2.0–3.0)

## 2020-11-17 NOTE — Progress Notes (Signed)
Anticoagulation Management Robert Lynn is a 70 y.o. male who reports to the clinic for monitoring of warfarin treatment.    Indication:  Paroxysmal Atrial Fibrillation, H/o mitral valve repair  CHA2DS2 Vasc Score 4 (Age 44-74, HTN hx, Hx of stroke), HAS-BLED 2 (Age>65, Stroke hx)  Duration: indefinite Supervising physician: Rex Kras  Anticoagulation Clinic Visit History:  Patient does not report signs/symptoms of bleeding or thromboembolism   Other recent changes: No change in diet, medications, lifestyle  Continues to do well without any noted changes. Maintains Vit K intake of 3-4 serving/week.   S/p right/left heart cath and coronary angiography on 10/28/20. Pt recovering well without any noted post op complications. No significant CAD. 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   Anticoagulation Episode Summary     Current INR goal:  2.0-3.0  TTR:  50.3 % (4.6 mo)  Next INR check:  11/24/2020  INR from last check:  2.4 (11/17/2020)  Weekly max warfarin dose:    Target end date:  Indefinite  INR check location:    Preferred lab:    Send INR reminders to:     Indications   H/O mitral valve repair [Z98.890] Monitoring for long-term anticoagulant use [Z51.81 Z79.01] Paroxysmal atrial fibrillation (HCC) [I48.0]        Comments:           No Known Allergies  Current Outpatient Medications:    acetaminophen (TYLENOL) 500 MG tablet, Take 1,000 mg by mouth every 6 (six) hours as needed for moderate pain or headache., Disp: , Rfl:    amLODipine (NORVASC) 5 MG tablet, Take 5 mg by mouth daily., Disp: , Rfl:    beclomethasone (QVAR) 80 MCG/ACT inhaler, Inhale 1 puff into the lungs daily., Disp: , Rfl:    ECHINACEA PO, Take 2 tablets by mouth 4 (four) times daily as needed (cold symptoms)., Disp: , Rfl:    fluticasone (FLONASE) 50 MCG/ACT nasal spray, Place 1 spray into both nostrils daily., Disp: , Rfl:    hydrochlorothiazide  (HYDRODIURIL) 25 MG tablet, Take 25 mg by mouth daily., Disp: , Rfl:    KLOR-CON M20 20 MEQ tablet, Take 20 mEq by mouth daily., Disp: , Rfl:    loratadine (CLARITIN) 10 MG tablet, Take 10 mg by mouth daily as needed for allergies., Disp: , Rfl:    metoprolol succinate (TOPROL XL) 25 MG 24 hr tablet, Take 1 tablet (25 mg total) by mouth daily., Disp: 90 tablet, Rfl: 3   Multiple Vitamins-Minerals (ZINC PO), Take 1 tablet by mouth daily., Disp: , Rfl:    omeprazole (PRILOSEC) 20 MG capsule, Take 40 mg by mouth daily., Disp: , Rfl:    rosuvastatin (CRESTOR) 10 MG tablet, Take 10 mg by mouth daily., Disp: , Rfl:    sildenafil (VIAGRA) 100 MG tablet, Take 100 mg by mouth daily as needed for erectile dysfunction., Disp: , Rfl:    VITAMIN D PO, Take 1 capsule by mouth daily., Disp: , Rfl:    warfarin (COUMADIN) 5 MG tablet, Take 2.5-5 mg by mouth See admin instructions. Or as directed by coumadin clinic Take 5 mg on Sun, Tues,Thurs, Sat. Take 2.5 mg on Mon, Wed, and Fri, Disp: , Rfl:  Past Medical History:  Diagnosis Date   Allergy    Atrial fibrillation ([place])    Heart murmur    Hyperlipidemia    Hypertension    Melanoma ([place])    MVP (mitral valve prolapse)  Prostate cancer ([place])    PVC (premature ventricular contraction)    Stroke [facility])     ASSESSMENT  Recent Results: The most recent result is correlated with 27.5 mg per week:  Lab Results  Component Value Date   INR 2.4 11/17/2020   INR 2.4 11/10/2020   INR 1.7 (A) 11/03/2020    Anticoagulation Dosing:   INR today: Therapeutic. INR continues to remain stable on current weekly dose. Denies any complains of bleeding or bruising symptoms. Denies any other relevant changes in her diet, medications, or lifestyle. Will continue current weekly dose and continue close monitoring. Marland Kitchen   PLAN Weekly dose was unchanged by  0 % to 27.5 mg per week. Continue current weekly dose of 2.5 mg every Mon, Wed, Fri and 5 mg all other days. Recheck  INR in 1 weeks.   Patient Instructions  INR at goal. Continue current weekly dose of 2.5 mg every Mon, Wed, Fri and 5 mg all other days. Recheck INR in 1 week.  Patient advised to contact clinic or seek medical attention if signs/symptoms of bleeding or thromboembolism occur.  Patient verbalized understanding by repeating back information and was advised to contact me if further medication-related questions arise.   Follow-up Return in about 9 days (around 11/26/2020).  [name], PharmD  15 minutes spent face-to-face with the patient during the encounter. 50% of time spent on education, including signs/sx bleeding and clotting, as well as food and drug interactions with warfarin. 50% of time was spent on fingerprick POC INR sample collection,processing, results determination, and documentation

## 2020-11-17 NOTE — Patient Instructions (Signed)
INR at goal. Continue current weekly dose of 2.5 mg every Mon, Wed, Fri and 5 mg all other days. Recheck INR in 1 week.

## 2020-11-24 ENCOUNTER — Ambulatory Visit: Payer: Self-pay | Admitting: Pharmacist

## 2020-11-24 DIAGNOSIS — I480 Paroxysmal atrial fibrillation: Secondary | ICD-10-CM | POA: Diagnosis not present

## 2020-11-24 DIAGNOSIS — Z5181 Encounter for therapeutic drug level monitoring: Secondary | ICD-10-CM

## 2020-11-24 DIAGNOSIS — Z7901 Long term (current) use of anticoagulants: Secondary | ICD-10-CM

## 2020-11-24 DIAGNOSIS — Z9889 Other specified postprocedural states: Secondary | ICD-10-CM

## 2020-11-24 LAB — POCT INR: INR: 2.3 (ref 2.0–3.0)

## 2020-11-24 NOTE — Patient Instructions (Signed)
INR at goal. Continue current weekly dose of 2.5 mg every Mon, Wed, Fri and 5 mg all other days. Recheck INR in 1 week.

## 2020-11-24 NOTE — Progress Notes (Signed)
Anticoagulation Management Robert Lynn is a 70 y.o. male who reports to the clinic for monitoring of warfarin treatment.    Indication:  Paroxysmal Atrial Fibrillation, H/o mitral valve repair  CHA2DS2 Vasc Score 4 (Age 9-74, HTN hx, Hx of stroke), HAS-BLED 2 (Age>65, Stroke hx)  Duration: indefinite Supervising physician: Rex Kras  Anticoagulation Clinic Visit History:  Patient does not report signs/symptoms of bleeding or thromboembolism   Other recent changes: No change in diet, medications, lifestyle  Continues to do well without any noted changes. Maintains Vit K intake of 3-4 serving/week.   S/p right/left heart cath and coronary angiography on 10/28/20. Pt recovering well without any noted post op complications. No significant CAD. 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   Anticoagulation Episode Summary     Current INR goal:  2.0-3.0  TTR:  52.7 % (4.8 mo)  Next INR check:  12/01/2020  INR from last check:  2.3 (11/24/2020)  Weekly max warfarin dose:    Target end date:  Indefinite  INR check location:    Preferred lab:    Send INR reminders to:     Indications   H/O mitral valve repair [Z98.890] Monitoring for long-term anticoagulant use [Z51.81 Z79.01] Paroxysmal atrial fibrillation (HCC) [I48.0]        Comments:           No Known Allergies  Current Outpatient Medications:    acetaminophen (TYLENOL) 500 MG tablet, Take 1,000 mg by mouth every 6 (six) hours as needed for moderate pain or headache., Disp: , Rfl:    amLODipine (NORVASC) 5 MG tablet, Take 5 mg by mouth daily., Disp: , Rfl:    beclomethasone (QVAR) 80 MCG/ACT inhaler, Inhale 1 puff into the lungs daily., Disp: , Rfl:    ECHINACEA PO, Take 2 tablets by mouth 4 (four) times daily as needed (cold symptoms)., Disp: , Rfl:    fluticasone (FLONASE) 50 MCG/ACT nasal spray, Place 1 spray into both nostrils daily., Disp: , Rfl:    hydrochlorothiazide  (HYDRODIURIL) 25 MG tablet, Take 25 mg by mouth daily., Disp: , Rfl:    KLOR-CON M20 20 MEQ tablet, Take 20 mEq by mouth daily., Disp: , Rfl:    loratadine (CLARITIN) 10 MG tablet, Take 10 mg by mouth daily as needed for allergies., Disp: , Rfl:    metoprolol succinate (TOPROL XL) 25 MG 24 hr tablet, Take 1 tablet (25 mg total) by mouth daily., Disp: 90 tablet, Rfl: 3   Multiple Vitamins-Minerals (ZINC PO), Take 1 tablet by mouth daily., Disp: , Rfl:    omeprazole (PRILOSEC) 20 MG capsule, Take 40 mg by mouth daily., Disp: , Rfl:    rosuvastatin (CRESTOR) 10 MG tablet, Take 10 mg by mouth daily., Disp: , Rfl:    sildenafil (VIAGRA) 100 MG tablet, Take 100 mg by mouth daily as needed for erectile dysfunction., Disp: , Rfl:    VITAMIN D PO, Take 1 capsule by mouth daily., Disp: , Rfl:    warfarin (COUMADIN) 5 MG tablet, Take 2.5-5 mg by mouth See admin instructions. Or as directed by coumadin clinic Take 5 mg on Sun, Tues,Thurs, Sat. Take 2.5 mg on Mon, Wed, and Fri, Disp: , Rfl:  Past Medical History:  Diagnosis Date   Allergy    Atrial fibrillation ([place])    Heart murmur    Hyperlipidemia    Hypertension    Melanoma ([place])    MVP (mitral valve prolapse)  Prostate cancer ([place])    PVC (premature ventricular contraction)    Stroke [facility])     ASSESSMENT  Recent Results: The most recent result is correlated with 27.5 mg per week:  Lab Results  Component Value Date   INR 2.3 11/24/2020   INR 2.4 11/17/2020   INR 2.4 11/10/2020    Anticoagulation Dosing:   INR today: Therapeutic. INR continues to remain stable on current weekly dose. Denies any complains of bleeding or bruising symptoms. Denies any other relevant changes in her diet, medications, or lifestyle. Will continue current weekly dose and continue close monitoring. Marland Kitchen   PLAN Weekly dose was unchanged by  0 % to 27.5 mg per week. Continue current weekly dose of 2.5 mg every Mon, Wed, Fri and 5 mg all other days. Recheck INR in  1 weeks.   Patient Instructions  INR at goal. Continue current weekly dose of 2.5 mg every Mon, Wed, Fri and 5 mg all other days. Recheck INR in 1 week.  Patient advised to contact clinic or seek medical attention if signs/symptoms of bleeding or thromboembolism occur.  Patient verbalized understanding by repeating back information and was advised to contact me if further medication-related questions arise.   Follow-up Return in about 1 week (around 12/01/2020).  [name], PharmD  15 minutes spent face-to-face with the patient during the encounter. 50% of time spent on education, including signs/sx bleeding and clotting, as well as food and drug interactions with warfarin. 50% of time was spent on fingerprick POC INR sample collection,processing, results determination, and documentation

## 2020-11-25 ENCOUNTER — Ambulatory Visit: Payer: BC Managed Care – PPO | Admitting: Cardiology

## 2020-12-01 ENCOUNTER — Ambulatory Visit: Payer: Self-pay | Admitting: Pharmacist

## 2020-12-01 DIAGNOSIS — Z7901 Long term (current) use of anticoagulants: Secondary | ICD-10-CM | POA: Diagnosis not present

## 2020-12-01 DIAGNOSIS — I480 Paroxysmal atrial fibrillation: Secondary | ICD-10-CM | POA: Diagnosis not present

## 2020-12-01 DIAGNOSIS — Z9889 Other specified postprocedural states: Secondary | ICD-10-CM | POA: Diagnosis not present

## 2020-12-01 DIAGNOSIS — Z5181 Encounter for therapeutic drug level monitoring: Secondary | ICD-10-CM | POA: Diagnosis not present

## 2020-12-01 LAB — POCT INR: INR: 2.4 (ref 2.0–3.0)

## 2020-12-01 NOTE — Progress Notes (Signed)
Anticoagulation Management Robert Lynn is a 70 y.o. male who reports to the clinic for monitoring of warfarin treatment.    Indication:  Paroxysmal Atrial Fibrillation, H/o mitral valve repair  CHA2DS2 Vasc Score 4 (Age 67-74, HTN hx, Hx of stroke), HAS-BLED 2 (Age>65, Stroke hx)  Duration: indefinite Supervising physician: Rex Kras  Anticoagulation Clinic Visit History:  Patient does not report signs/symptoms of bleeding or thromboembolism   Other recent changes: No change in diet, medications, lifestyle  Continues to do well without any noted changes. Maintains Vit K intake of 3-4 serving/week.   S/p right/left heart cath and coronary angiography on 10/28/20. Pt recovering well without any noted post op complications. No significant CAD. 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   Anticoagulation Episode Summary     Current INR goal:  2.0-3.0  TTR:  54.9 % (5 mo)  Next INR check:  12/08/2020  INR from last check:  2.4 (12/01/2020)  Weekly max warfarin dose:    Target end date:  Indefinite  INR check location:    Preferred lab:    Send INR reminders to:     Indications   H/O mitral valve repair [Z98.890] Monitoring for long-term anticoagulant use [Z51.81 Z79.01] Paroxysmal atrial fibrillation (HCC) [I48.0]        Comments:           No Known Allergies  Current Outpatient Medications:    acetaminophen (TYLENOL) 500 MG tablet, Take 1,000 mg by mouth every 6 (six) hours as needed for moderate pain or headache., Disp: , Rfl:    amLODipine (NORVASC) 5 MG tablet, Take 5 mg by mouth daily., Disp: , Rfl:    beclomethasone (QVAR) 80 MCG/ACT inhaler, Inhale 1 puff into the lungs daily., Disp: , Rfl:    ECHINACEA PO, Take 2 tablets by mouth 4 (four) times daily as needed (cold symptoms)., Disp: , Rfl:    fluticasone (FLONASE) 50 MCG/ACT nasal spray, Place 1 spray into both nostrils daily., Disp: , Rfl:    hydrochlorothiazide (HYDRODIURIL)  25 MG tablet, Take 25 mg by mouth daily., Disp: , Rfl:    KLOR-CON M20 20 MEQ tablet, Take 20 mEq by mouth daily., Disp: , Rfl:    loratadine (CLARITIN) 10 MG tablet, Take 10 mg by mouth daily as needed for allergies., Disp: , Rfl:    metoprolol succinate (TOPROL XL) 25 MG 24 hr tablet, Take 1 tablet (25 mg total) by mouth daily., Disp: 90 tablet, Rfl: 3   Multiple Vitamins-Minerals (ZINC PO), Take 1 tablet by mouth daily., Disp: , Rfl:    omeprazole (PRILOSEC) 20 MG capsule, Take 40 mg by mouth daily., Disp: , Rfl:    rosuvastatin (CRESTOR) 10 MG tablet, Take 10 mg by mouth daily., Disp: , Rfl:    sildenafil (VIAGRA) 100 MG tablet, Take 100 mg by mouth daily as needed for erectile dysfunction., Disp: , Rfl:    VITAMIN D PO, Take 1 capsule by mouth daily., Disp: , Rfl:    warfarin (COUMADIN) 5 MG tablet, Take 2.5-5 mg by mouth See admin instructions. Or as directed by coumadin clinic Take 5 mg on Sun, Tues,Thurs, Sat. Take 2.5 mg on Mon, Wed, and Fri, Disp: , Rfl:  Past Medical History:  Diagnosis Date   Allergy    Atrial fibrillation ([place])    Heart murmur    Hyperlipidemia    Hypertension    Melanoma ([place])    MVP (mitral valve prolapse)  Prostate cancer ([place])    PVC (premature ventricular contraction)    Stroke [facility])     ASSESSMENT  Recent Results: The most recent result is correlated with 27.5 mg per week:  Lab Results  Component Value Date   INR 2.4 12/01/2020   INR 2.3 11/24/2020   INR 2.4 11/17/2020    Anticoagulation Dosing:   INR today: Therapeutic. INR continues to remain stable on current weekly dose. Denies any complains of bleeding or bruising symptoms. Denies any other relevant changes in her diet, medications, or lifestyle. Will continue current weekly dose and continue close monitoring. Marland Kitchen   PLAN Weekly dose was unchanged by  0 % to 27.5 mg per week. Continue current weekly dose of 2.5 mg every Mon, Wed, Fri and 5 mg all other days. Recheck INR in 1 weeks.    Patient Instructions  INR at goal. Continue current weekly dose of 2.5 mg every Mon, Wed, Fri and 5 mg all other days. Recheck INR in 1 week.  Patient advised to contact clinic or seek medical attention if signs/symptoms of bleeding or thromboembolism occur.  Patient verbalized understanding by repeating back information and was advised to contact me if further medication-related questions arise.   Follow-up Return in about 1 week (around 12/08/2020).  Robert Lynn, PharmD  15 minutes spent face-to-face with the patient during the encounter. 50% of time spent on education, including signs/sx bleeding and clotting, as well as food and drug interactions with warfarin. 50% of time was spent on fingerprick POC INR sample collection,processing, results determination, and documentation

## 2020-12-01 NOTE — Patient Instructions (Signed)
INR at goal. Continue current weekly dose of 2.5 mg every Mon, Wed, Fri and 5 mg all other days. Recheck INR in 1 week.

## 2020-12-04 ENCOUNTER — Other Ambulatory Visit: Payer: Self-pay

## 2020-12-04 ENCOUNTER — Encounter: Payer: Self-pay | Admitting: Cardiology

## 2020-12-04 ENCOUNTER — Ambulatory Visit: Payer: BC Managed Care – PPO | Admitting: Cardiology

## 2020-12-04 VITALS — BP 141/80 | HR 57 | Temp 98.0°F | Resp 16 | Ht 63.0 in | Wt 154.0 lb

## 2020-12-04 DIAGNOSIS — I493 Ventricular premature depolarization: Secondary | ICD-10-CM | POA: Diagnosis not present

## 2020-12-04 DIAGNOSIS — Z7901 Long term (current) use of anticoagulants: Secondary | ICD-10-CM

## 2020-12-04 DIAGNOSIS — Z9889 Other specified postprocedural states: Secondary | ICD-10-CM | POA: Diagnosis not present

## 2020-12-04 DIAGNOSIS — I10 Essential (primary) hypertension: Secondary | ICD-10-CM

## 2020-12-04 DIAGNOSIS — E7800 Pure hypercholesterolemia, unspecified: Secondary | ICD-10-CM

## 2020-12-04 DIAGNOSIS — I480 Paroxysmal atrial fibrillation: Secondary | ICD-10-CM

## 2020-12-04 DIAGNOSIS — Z8673 Personal history of transient ischemic attack (TIA), and cerebral infarction without residual deficits: Secondary | ICD-10-CM

## 2020-12-04 NOTE — Progress Notes (Signed)
ID:  Robert Lynn, DOB 23-Jun-1950, MRN [id number]  PCP:  Leeroy Cha, MD  Cardiologist:  Rex Kras, DO, [facility] (established care 03/04/2020) Former Cardiology Providers: Dr. Golden Hurter Cardiothoracic Surgeon: Dr. Ricard Dillon  Date: 12/04/20 Last Office Visit: 10/09/2020  Chief Complaint  Patient presents with   Follow-up    Post cath    HPI  Robert Lynn is a 70 y.o. male who presents to the office with a chief complaint of " post left heart catheterization." Patient's past medical history and cardiovascular risk factors include: Prostate cancer, dyslipidemia, Hx of melanoma, GERD, Hx of MRSA, HTN, history of CVA, Hx of MVP diagnosed in 1990 and subsequently underwent Mitral Valve Repair (2002), Long-term oral anticoagulation (since 2012 after CVA), advanced age.  He is referred to the office at the request of Leeroy Cha,* for evaluation of history of mitral valve repair and currently on oral anticoagulation.  History of mitral valve prolapse diagnosed in 1990 and underwent mitral valve repair with Dr. Ricard Dillon in 2002.  Then in 2012 presented to the ED with expressive aphasia and was diagnosed with a stroke.  MRI/MRA noted acute infarctions involving the left hemisphere affecting the insular cortex, parietal, and posterior temporal lobes and underlying white matter.  He also was noted to have remote infarcts in the right parietal and left parietal lobes per report.  He underwent extensive cardiovascular testing and at discharge was placed on Coumadin as he had 3 different infarcts involving more than 1 vascular territory please refer to the discharge summary available in epic dated 11/19/2020.  Referred to me in January 2022 with a question for long-term oral anticoagulation.  The shared decision was to proceed with an extended Holter monitor which noted presence of atrial fibrillation.  And given his history of mitral valve repair Coumadin oral anticoagulation of  choice.  He has been transitioned to home monitoring and has been following up with the Coumadin clinic at the office.  At last office visit we discussed the results of the stress test which noted reversible ischemia in the inferior and apical regions and with the addition of NSVT during exercise the shared decision was to proceed with left heart catheterization to evaluate for obstructive CAD.  Since last visit patient has undergone left heart catheterization and was found to have very minimal CAD.  Results reviewed with him in great detail and noted below for further reference.  Clinically patient just ran his 5K marathon last week without any symptoms of chest pain.  Overall functional capacity remains stable.  No hospitalizations or urgent care visits for cardiovascular symptoms.  Patient does not endorse any evidence of bleeding.  And no prior history of intracranial bleeding or gastrointestinal bleeding.  Reemphasized the risks, benefits, and alternatives to oral anticoagulation as well.   FUNCTIONAL STATUS: Goes to gym regularly with resistance training and cardio. Used to do 5 K prior to COVID pandemic.    ALLERGIES: No Known Allergies  MEDICATION LIST PRIOR TO VISIT: Current Meds  Medication Sig   acetaminophen (TYLENOL) 500 MG tablet Take 1,000 mg by mouth every 6 (six) hours as needed for moderate pain or headache.   amLODipine (NORVASC) 5 MG tablet Take 5 mg by mouth daily.   beclomethasone (QVAR) 80 MCG/ACT inhaler Inhale 1 puff into the lungs daily.   ECHINACEA PO Take 2 tablets by mouth 4 (four) times daily as needed (cold symptoms).   fluticasone (FLONASE) 50 MCG/ACT nasal spray Place 1 spray into both nostrils  daily.   hydrochlorothiazide (HYDRODIURIL) 25 MG tablet Take 25 mg by mouth daily.   KLOR-CON M20 20 MEQ tablet Take 20 mEq by mouth daily.   loratadine (CLARITIN) 10 MG tablet Take 10 mg by mouth daily as needed for allergies.   metoprolol succinate (TOPROL XL) 25  MG 24 hr tablet Take 1 tablet (25 mg total) by mouth daily.   Multiple Vitamins-Minerals (ZINC PO) Take 1 tablet by mouth daily.   omeprazole (PRILOSEC) 20 MG capsule Take 40 mg by mouth daily.   rosuvastatin (CRESTOR) 10 MG tablet Take 10 mg by mouth daily.   sildenafil (VIAGRA) 100 MG tablet Take 100 mg by mouth daily as needed for erectile dysfunction.   VITAMIN D PO Take 1 capsule by mouth daily.   warfarin (COUMADIN) 5 MG tablet Take 2.5-5 mg by mouth See admin instructions. Or as directed by coumadin clinic Take 5 mg on Sun, Tues,Thurs, Sat. Take 2.5 mg on Mon, Wed, and Fri     PAST MEDICAL HISTORY: Past Medical History:  Diagnosis Date   Allergy    Atrial fibrillation ([place])    Heart murmur    Hyperlipidemia    Hypertension    Melanoma ([place])    MVP (mitral valve prolapse)    Prostate cancer ([place])    PVC (premature ventricular contraction)    Stroke ([place])     PAST SURGICAL HISTORY: Past Surgical History:  Procedure Laterality Date   MITRAL VALVE REPAIR  2002   RIGHT/LEFT HEART CATH AND CORONARY ANGIOGRAPHY N/A 10/28/2020   Procedure: RIGHT/LEFT HEART CATH AND CORONARY ANGIOGRAPHY;  Surgeon: Adrian Prows, MD;  Location: [facility];  Service: Cardiovascular;  Laterality: N/A;    FAMILY HISTORY: The patient family history includes Dementia in his mother; Emphysema in his father; Snoring in his father. He was adopted.  SOCIAL HISTORY:  The patient  reports that he has never smoked. He has never used smokeless tobacco. He reports that he does not drink alcohol and does not use drugs.  REVIEW OF SYSTEMS: Review of Systems  Constitutional: Negative for chills and fever.  HENT:  Negative for hoarse voice and nosebleeds.   Eyes:  Negative for discharge, double vision and pain.  Cardiovascular:  Negative for chest pain, claudication, dyspnea on exertion, leg swelling, near-syncope, orthopnea, palpitations, paroxysmal nocturnal dyspnea and syncope.  Respiratory:  Negative  for hemoptysis and shortness of breath.   Musculoskeletal:  Negative for muscle cramps and myalgias.  Gastrointestinal:  Negative for abdominal pain, constipation, diarrhea, hematemesis, hematochezia, melena, nausea and vomiting.  Neurological:  Negative for dizziness and light-headedness.   PHYSICAL EXAM: Vitals with BMI 12/04/2020 10/28/2020 10/28/2020  Height 5' 3" - -  Weight 154 lbs - -  BMI 97.67 - -  Systolic [phone]  Diastolic 80 68 71  Pulse 57 51 58    CONSTITUTIONAL: Well-developed and well-nourished. No acute distress.  SKIN: Skin is warm and dry. No rash noted. No cyanosis. No pallor. No jaundice HEAD: Normocephalic and atraumatic.  EYES: No scleral icterus MOUTH/THROAT: Moist oral membranes.  NECK: No JVD present. No thyromegaly noted. No carotid bruits  LYMPHATIC: No visible cervical adenopathy.  CHEST Normal respiratory effort. No intercostal retractions  LUNGS: Clear to auscultation bilaterally. No stridor. No wheezes. No rales.  CARDIOVASCULAR: Regular rate and rhythm, positive S1-S2, no murmurs rubs or gallops appreciated. ABDOMINAL: soft, nontender, nondistended, positive bowel sounds all 4 quadrants. No apparent ascites.  EXTREMITIES: No peripheral edema  HEMATOLOGIC: No significant  bruising NEUROLOGIC: Oriented to person, place, and time. Nonfocal. Normal muscle tone.  PSYCHIATRIC: Normal mood and affect. Normal behavior. Cooperative  RADIOLOGY: MRI /MRA Head and Neck w/ w/o contrast: 11/19/2010: Moderate sized acute left hemisphere infarct affects the insular cortex, parietal and posterior temporal lobes, and underlying white matter.   Remote infarcts of the right parietal and left parietal lobes are observed.  There is also a small remote right posterior frontal infarct.  Remote cerebellar lacunar infarcts are visualized.  Brainstem is spared. Mild irregularity distal MCA and PCA branches consistent with intracranial atherosclerotic disease.  No proximal  stenosis.   CARDIAC DATABASE: Mitral valve repair:  S/P Median sternotomy for mitral valve repair (quadrangular resection of posterior leaflet, chordal transfer from posterior to anterior leaflet x 2, artificial chordal replacement x 3, #25 Carpentier-Edwards ring annuloplasty) by Dr. Darylene Price.    EKG: 07/22/2020: Atrial fibrillation, 72 bpm,, ST depressions in the lateral leads consider lateral ischemia, without underlying injury pattern.    Echocardiogram: 04/28/2020: 1. Left ventricular ejection fraction, by estimation, is 55 to 60%. The left ventricle has normal function. The left ventricle has no regional wall motion abnormalities. Left ventricular diastolic function could not be evaluated. Elevated left atrial  pressure. The average left ventricular global longitudinal strain is -17.5 %. 2. Right ventricular systolic function is low normal. The right ventricular size is normal. There is normal pulmonary artery systolic pressure. The estimated right ventricular systolic pressure is 80.8 mmHg. 3. Annuloplasty ring present (Carpentier-Edwards) well seated, no evidence of dehiscence. No evidence of mitral valve regurgitation. Mild mitral stenosis (peak velocity 2.72ms, MG 564mg, MVA 1.95cm2, PHT 13654m, HR 73bpm). 4. The aortic valve is tricuspid. Aortic valve regurgitation is not visualized. No aortic stenosis is present. 5. The inferior vena cava is normal in size with greater than 50% respiratory variability, suggesting right atrial pressure of 3 mmHg. Comparison(s): Prior echo 11/20/2010: LVEF 60-65%, Trivial AR, normal functioning annular ring prosthesis.  Stress Testing: Exercise treadmill stress test 03/17/2020: Exercise treadmill stress test performed using Bruce protocol.  Patient reached 11 METS, and 111% of age predicted maximum heart rate.  Exercise capacity was excellent.  No chest pain reported.  Normal heart rate and hemodynamic response. Stress EKG revealed no ischemic  changes, but showed frequent PVC's. PVC's were more frequent at rest, stress, and recovery. Consider clinical correlation and workup due to frequent PVC's.  Exercise Sestamibi Stress Test 06/09/2020: Equivocal ECG stress. The patient exercised for 6 minutes and 59 seconds of a Bruce protocol, achieving approximately 8.58 METs.  Resting EKG demonstrated normal sinus rhythm. Peak EKG demonstrated normal sinus rhythm. No ST-T wave abnormalities. During exercise the peak ECG revealed frequent premature ventricular contractions, couplets, bigeminy. No chest pain. Stress terminated due to THR achieved. There is a reversible small to moderate sized mild defect in the inferior and apical regions. Overall LV systolic function is abnormal with inferior wall hypokinesis. Stress LV EF: 43%. No previous exam available for comparison.Intermediate risk study.  Heart Catheterization: Right and left heart catheterization 10/28/2020: RA 9/9, mean 7; RV 31/1, EDP 8 mmHg PA 36/13, mean 22 mmHg, PA saturation 72%. PW 21/23, mean 17 mmHg.  Aortic saturation 98%. QP/QS 1.0. CO 5.12, CI 3.01.  PVR 1.66 Wood units.   LV: 157/10, EDP 19 mmHg.  Ao 152/69, mean 96 mmHg.  No pressure gradient across the aortic valve. RCA: Dominant vessel.  Mild diffuse disease.  It continues as a moderate-sized PDA and PL branch. Left main: Large-caliber vessel.  Distal left main has a 20% stenosis. Circumflex: Large caliber circumflex, very small OM1 and OM 2 and continues as a large OM 3.  Mild 20 to 30% luminal irregularity in the proximal and mid segment. LAD: Moderate-sized vessel, diffuse disease.  Gives origin to 3 small diagonals in the proximal and mid segment, 20 to 30% luminal irregularity in the proximal and mid LAD.  Impression: No significant coronary artery disease.  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  14 day extended Holter monitor: Dominant rhythm normal sinus rhythm, followed by  atrial fibrillation. Heart rate 45-142 bpm. Avg HR 72 bpm. No high grade AV block or pauses (3 seconds or longer). 2 episodes of nonsustained ventricular tachycardia, 4 beats in duration, fastest 158 bpm.  Atrial fibrillation burden approximately 1% (average heart rate 99 bpm, heart rate ranging between 60- 146 bpm). Longest duration of atrial fibrillation 45 minutes and 41 seconds. Total supraventricular ectopic burden <1%. Total ventricular ectopic burden of approximately 4.2% (episodes of isolated PVCs, and PVCs in bigeminy/trigeminy pattern) Patient triggered events: 1. Underlying rhythm normal sinus with rare ventricular ectopy.  LABORATORY DATA: External Labs:  Collected: 02/20/2020 provided by PCP  Creatinine 0.92 mg/dL. eGFR: 82 mL/min per 1.73 m Lipid profile: Total cholesterol 171, triglycerides 166, HDL 52, LDL 90, non-HDL 119 AST 27, ALT 21, alkaline phosphatase 65 Hemoglobin 14.3 g/dL, hematocrit 44.4%  IMPRESSION:    ICD-10-CM   1. Paroxysmal atrial fibrillation (HCC)  I48.0     2. Long term (current) use of anticoagulants  Z79.01     3. H/O mitral valve repair  Z98.890     4. Premature ventricular complex  I49.3     5. Benign hypertension  I10     6. History of stroke  Z86.73     7. Hypercholesterolemia  E78.00        RECOMMENDATIONS: EUGUNE SINE is a 70 y.o. male whose past medical history and cardiac risk factors include: Prostate cancer, dyslipidemia, Hx of melanoma, GERD, Hx of MRSA, HTN, history of CVA, Hx of MVP diagnosed in 1990 and subsequently underwent Mitral Valve Repair (2002), Long-term oral anticoagulation (since 2012 after CVA), advanced age.  Given the recent nuclear stress test findings, symptoms of worsening shortness of breath with over exertional activities,  shared decision during last office visit was to proceed with left heart catheterization to evaluate for obstructive CAD.  Results of the left heart catheterization were reviewed  with him in great detail at today's visit.  No coronary interventions were necessary.  Educated on the importance of improving his modifiable cardiovascular risk factors with close follow-up.  Paroxysmal atrial fibrillation (HCC) Rate control: Metoprolol. Rhythm control: N/A. Thromboembolic prophylaxis: Coumadin CHA2DS2-VASc SCORE is 4 which correlates to 4.8% risk of stroke per year (HTN, age, history of stroke).  Long term (current) use of anticoagulants Currently on Coumadin for paroxysmal atrial fibrillation. Does not endorse any evidence of bleeding. Currently enrolled into home INR monitoring with our clinic. Reemphasized the risks, benefits, and alternatives to oral anticoagulation.  Patient verbalizes understanding.  H/O mitral valve repair History of mitral valve prolapse status postrepair in 2002 Most recent echocardiogram findings reviewed. Patient is aware of antibiotic prophylaxis prior to dental procedures.  Premature ventricular complex Now asymptomatic. Continue Toprol-XL  Benign hypertension Home blood pressures are very well controlled. Office blood pressures are within acceptable range. Reemphasized the importance of low-salt diet. Medications reconciled.  Hypercholesterolemia Currently on Crestor.   He  denies myalgia or other side effects. Currently managed by primary care provider.  FINAL MEDICATION LIST END OF ENCOUNTER: No orders of the defined types were placed in this encounter.   There are no discontinued medications.    Current Outpatient Medications:    acetaminophen (TYLENOL) 500 MG tablet, Take 1,000 mg by mouth every 6 (six) hours as needed for moderate pain or headache., Disp: , Rfl:    amLODipine (NORVASC) 5 MG tablet, Take 5 mg by mouth daily., Disp: , Rfl:    beclomethasone (QVAR) 80 MCG/ACT inhaler, Inhale 1 puff into the lungs daily., Disp: , Rfl:    ECHINACEA PO, Take 2 tablets by mouth 4 (four) times daily as needed (cold symptoms).,  Disp: , Rfl:    fluticasone (FLONASE) 50 MCG/ACT nasal spray, Place 1 spray into both nostrils daily., Disp: , Rfl:    hydrochlorothiazide (HYDRODIURIL) 25 MG tablet, Take 25 mg by mouth daily., Disp: , Rfl:    KLOR-CON M20 20 MEQ tablet, Take 20 mEq by mouth daily., Disp: , Rfl:    loratadine (CLARITIN) 10 MG tablet, Take 10 mg by mouth daily as needed for allergies., Disp: , Rfl:    metoprolol succinate (TOPROL XL) 25 MG 24 hr tablet, Take 1 tablet (25 mg total) by mouth daily., Disp: 90 tablet, Rfl: 3   Multiple Vitamins-Minerals (ZINC PO), Take 1 tablet by mouth daily., Disp: , Rfl:    omeprazole (PRILOSEC) 20 MG capsule, Take 40 mg by mouth daily., Disp: , Rfl:    rosuvastatin (CRESTOR) 10 MG tablet, Take 10 mg by mouth daily., Disp: , Rfl:    sildenafil (VIAGRA) 100 MG tablet, Take 100 mg by mouth daily as needed for erectile dysfunction., Disp: , Rfl:    VITAMIN D PO, Take 1 capsule by mouth daily., Disp: , Rfl:    warfarin (COUMADIN) 5 MG tablet, Take 2.5-5 mg by mouth See admin instructions. Or as directed by coumadin clinic Take 5 mg on Sun, Tues,Thurs, Sat. Take 2.5 mg on Mon, Wed, and Fri, Disp: , Rfl:   No orders of the defined types were placed in this encounter.   There are no Patient Instructions on file for this visit.   --Continue cardiac medications as reconciled in final medication list. --Return in about 1 year (around 12/04/2021) for Follow up, A. fib, hx of mitral valve repair. . Or sooner if needed. --Continue follow-up with your primary care physician regarding the management of your other chronic comorbid conditions.  Patient's questions and concerns were addressed to his satisfaction. He voices understanding of the instructions provided during this encounter.   This note was created using a voice recognition software as a result there may be grammatical errors inadvertently enclosed that do not reflect the nature of this encounter. Every attempt is made to correct  such errors.   Rex Kras, [place], [facility]  Pager: [phone] Office: [phone]

## 2020-12-08 ENCOUNTER — Ambulatory Visit: Payer: Self-pay | Admitting: Pharmacist

## 2020-12-08 DIAGNOSIS — Z9889 Other specified postprocedural states: Secondary | ICD-10-CM | POA: Diagnosis not present

## 2020-12-08 DIAGNOSIS — C61 Malignant neoplasm of prostate: Secondary | ICD-10-CM | POA: Diagnosis not present

## 2020-12-08 DIAGNOSIS — Z7901 Long term (current) use of anticoagulants: Secondary | ICD-10-CM | POA: Diagnosis not present

## 2020-12-08 DIAGNOSIS — Z5181 Encounter for therapeutic drug level monitoring: Secondary | ICD-10-CM

## 2020-12-08 DIAGNOSIS — I480 Paroxysmal atrial fibrillation: Secondary | ICD-10-CM | POA: Diagnosis not present

## 2020-12-08 LAB — POCT INR: INR: 2.2 (ref 2.0–3.0)

## 2020-12-08 NOTE — Progress Notes (Signed)
Anticoagulation Management Robert Lynn is a 70 y.o. male who reports to the clinic for monitoring of warfarin treatment.    Indication:  Paroxysmal Atrial Fibrillation, H/o mitral valve repair  CHA2DS2 Vasc Score 4 (Age 12-74, HTN hx, Hx of stroke), HAS-BLED 2 (Age>65, Stroke hx)  Duration: indefinite Supervising physician: Rex Kras  Anticoagulation Clinic Visit History:  Patient does not report signs/symptoms of bleeding or thromboembolism   Other recent changes: No change in diet, medications, lifestyle  Continues to do well without any noted changes. Maintains Vit K intake of 3-4 serving/week.   S/p right/left heart cath and coronary angiography on 10/28/20. Pt recovering well without any noted post op complications. No significant CAD. 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   Anticoagulation Episode Summary     Current INR goal:  2.0-3.0  TTR:  56.9 % (5.3 mo)  Next INR check:  12/15/2020  INR from last check:  2.2 (12/08/2020)  Weekly max warfarin dose:    Target end date:  Indefinite  INR check location:    Preferred lab:    Send INR reminders to:     Indications   H/O mitral valve repair [Z98.890] Monitoring for long-term anticoagulant use [Z51.81 Z79.01] Paroxysmal atrial fibrillation (HCC) [I48.0]        Comments:           No Known Allergies  Current Outpatient Medications:    acetaminophen (TYLENOL) 500 MG tablet, Take 1,000 mg by mouth every 6 (six) hours as needed for moderate pain or headache., Disp: , Rfl:    amLODipine (NORVASC) 5 MG tablet, Take 5 mg by mouth daily., Disp: , Rfl:    beclomethasone (QVAR) 80 MCG/ACT inhaler, Inhale 1 puff into the lungs daily., Disp: , Rfl:    ECHINACEA PO, Take 2 tablets by mouth 4 (four) times daily as needed (cold symptoms)., Disp: , Rfl:    fluticasone (FLONASE) 50 MCG/ACT nasal spray, Place 1 spray into both nostrils daily., Disp: , Rfl:    hydrochlorothiazide (HYDRODIURIL)  25 MG tablet, Take 25 mg by mouth daily., Disp: , Rfl:    KLOR-CON M20 20 MEQ tablet, Take 20 mEq by mouth daily., Disp: , Rfl:    loratadine (CLARITIN) 10 MG tablet, Take 10 mg by mouth daily as needed for allergies., Disp: , Rfl:    metoprolol succinate (TOPROL XL) 25 MG 24 hr tablet, Take 1 tablet (25 mg total) by mouth daily., Disp: 90 tablet, Rfl: 3   Multiple Vitamins-Minerals (ZINC PO), Take 1 tablet by mouth daily., Disp: , Rfl:    omeprazole (PRILOSEC) 20 MG capsule, Take 40 mg by mouth daily., Disp: , Rfl:    rosuvastatin (CRESTOR) 10 MG tablet, Take 10 mg by mouth daily., Disp: , Rfl:    sildenafil (VIAGRA) 100 MG tablet, Take 100 mg by mouth daily as needed for erectile dysfunction., Disp: , Rfl:    VITAMIN D PO, Take 1 capsule by mouth daily., Disp: , Rfl:    warfarin (COUMADIN) 5 MG tablet, Take 2.5-5 mg by mouth See admin instructions. Or as directed by coumadin clinic Take 5 mg on Sun, Tues,Thurs, Sat. Take 2.5 mg on Mon, Wed, and Fri, Disp: , Rfl:  Past Medical History:  Diagnosis Date   Allergy    Atrial fibrillation ([place])    Heart murmur    Hyperlipidemia    Hypertension    Melanoma ([place])    MVP (mitral valve prolapse)  Prostate cancer ([place])    PVC (premature ventricular contraction)    Stroke [facility])     ASSESSMENT  Recent Results: The most recent result is correlated with 27.5 mg per week:  Lab Results  Component Value Date   INR 2.2 12/08/2020   INR 2.4 12/01/2020   INR 2.3 11/24/2020    Anticoagulation Dosing:   INR today: Therapeutic. INR continues to remain stable on current weekly dose. Denies any complains of bleeding or bruising symptoms. Denies any other relevant changes in her diet, medications, or lifestyle. Will continue current weekly dose and continue close monitoring. Marland Kitchen   PLAN Weekly dose was unchanged by  0 % to 27.5 mg per week. Continue current weekly dose of 2.5 mg every Mon, Wed, Fri and 5 mg all other days. Recheck INR in 1 weeks.    Patient Instructions  INR at goal. Continue current weekly dose of 2.5 mg every Mon, Wed, Fri and 5 mg all other days. Recheck INR in 1 week.  Patient advised to contact clinic or seek medical attention if signs/symptoms of bleeding or thromboembolism occur.  Patient verbalized understanding by repeating back information and was advised to contact me if further medication-related questions arise.   Follow-up Return in about 1 week (around 12/15/2020).  [name], PharmD  15 minutes spent face-to-face with the patient during the encounter. 50% of time spent on education, including signs/sx bleeding and clotting, as well as food and drug interactions with warfarin. 50% of time was spent on fingerprick POC INR sample collection,processing, results determination, and documentation

## 2020-12-08 NOTE — Patient Instructions (Signed)
INR at goal. Continue current weekly dose of 2.5 mg every Mon, Wed, Fri and 5 mg all other days. Recheck INR in 1 week.

## 2020-12-11 DIAGNOSIS — H903 Sensorineural hearing loss, bilateral: Secondary | ICD-10-CM | POA: Diagnosis not present

## 2020-12-15 ENCOUNTER — Ambulatory Visit: Payer: Self-pay | Admitting: Pharmacist

## 2020-12-15 ENCOUNTER — Other Ambulatory Visit: Payer: Self-pay

## 2020-12-15 DIAGNOSIS — Z9889 Other specified postprocedural states: Secondary | ICD-10-CM

## 2020-12-15 DIAGNOSIS — Z5181 Encounter for therapeutic drug level monitoring: Secondary | ICD-10-CM | POA: Diagnosis not present

## 2020-12-15 DIAGNOSIS — Z7901 Long term (current) use of anticoagulants: Secondary | ICD-10-CM

## 2020-12-15 DIAGNOSIS — I480 Paroxysmal atrial fibrillation: Secondary | ICD-10-CM

## 2020-12-15 DIAGNOSIS — N5201 Erectile dysfunction due to arterial insufficiency: Secondary | ICD-10-CM | POA: Diagnosis not present

## 2020-12-15 DIAGNOSIS — C61 Malignant neoplasm of prostate: Secondary | ICD-10-CM | POA: Diagnosis not present

## 2020-12-15 LAB — POCT INR: INR: 2.5 (ref 2.0–3.0)

## 2020-12-15 NOTE — Patient Instructions (Signed)
INR at goal. Continue current weekly dose of 2.5 mg every Mon, Wed, Fri and 5 mg all other days. Recheck INR in 1 week.

## 2020-12-15 NOTE — Progress Notes (Signed)
Anticoagulation Management Robert Lynn is a 70 y.o. male who reports to the clinic for monitoring of warfarin treatment.    Indication:  Paroxysmal Atrial Fibrillation, H/o mitral valve repair  CHA2DS2 Vasc Score 4 (Age 32-74, HTN hx, Hx of stroke), HAS-BLED 2 (Age>65, Stroke hx)  Duration: indefinite Supervising physician: Rex Kras  Anticoagulation Clinic Visit History:  Patient does not report signs/symptoms of bleeding or thromboembolism   Other recent changes: No change in diet, medications, lifestyle  Continues to do well without any noted changes. Maintains Vit K intake of 3-4 serving/week.   S/p right/left heart cath and coronary angiography on 10/28/20. Pt recovering well without any noted post op complications. No significant CAD. 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   Anticoagulation Episode Summary     Current INR goal:  2.0-3.0  TTR:  58.8 % (5.5 mo)  Next INR check:  12/22/2020  INR from last check:  2.5 (12/15/2020)  Weekly max warfarin dose:    Target end date:  Indefinite  INR check location:    Preferred lab:    Send INR reminders to:     Indications   H/O mitral valve repair [Z98.890] Monitoring for long-term anticoagulant use [Z51.81 Z79.01] Paroxysmal atrial fibrillation (HCC) [I48.0]        Comments:           No Known Allergies  Current Outpatient Medications:    acetaminophen (TYLENOL) 500 MG tablet, Take 1,000 mg by mouth every 6 (six) hours as needed for moderate pain or headache., Disp: , Rfl:    amLODipine (NORVASC) 5 MG tablet, Take 5 mg by mouth daily., Disp: , Rfl:    beclomethasone (QVAR) 80 MCG/ACT inhaler, Inhale 1 puff into the lungs daily., Disp: , Rfl:    ECHINACEA PO, Take 2 tablets by mouth 4 (four) times daily as needed (cold symptoms)., Disp: , Rfl:    fluticasone (FLONASE) 50 MCG/ACT nasal spray, Place 1 spray into both nostrils daily., Disp: , Rfl:    hydrochlorothiazide (HYDRODIURIL)  25 MG tablet, Take 25 mg by mouth daily., Disp: , Rfl:    KLOR-CON M20 20 MEQ tablet, Take 20 mEq by mouth daily., Disp: , Rfl:    loratadine (CLARITIN) 10 MG tablet, Take 10 mg by mouth daily as needed for allergies., Disp: , Rfl:    metoprolol succinate (TOPROL XL) 25 MG 24 hr tablet, Take 1 tablet (25 mg total) by mouth daily., Disp: 90 tablet, Rfl: 3   Multiple Vitamins-Minerals (ZINC PO), Take 1 tablet by mouth daily., Disp: , Rfl:    omeprazole (PRILOSEC) 20 MG capsule, Take 40 mg by mouth daily., Disp: , Rfl:    rosuvastatin (CRESTOR) 10 MG tablet, Take 10 mg by mouth daily., Disp: , Rfl:    sildenafil (VIAGRA) 100 MG tablet, Take 100 mg by mouth daily as needed for erectile dysfunction., Disp: , Rfl:    VITAMIN D PO, Take 1 capsule by mouth daily., Disp: , Rfl:    warfarin (COUMADIN) 5 MG tablet, Take 2.5-5 mg by mouth See admin instructions. Or as directed by coumadin clinic Take 5 mg on Sun, Tues,Thurs, Sat. Take 2.5 mg on Mon, Wed, and Fri, Disp: , Rfl:  Past Medical History:  Diagnosis Date   Allergy    Atrial fibrillation ([place])    Heart murmur    Hyperlipidemia    Hypertension    Melanoma ([place])    MVP (mitral valve prolapse)  Prostate cancer ([place])    PVC (premature ventricular contraction)    Stroke [facility])     ASSESSMENT  Recent Results: The most recent result is correlated with 27.5 mg per week:  Lab Results  Component Value Date   INR 2.5 12/15/2020   INR 2.2 12/08/2020   INR 2.4 12/01/2020    Anticoagulation Dosing:   INR today: Therapeutic. INR continues to remain stable on current weekly dose. Denies any complains of bleeding or bruising symptoms. Denies any other relevant changes in her diet, medications, or lifestyle. Will continue current weekly dose and continue close monitoring. Marland Kitchen   PLAN Weekly dose was unchanged by  0 % to 27.5 mg per week. Continue current weekly dose of 2.5 mg every Mon, Wed, Fri and 5 mg all other days. Recheck INR in 1 weeks.    Patient Instructions  INR at goal. Continue current weekly dose of 2.5 mg every Mon, Wed, Fri and 5 mg all other days. Recheck INR in 1 week.  Patient advised to contact clinic or seek medical attention if signs/symptoms of bleeding or thromboembolism occur.  Patient verbalized understanding by repeating back information and was advised to contact me if further medication-related questions arise.   Follow-up Return in about 1 week (around 12/22/2020).  [name], PharmD  15 minutes spent face-to-face with the patient during the encounter. 50% of time spent on education, including signs/sx bleeding and clotting, as well as food and drug interactions with warfarin. 50% of time was spent on fingerprick POC INR sample collection,processing, results determination, and documentation

## 2020-12-22 ENCOUNTER — Ambulatory Visit: Payer: Self-pay | Admitting: Pharmacist

## 2020-12-22 DIAGNOSIS — I480 Paroxysmal atrial fibrillation: Secondary | ICD-10-CM | POA: Diagnosis not present

## 2020-12-22 DIAGNOSIS — Z9889 Other specified postprocedural states: Secondary | ICD-10-CM | POA: Diagnosis not present

## 2020-12-22 DIAGNOSIS — Z7901 Long term (current) use of anticoagulants: Secondary | ICD-10-CM | POA: Diagnosis not present

## 2020-12-22 DIAGNOSIS — Z5181 Encounter for therapeutic drug level monitoring: Secondary | ICD-10-CM

## 2020-12-22 LAB — POCT INR: INR: 2.5 (ref 2.0–3.0)

## 2020-12-22 NOTE — Patient Instructions (Signed)
INR at goal. Continue current weekly dose of 2.5 mg every Mon, Wed, Fri and 5 mg all other days. Recheck INR in 1 week.

## 2020-12-22 NOTE — Progress Notes (Signed)
Anticoagulation Management Robert Lynn is a 70 y.o. male who reports to the clinic for monitoring of warfarin treatment.    Indication:  Paroxysmal Atrial Fibrillation, H/o mitral valve repair  CHA2DS2 Vasc Score 4 (Age 34-74, HTN hx, Hx of stroke), HAS-BLED 2 (Age>65, Stroke hx)  Duration: indefinite Supervising physician: Rex Kras  Anticoagulation Clinic Visit History:  Patient does not report signs/symptoms of bleeding or thromboembolism   Other recent changes: No change in diet, medications, lifestyle  Continues to do well without any noted changes. Maintains Vit K intake of 3-4 serving/week.   S/p right/left heart cath and coronary angiography on 10/28/20. Pt recovering well without any noted post op complications. No significant CAD. 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   Anticoagulation Episode Summary     Current INR goal:  2.0-3.0  TTR:  60.6 % (5.7 mo)  Next INR check:  12/29/2020  INR from last check:  2.5 (12/22/2020)  Weekly max warfarin dose:    Target end date:  Indefinite  INR check location:    Preferred lab:    Send INR reminders to:     Indications   H/O mitral valve repair [Z98.890] Monitoring for long-term anticoagulant use [Z51.81 Z79.01] Paroxysmal atrial fibrillation (HCC) [I48.0]        Comments:           No Known Allergies  Current Outpatient Medications:    acetaminophen (TYLENOL) 500 MG tablet, Take 1,000 mg by mouth every 6 (six) hours as needed for moderate pain or headache., Disp: , Rfl:    amLODipine (NORVASC) 5 MG tablet, Take 5 mg by mouth daily., Disp: , Rfl:    beclomethasone (QVAR) 80 MCG/ACT inhaler, Inhale 1 puff into the lungs daily., Disp: , Rfl:    ECHINACEA PO, Take 2 tablets by mouth 4 (four) times daily as needed (cold symptoms)., Disp: , Rfl:    fluticasone (FLONASE) 50 MCG/ACT nasal spray, Place 1 spray into both nostrils daily., Disp: , Rfl:    hydrochlorothiazide  (HYDRODIURIL) 25 MG tablet, Take 25 mg by mouth daily., Disp: , Rfl:    KLOR-CON M20 20 MEQ tablet, Take 20 mEq by mouth daily., Disp: , Rfl:    loratadine (CLARITIN) 10 MG tablet, Take 10 mg by mouth daily as needed for allergies., Disp: , Rfl:    metoprolol succinate (TOPROL XL) 25 MG 24 hr tablet, Take 1 tablet (25 mg total) by mouth daily., Disp: 90 tablet, Rfl: 3   Multiple Vitamins-Minerals (ZINC PO), Take 1 tablet by mouth daily., Disp: , Rfl:    omeprazole (PRILOSEC) 20 MG capsule, Take 40 mg by mouth daily., Disp: , Rfl:    rosuvastatin (CRESTOR) 10 MG tablet, Take 10 mg by mouth daily., Disp: , Rfl:    sildenafil (VIAGRA) 100 MG tablet, Take 100 mg by mouth daily as needed for erectile dysfunction., Disp: , Rfl:    VITAMIN D PO, Take 1 capsule by mouth daily., Disp: , Rfl:    warfarin (COUMADIN) 5 MG tablet, Take 2.5-5 mg by mouth See admin instructions. Or as directed by coumadin clinic Take 5 mg on Sun, Tues,Thurs, Sat. Take 2.5 mg on Mon, Wed, and Fri, Disp: , Rfl:  Past Medical History:  Diagnosis Date   Allergy    Atrial fibrillation ([place])    Heart murmur    Hyperlipidemia    Hypertension    Melanoma ([place])    MVP (mitral valve prolapse)  Prostate cancer ([place])    PVC (premature ventricular contraction)    Stroke [facility])     ASSESSMENT  Recent Results: The most recent result is correlated with 27.5 mg per week:  Lab Results  Component Value Date   INR 2.5 12/22/2020   INR 2.5 12/15/2020   INR 2.2 12/08/2020    Anticoagulation Dosing:   INR today: Therapeutic. INR continues to remain stable on current weekly dose. Denies any complains of bleeding or bruising symptoms. Denies any other relevant changes in her diet, medications, or lifestyle. Will continue current weekly dose and continue close monitoring.   PLAN Weekly dose was unchanged by  0 % to 27.5 mg per week. Continue current weekly dose of 2.5 mg every Mon, Wed, Fri and 5 mg all other days. Recheck INR in 1  weeks.   Patient Instructions  INR at goal. Continue current weekly dose of 2.5 mg every Mon, Wed, Fri and 5 mg all other days. Recheck INR in 1 week.  Patient advised to contact clinic or seek medical attention if signs/symptoms of bleeding or thromboembolism occur.  Patient verbalized understanding by repeating back information and was advised to contact me if further medication-related questions arise.   Follow-up Return in about 1 week (around 12/29/2020).  [name], PharmD  15 minutes spent face-to-face with the patient during the encounter. 50% of time spent on education, including signs/sx bleeding and clotting, as well as food and drug interactions with warfarin. 50% of time was spent on fingerprick POC INR sample collection,processing, results determination, and documentation

## 2020-12-29 ENCOUNTER — Ambulatory Visit: Payer: Self-pay | Admitting: Pharmacist

## 2020-12-29 DIAGNOSIS — Z5181 Encounter for therapeutic drug level monitoring: Secondary | ICD-10-CM

## 2020-12-29 DIAGNOSIS — I480 Paroxysmal atrial fibrillation: Secondary | ICD-10-CM | POA: Diagnosis not present

## 2020-12-29 DIAGNOSIS — Z9889 Other specified postprocedural states: Secondary | ICD-10-CM

## 2020-12-29 DIAGNOSIS — Z7901 Long term (current) use of anticoagulants: Secondary | ICD-10-CM | POA: Diagnosis not present

## 2020-12-29 LAB — POCT INR: INR: 2.5 (ref 2.0–3.0)

## 2020-12-29 NOTE — Patient Instructions (Signed)
INR at goal. Continue current weekly dose of 2.5 mg every Mon, Wed, Fri and 5 mg all other days. Recheck INR in 1 week.

## 2020-12-29 NOTE — Progress Notes (Signed)
Anticoagulation Management Robert Lynn is a 70 y.o. male who reports to the clinic for monitoring of warfarin treatment.    Indication:  Paroxysmal Atrial Fibrillation, H/o mitral valve repair  CHA2DS2 Vasc Score 4 (Age 51-74, HTN hx, Hx of stroke), HAS-BLED 2 (Age>65, Stroke hx)  Duration: indefinite Supervising physician: Rex Kras  Anticoagulation Clinic Visit History:  Patient does not report signs/symptoms of bleeding or thromboembolism   Other recent changes: No change in diet, medications, lifestyle  Continues to do well without any noted changes. Maintains Vit K intake of 3-4 serving/week.   S/p right/left heart cath and coronary angiography on 10/28/20. Pt recovering well without any noted post op complications. No significant CAD. 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   Anticoagulation Episode Summary     Current INR goal:  2.0-3.0  TTR:  62.0 % (6 mo)  Next INR check:  01/05/2021  INR from last check:  2.5 (12/29/2020)  Weekly max warfarin dose:    Target end date:  Indefinite  INR check location:    Preferred lab:    Send INR reminders to:     Indications   H/O mitral valve repair [Z98.890] Monitoring for long-term anticoagulant use [Z51.81 Z79.01] Paroxysmal atrial fibrillation (HCC) [I48.0]        Comments:           No Known Allergies  Current Outpatient Medications:    acetaminophen (TYLENOL) 500 MG tablet, Take 1,000 mg by mouth every 6 (six) hours as needed for moderate pain or headache., Disp: , Rfl:    amLODipine (NORVASC) 5 MG tablet, Take 5 mg by mouth daily., Disp: , Rfl:    beclomethasone (QVAR) 80 MCG/ACT inhaler, Inhale 1 puff into the lungs daily., Disp: , Rfl:    ECHINACEA PO, Take 2 tablets by mouth 4 (four) times daily as needed (cold symptoms)., Disp: , Rfl:    fluticasone (FLONASE) 50 MCG/ACT nasal spray, Place 1 spray into both nostrils daily., Disp: , Rfl:    hydrochlorothiazide (HYDRODIURIL)  25 MG tablet, Take 25 mg by mouth daily., Disp: , Rfl:    KLOR-CON M20 20 MEQ tablet, Take 20 mEq by mouth daily., Disp: , Rfl:    loratadine (CLARITIN) 10 MG tablet, Take 10 mg by mouth daily as needed for allergies., Disp: , Rfl:    metoprolol succinate (TOPROL XL) 25 MG 24 hr tablet, Take 1 tablet (25 mg total) by mouth daily., Disp: 90 tablet, Rfl: 3   Multiple Vitamins-Minerals (ZINC PO), Take 1 tablet by mouth daily., Disp: , Rfl:    omeprazole (PRILOSEC) 20 MG capsule, Take 40 mg by mouth daily., Disp: , Rfl:    rosuvastatin (CRESTOR) 10 MG tablet, Take 10 mg by mouth daily., Disp: , Rfl:    sildenafil (VIAGRA) 100 MG tablet, Take 100 mg by mouth daily as needed for erectile dysfunction., Disp: , Rfl:    VITAMIN D PO, Take 1 capsule by mouth daily., Disp: , Rfl:    warfarin (COUMADIN) 5 MG tablet, Take 2.5-5 mg by mouth See admin instructions. Or as directed by coumadin clinic Take 5 mg on Sun, Tues,Thurs, Sat. Take 2.5 mg on Mon, Wed, and Fri, Disp: , Rfl:  Past Medical History:  Diagnosis Date   Allergy    Atrial fibrillation ([place])    Heart murmur    Hyperlipidemia    Hypertension    Melanoma ([place])    MVP (mitral valve prolapse)  Prostate cancer ([place])    PVC (premature ventricular contraction)    Stroke [facility])     ASSESSMENT  Recent Results: The most recent result is correlated with 27.5 mg per week:  Lab Results  Component Value Date   INR 2.5 12/29/2020   INR 2.5 12/22/2020   INR 2.5 12/15/2020    Anticoagulation Dosing:   INR today: Therapeutic. INR continues to remain stable on current weekly dose. Denies any complains of bleeding or bruising symptoms. Denies any other relevant changes in her diet, medications, or lifestyle. Will continue current weekly dose and continue close monitoring.   PLAN Weekly dose was unchanged by  0 % to 27.5 mg per week. Continue current weekly dose of 2.5 mg every Mon, Wed, Fri and 5 mg all other days. Recheck INR in 1 weeks.    Patient Instructions  INR at goal. Continue current weekly dose of 2.5 mg every Mon, Wed, Fri and 5 mg all other days. Recheck INR in 1 week.  Patient advised to contact clinic or seek medical attention if signs/symptoms of bleeding or thromboembolism occur.  Patient verbalized understanding by repeating back information and was advised to contact me if further medication-related questions arise.   Follow-up Return in about 1 week (around 01/05/2021).  [name], PharmD  15 minutes spent face-to-face with the patient during the encounter. 50% of time spent on education, including signs/sx bleeding and clotting, as well as food and drug interactions with warfarin. 50% of time was spent on fingerprick POC INR sample collection,processing, results determination, and documentation

## 2021-01-05 ENCOUNTER — Ambulatory Visit: Payer: Self-pay | Admitting: Pharmacist

## 2021-01-05 DIAGNOSIS — I480 Paroxysmal atrial fibrillation: Secondary | ICD-10-CM

## 2021-01-05 DIAGNOSIS — Z7901 Long term (current) use of anticoagulants: Secondary | ICD-10-CM | POA: Diagnosis not present

## 2021-01-05 DIAGNOSIS — Z5181 Encounter for therapeutic drug level monitoring: Secondary | ICD-10-CM | POA: Diagnosis not present

## 2021-01-05 DIAGNOSIS — Z9889 Other specified postprocedural states: Secondary | ICD-10-CM

## 2021-01-05 LAB — POCT INR: INR: 2.6 (ref 2.0–3.0)

## 2021-01-05 NOTE — Progress Notes (Signed)
Anticoagulation Management Robert Lynn is a 70 y.o. male who reports to the clinic for monitoring of warfarin treatment.    Indication:  Paroxysmal Atrial Fibrillation, H/o mitral valve repair  CHA2DS2 Vasc Score 4 (Age 41-74, HTN hx, Hx of stroke), HAS-BLED 2 (Age>65, Stroke hx)  Duration: indefinite Supervising physician: Rex Kras  Anticoagulation Clinic Visit History:  Patient does not report signs/symptoms of bleeding or thromboembolism   Other recent changes: No change in diet, medications, lifestyle  Continues to do well without any noted changes. Maintains Vit K intake of 3-4 serving/week.   No noted changes.    Anticoagulation Episode Summary     Current INR goal:  2.0-3.0  TTR:  63.5 % (6.2 mo)  Next INR check:  01/12/2021  INR from last check:  2.6 (01/05/2021)  Weekly max warfarin dose:    Target end date:  Indefinite  INR check location:    Preferred lab:    Send INR reminders to:     Indications   H/O mitral valve repair [Z98.890] Monitoring for long-term anticoagulant use [Z51.81 Z79.01] Paroxysmal atrial fibrillation (HCC) [I48.0]        Comments:           No Known Allergies  Current Outpatient Medications:    acetaminophen (TYLENOL) 500 MG tablet, Take 1,000 mg by mouth every 6 (six) hours as needed for moderate pain or headache., Disp: , Rfl:    amLODipine (NORVASC) 5 MG tablet, Take 5 mg by mouth daily., Disp: , Rfl:    beclomethasone (QVAR) 80 MCG/ACT inhaler, Inhale 1 puff into the lungs daily., Disp: , Rfl:    ECHINACEA PO, Take 2 tablets by mouth 4 (four) times daily as needed (cold symptoms)., Disp: , Rfl:    fluticasone (FLONASE) 50 MCG/ACT nasal spray, Place 1 spray into both nostrils daily., Disp: , Rfl:    hydrochlorothiazide (HYDRODIURIL) 25 MG tablet, Take 25 mg by mouth daily., Disp: , Rfl:    KLOR-CON M20 20 MEQ tablet, Take 20 mEq by mouth daily., Disp: , Rfl:    loratadine (CLARITIN) 10 MG tablet, Take 10 mg by mouth daily  as needed for allergies., Disp: , Rfl:    metoprolol succinate (TOPROL XL) 25 MG 24 hr tablet, Take 1 tablet (25 mg total) by mouth daily., Disp: 90 tablet, Rfl: 3   Multiple Vitamins-Minerals (ZINC PO), Take 1 tablet by mouth daily., Disp: , Rfl:    omeprazole (PRILOSEC) 20 MG capsule, Take 40 mg by mouth daily., Disp: , Rfl:    rosuvastatin (CRESTOR) 10 MG tablet, Take 10 mg by mouth daily., Disp: , Rfl:    sildenafil (VIAGRA) 100 MG tablet, Take 100 mg by mouth daily as needed for erectile dysfunction., Disp: , Rfl:    VITAMIN D PO, Take 1 capsule by mouth daily., Disp: , Rfl:    warfarin (COUMADIN) 5 MG tablet, Take 2.5-5 mg by mouth See admin instructions. Or as directed by coumadin clinic Take 5 mg on Sun, Tues,Thurs, Sat. Take 2.5 mg on Mon, Wed, and Fri, Disp: , Rfl:  Past Medical History:  Diagnosis Date   Allergy    Atrial fibrillation ([place])    Heart murmur    Hyperlipidemia    Hypertension    Melanoma ([place])    MVP (mitral valve prolapse)    Prostate cancer (HCC)    PVC (premature ventricular contraction)    Stroke [facility])     ASSESSMENT  Recent Results: The most recent result is correlated  with 27.5 mg per week:  Lab Results  Component Value Date   INR 2.6 01/05/2021   INR 2.5 12/29/2020   INR 2.5 12/22/2020    Anticoagulation Dosing:   INR today: Therapeutic. INR continues to remain stable on current weekly dose. Denies any complains of bleeding or bruising symptoms. Denies any other relevant changes in her diet, medications, or lifestyle. Will continue current weekly dose and continue close monitoring.   PLAN Weekly dose was unchanged by  0 % to 27.5 mg per week. Continue current weekly dose of 2.5 mg every Mon, Wed, Fri and 5 mg all other days. Recheck INR in 1 weeks.   Patient Instructions  INR at goal. Continue current weekly dose of 2.5 mg every Mon, Wed, Fri and 5 mg all other days. Recheck INR in 1 week.  Patient advised to contact clinic or seek medical  attention if signs/symptoms of bleeding or thromboembolism occur.  Patient verbalized understanding by repeating back information and was advised to contact me if further medication-related questions arise.   Follow-up Return in about 1 week (around 01/12/2021).  [name], PharmD  15 minutes spent face-to-face with the patient during the encounter. 50% of time spent on education, including signs/sx bleeding and clotting, as well as food and drug interactions with warfarin. 50% of time was spent on fingerprick POC INR sample collection,processing, results determination, and documentation

## 2021-01-05 NOTE — Patient Instructions (Signed)
INR at goal. Continue current weekly dose of 2.5 mg every Mon, Wed, Fri and 5 mg all other days. Recheck INR in 1 week.

## 2021-01-12 ENCOUNTER — Ambulatory Visit: Payer: Self-pay | Admitting: Pharmacist

## 2021-01-12 DIAGNOSIS — Z5181 Encounter for therapeutic drug level monitoring: Secondary | ICD-10-CM | POA: Diagnosis not present

## 2021-01-12 DIAGNOSIS — I480 Paroxysmal atrial fibrillation: Secondary | ICD-10-CM | POA: Diagnosis not present

## 2021-01-12 DIAGNOSIS — Z9889 Other specified postprocedural states: Secondary | ICD-10-CM | POA: Diagnosis not present

## 2021-01-12 DIAGNOSIS — Z7901 Long term (current) use of anticoagulants: Secondary | ICD-10-CM | POA: Diagnosis not present

## 2021-01-12 LAB — POCT INR: INR: 2.4 (ref 2.0–3.0)

## 2021-01-12 NOTE — Patient Instructions (Signed)
INR at goal. Continue current weekly dose of 2.5 mg every Mon, Wed, Fri and 5 mg all other days. Recheck INR in 1 week.

## 2021-01-12 NOTE — Progress Notes (Signed)
Anticoagulation Management Robert Lynn is a 70 y.o. male who reports to the clinic for monitoring of warfarin treatment.    Indication:  Paroxysmal Atrial Fibrillation, H/o mitral valve repair  CHA2DS2 Vasc Score 4 (Age 49-74, HTN hx, Hx of stroke), HAS-BLED 2 (Age>65, Stroke hx)  Duration: indefinite Supervising physician: Rex Kras  Anticoagulation Clinic Visit History:  Patient does not report signs/symptoms of bleeding or thromboembolism   Other recent changes: No change in diet, medications, lifestyle  Continues to do well without any noted changes. Maintains Vit K intake of 3-4 serving/week.   No noted changes.    Anticoagulation Episode Summary     Current INR goal:  2.0-3.0  TTR:  64.8 % (6.4 mo)  Next INR check:  01/19/2021  INR from last check:  2.4 (01/12/2021)  Weekly max warfarin dose:    Target end date:  Indefinite  INR check location:    Preferred lab:    Send INR reminders to:     Indications   H/O mitral valve repair [Z98.890] Monitoring for long-term anticoagulant use [Z51.81 Z79.01] Paroxysmal atrial fibrillation (HCC) [I48.0]        Comments:           No Known Allergies  Current Outpatient Medications:    acetaminophen (TYLENOL) 500 MG tablet, Take 1,000 mg by mouth every 6 (six) hours as needed for moderate pain or headache., Disp: , Rfl:    amLODipine (NORVASC) 5 MG tablet, Take 5 mg by mouth daily., Disp: , Rfl:    beclomethasone (QVAR) 80 MCG/ACT inhaler, Inhale 1 puff into the lungs daily., Disp: , Rfl:    ECHINACEA PO, Take 2 tablets by mouth 4 (four) times daily as needed (cold symptoms)., Disp: , Rfl:    fluticasone (FLONASE) 50 MCG/ACT nasal spray, Place 1 spray into both nostrils daily., Disp: , Rfl:    hydrochlorothiazide (HYDRODIURIL) 25 MG tablet, Take 25 mg by mouth daily., Disp: , Rfl:    KLOR-CON M20 20 MEQ tablet, Take 20 mEq by mouth daily., Disp: , Rfl:    loratadine (CLARITIN) 10 MG tablet, Take 10 mg by mouth daily  as needed for allergies., Disp: , Rfl:    metoprolol succinate (TOPROL XL) 25 MG 24 hr tablet, Take 1 tablet (25 mg total) by mouth daily., Disp: 90 tablet, Rfl: 3   Multiple Vitamins-Minerals (ZINC PO), Take 1 tablet by mouth daily., Disp: , Rfl:    omeprazole (PRILOSEC) 20 MG capsule, Take 40 mg by mouth daily., Disp: , Rfl:    rosuvastatin (CRESTOR) 10 MG tablet, Take 10 mg by mouth daily., Disp: , Rfl:    sildenafil (VIAGRA) 100 MG tablet, Take 100 mg by mouth daily as needed for erectile dysfunction., Disp: , Rfl:    VITAMIN D PO, Take 1 capsule by mouth daily., Disp: , Rfl:    warfarin (COUMADIN) 5 MG tablet, Take 2.5-5 mg by mouth See admin instructions. Or as directed by coumadin clinic Take 5 mg on Sun, Tues,Thurs, Sat. Take 2.5 mg on Mon, Wed, and Fri, Disp: , Rfl:  Past Medical History:  Diagnosis Date   Allergy    Atrial fibrillation ([place])    Heart murmur    Hyperlipidemia    Hypertension    Melanoma ([place])    MVP (mitral valve prolapse)    Prostate cancer (HCC)    PVC (premature ventricular contraction)    Stroke [facility])     ASSESSMENT  Recent Results: The most recent result is correlated  with 27.5 mg per week:  Lab Results  Component Value Date   INR 2.4 01/12/2021   INR 2.6 01/05/2021   INR 2.5 12/29/2020    Anticoagulation Dosing:   INR today: Therapeutic. INR continues to remain stable on current weekly dose. Denies any complains of bleeding or bruising symptoms. Denies any other relevant changes in her diet, medications, or lifestyle. Will continue current weekly dose and continue close monitoring.   PLAN Weekly dose was unchanged by  0 % to 27.5 mg per week. Continue current weekly dose of 2.5 mg every Mon, Wed, Fri and 5 mg all other days. Recheck INR in 1 weeks.   Patient Instructions  INR at goal. Continue current weekly dose of 2.5 mg every Mon, Wed, Fri and 5 mg all other days. Recheck INR in 1 week.  Patient advised to contact clinic or seek medical  attention if signs/symptoms of bleeding or thromboembolism occur.  Patient verbalized understanding by repeating back information and was advised to contact me if further medication-related questions arise.   Follow-up Return in about 1 week (around 01/19/2021).  [name], PharmD  15 minutes spent face-to-face with the patient during the encounter. 50% of time spent on education, including signs/sx bleeding and clotting, as well as food and drug interactions with warfarin. 50% of time was spent on fingerprick POC INR sample collection,processing, results determination, and documentation

## 2021-01-19 ENCOUNTER — Ambulatory Visit: Payer: Self-pay | Admitting: Pharmacist

## 2021-01-19 DIAGNOSIS — Z5181 Encounter for therapeutic drug level monitoring: Secondary | ICD-10-CM | POA: Diagnosis not present

## 2021-01-19 DIAGNOSIS — Z7901 Long term (current) use of anticoagulants: Secondary | ICD-10-CM | POA: Diagnosis not present

## 2021-01-19 DIAGNOSIS — Z9889 Other specified postprocedural states: Secondary | ICD-10-CM

## 2021-01-19 DIAGNOSIS — I480 Paroxysmal atrial fibrillation: Secondary | ICD-10-CM

## 2021-01-19 LAB — POCT INR: INR: 2.3 (ref 2.0–3.0)

## 2021-01-19 NOTE — Progress Notes (Signed)
Anticoagulation Management Robert Lynn is a 70 y.o. male who reports to the clinic for monitoring of warfarin treatment.    Indication:  Paroxysmal Atrial Fibrillation, H/o mitral valve repair  CHA2DS2 Vasc Score 4 (Age 29-74, HTN hx, Hx of stroke), HAS-BLED 2 (Age>65, Stroke hx)  Duration: indefinite Supervising physician: Rex Kras  Anticoagulation Clinic Visit History:  Patient does not report signs/symptoms of bleeding or thromboembolism   Other recent changes: No change in diet, medications, lifestyle  Continues to do well without any noted changes. Maintains Vit K intake of 3-4 serving/week.   No noted changes.    Anticoagulation Episode Summary     Current INR goal:  2.0-3.0  TTR:  66.0 % (6.7 mo)  Next INR check:  01/26/2021  INR from last check:  2.3 (01/19/2021)  Weekly max warfarin dose:    Target end date:  Indefinite  INR check location:    Preferred lab:    Send INR reminders to:     Indications   H/O mitral valve repair [Z98.890] Monitoring for long-term anticoagulant use [Z51.81 Z79.01] Paroxysmal atrial fibrillation (HCC) [I48.0]        Comments:           No Known Allergies  Current Outpatient Medications:    acetaminophen (TYLENOL) 500 MG tablet, Take 1,000 mg by mouth every 6 (six) hours as needed for moderate pain or headache., Disp: , Rfl:    amLODipine (NORVASC) 5 MG tablet, Take 5 mg by mouth daily., Disp: , Rfl:    beclomethasone (QVAR) 80 MCG/ACT inhaler, Inhale 1 puff into the lungs daily., Disp: , Rfl:    ECHINACEA PO, Take 2 tablets by mouth 4 (four) times daily as needed (cold symptoms)., Disp: , Rfl:    fluticasone (FLONASE) 50 MCG/ACT nasal spray, Place 1 spray into both nostrils daily., Disp: , Rfl:    hydrochlorothiazide (HYDRODIURIL) 25 MG tablet, Take 25 mg by mouth daily., Disp: , Rfl:    KLOR-CON M20 20 MEQ tablet, Take 20 mEq by mouth daily., Disp: , Rfl:    loratadine (CLARITIN) 10 MG tablet, Take 10 mg by mouth daily  as needed for allergies., Disp: , Rfl:    metoprolol succinate (TOPROL XL) 25 MG 24 hr tablet, Take 1 tablet (25 mg total) by mouth daily., Disp: 90 tablet, Rfl: 3   Multiple Vitamins-Minerals (ZINC PO), Take 1 tablet by mouth daily., Disp: , Rfl:    omeprazole (PRILOSEC) 20 MG capsule, Take 40 mg by mouth daily., Disp: , Rfl:    rosuvastatin (CRESTOR) 10 MG tablet, Take 10 mg by mouth daily., Disp: , Rfl:    sildenafil (VIAGRA) 100 MG tablet, Take 100 mg by mouth daily as needed for erectile dysfunction., Disp: , Rfl:    VITAMIN D PO, Take 1 capsule by mouth daily., Disp: , Rfl:    warfarin (COUMADIN) 5 MG tablet, Take 2.5-5 mg by mouth See admin instructions. Or as directed by coumadin clinic Take 5 mg on Sun, Tues,Thurs, Sat. Take 2.5 mg on Mon, Wed, and Fri, Disp: , Rfl:  Past Medical History:  Diagnosis Date   Allergy    Atrial fibrillation ([place])    Heart murmur    Hyperlipidemia    Hypertension    Melanoma ([place])    MVP (mitral valve prolapse)    Prostate cancer (HCC)    PVC (premature ventricular contraction)    Stroke [facility])     ASSESSMENT  Recent Results: The most recent result is correlated  with 27.5 mg per week:  Lab Results  Component Value Date   INR 2.3 01/19/2021   INR 2.4 01/12/2021   INR 2.6 01/05/2021    Anticoagulation Dosing:   INR today: Therapeutic. INR continues to remain stable on current weekly dose. Denies any complains of bleeding or bruising symptoms. Denies any other relevant changes in her diet, medications, or lifestyle. Will continue current weekly dose and continue close monitoring.   PLAN Weekly dose was unchanged by  0 % to 27.5 mg per week. Continue current weekly dose of 2.5 mg every Mon, Wed, Fri and 5 mg all other days. Recheck INR in 1 weeks.   Patient Instructions  INR at goal. Continue current weekly dose of 2.5 mg every Mon, Wed, Fri and 5 mg all other days. Recheck INR in 1 week.  Patient advised to contact clinic or seek medical  attention if signs/symptoms of bleeding or thromboembolism occur.  Patient verbalized understanding by repeating back information and was advised to contact me if further medication-related questions arise.   Follow-up Return in about 1 week (around 01/26/2021).  [name], PharmD  15 minutes spent face-to-face with the patient during the encounter. 50% of time spent on education, including signs/sx bleeding and clotting, as well as food and drug interactions with warfarin. 50% of time was spent on fingerprick POC INR sample collection,processing, results determination, and documentation

## 2021-01-19 NOTE — Patient Instructions (Signed)
INR at goal. Continue current weekly dose of 2.5 mg every Mon, Wed, Fri and 5 mg all other days. Recheck INR in 1 week.

## 2021-01-26 ENCOUNTER — Ambulatory Visit: Payer: Self-pay | Admitting: Pharmacist

## 2021-01-26 DIAGNOSIS — Z9889 Other specified postprocedural states: Secondary | ICD-10-CM

## 2021-01-26 DIAGNOSIS — I480 Paroxysmal atrial fibrillation: Secondary | ICD-10-CM

## 2021-01-26 DIAGNOSIS — Z7901 Long term (current) use of anticoagulants: Secondary | ICD-10-CM | POA: Diagnosis not present

## 2021-01-26 DIAGNOSIS — Z5181 Encounter for therapeutic drug level monitoring: Secondary | ICD-10-CM | POA: Diagnosis not present

## 2021-01-26 LAB — POCT INR: INR: 2.6 (ref 2.0–3.0)

## 2021-01-26 NOTE — Progress Notes (Signed)
Anticoagulation Management Robert Lynn is a 70 y.o. male who reports to the clinic for monitoring of warfarin treatment.    Indication:  Paroxysmal Atrial Fibrillation, H/o mitral valve repair  CHA2DS2 Vasc Score 4 (Age 78-74, HTN hx, Hx of stroke), HAS-BLED 2 (Age>65, Stroke hx)  Duration: indefinite Supervising physician: Rex Kras  Anticoagulation Clinic Visit History:  Patient does not report signs/symptoms of bleeding or thromboembolism   Other recent changes: No change in diet, medications, lifestyle  Continues to do well without any noted changes. Maintains Vit K intake of 3-4 serving/week.   No noted changes.    Anticoagulation Episode Summary     Current INR goal:  2.0-3.0  TTR:  67.2 % (6.9 mo)  Next INR check:  02/02/2021  INR from last check:  2.6 (01/26/2021)  Weekly max warfarin dose:    Target end date:  Indefinite  INR check location:    Preferred lab:    Send INR reminders to:     Indications   H/O mitral valve repair [Z98.890] Monitoring for long-term anticoagulant use [Z51.81 Z79.01] Paroxysmal atrial fibrillation (HCC) [I48.0]        Comments:           No Known Allergies  Current Outpatient Medications:    acetaminophen (TYLENOL) 500 MG tablet, Take 1,000 mg by mouth every 6 (six) hours as needed for moderate pain or headache., Disp: , Rfl:    amLODipine (NORVASC) 5 MG tablet, Take 5 mg by mouth daily., Disp: , Rfl:    beclomethasone (QVAR) 80 MCG/ACT inhaler, Inhale 1 puff into the lungs daily., Disp: , Rfl:    ECHINACEA PO, Take 2 tablets by mouth 4 (four) times daily as needed (cold symptoms)., Disp: , Rfl:    fluticasone (FLONASE) 50 MCG/ACT nasal spray, Place 1 spray into both nostrils daily., Disp: , Rfl:    hydrochlorothiazide (HYDRODIURIL) 25 MG tablet, Take 25 mg by mouth daily., Disp: , Rfl:    KLOR-CON M20 20 MEQ tablet, Take 20 mEq by mouth daily., Disp: , Rfl:    loratadine (CLARITIN) 10 MG tablet, Take 10 mg by mouth daily  as needed for allergies., Disp: , Rfl:    metoprolol succinate (TOPROL XL) 25 MG 24 hr tablet, Take 1 tablet (25 mg total) by mouth daily., Disp: 90 tablet, Rfl: 3   Multiple Vitamins-Minerals (ZINC PO), Take 1 tablet by mouth daily., Disp: , Rfl:    omeprazole (PRILOSEC) 20 MG capsule, Take 40 mg by mouth daily., Disp: , Rfl:    rosuvastatin (CRESTOR) 10 MG tablet, Take 10 mg by mouth daily., Disp: , Rfl:    sildenafil (VIAGRA) 100 MG tablet, Take 100 mg by mouth daily as needed for erectile dysfunction., Disp: , Rfl:    VITAMIN D PO, Take 1 capsule by mouth daily., Disp: , Rfl:    warfarin (COUMADIN) 5 MG tablet, Take 2.5-5 mg by mouth See admin instructions. Or as directed by coumadin clinic Take 5 mg on Sun, Tues,Thurs, Sat. Take 2.5 mg on Mon, Wed, and Fri, Disp: , Rfl:  Past Medical History:  Diagnosis Date   Allergy    Atrial fibrillation ([place])    Heart murmur    Hyperlipidemia    Hypertension    Melanoma ([place])    MVP (mitral valve prolapse)    Prostate cancer (HCC)    PVC (premature ventricular contraction)    Stroke [facility])     ASSESSMENT  Recent Results: The most recent result is correlated  with 27.5 mg per week:  Lab Results  Component Value Date   INR 2.6 01/26/2021   INR 2.3 01/19/2021   INR 2.4 01/12/2021    Anticoagulation Dosing:   INR today: Therapeutic. INR continues to remain stable on current weekly dose. Denies any complains of bleeding or bruising symptoms. Denies any other relevant changes in her diet, medications, or lifestyle. Will continue current weekly dose and continue close monitoring.   PLAN Weekly dose was unchanged by  0 % to 27.5 mg per week. Continue current weekly dose of 2.5 mg every Mon, Wed, Fri and 5 mg all other days. Recheck INR in 1 weeks.   Patient Instructions  INR at goal. Continue current weekly dose of 2.5 mg every Mon, Wed, Fri and 5 mg all other days. Recheck INR in 1 week.  Patient advised to contact clinic or seek medical  attention if signs/symptoms of bleeding or thromboembolism occur.  Patient verbalized understanding by repeating back information and was advised to contact me if further medication-related questions arise.   Follow-up Return in about 1 week (around 02/02/2021).  [name], PharmD  15 minutes spent face-to-face with the patient during the encounter. 50% of time spent on education, including signs/sx bleeding and clotting, as well as food and drug interactions with warfarin. 50% of time was spent on fingerprick POC INR sample collection,processing, results determination, and documentation

## 2021-01-26 NOTE — Patient Instructions (Signed)
INR at goal. Continue current weekly dose of 2.5 mg every Mon, Wed, Fri and 5 mg all other days. Recheck INR in 1 week.

## 2021-02-10 ENCOUNTER — Ambulatory Visit: Payer: Self-pay | Admitting: Pharmacist

## 2021-02-10 DIAGNOSIS — Z7901 Long term (current) use of anticoagulants: Secondary | ICD-10-CM | POA: Diagnosis not present

## 2021-02-10 DIAGNOSIS — Z5181 Encounter for therapeutic drug level monitoring: Secondary | ICD-10-CM | POA: Diagnosis not present

## 2021-02-10 DIAGNOSIS — I480 Paroxysmal atrial fibrillation: Secondary | ICD-10-CM

## 2021-02-10 DIAGNOSIS — Z9889 Other specified postprocedural states: Secondary | ICD-10-CM

## 2021-02-10 LAB — POCT INR: INR: 3.8 — AB (ref 2.0–3.0)

## 2021-02-10 NOTE — Patient Instructions (Signed)
INR above goal. HOLD today and then continue current weekly dose of 2.5 mg every Mon, Wed, Fri and 5 mg all other days. Recheck INR in 1 week.

## 2021-02-10 NOTE — Progress Notes (Signed)
Anticoagulation Management Robert Lynn is a 71 y.o. male who reports to the clinic for monitoring of warfarin treatment.    Indication:  Paroxysmal Atrial Fibrillation, H/o mitral valve repair  CHA2DS2 Vasc Score 4 (Age 67-74, HTN hx, Hx of stroke), HAS-BLED 2 (Age>65, Stroke hx)  Duration: indefinite Supervising physician: Rex Kras  Anticoagulation Clinic Visit History:  Patient does not report signs/symptoms of bleeding or thromboembolism   Other recent changes: No change in diet, medications, lifestyle  Continues to do well without any noted changes. Maintains Vit K intake of 3-4 serving/week.   Recent sinus infection. Using Mucinex for decongestion relief  Anticoagulation Episode Summary     Current INR goal:  2.0-3.0  TTR:  64.8 % (7.4 mo)  Next INR check:  02/02/2021  INR from last check:  3.8 (02/10/2021)  Weekly max warfarin dose:    Target end date:  Indefinite  INR check location:    Preferred lab:    Send INR reminders to:     Indications   H/O mitral valve repair [Z98.890] Monitoring for long-term anticoagulant use [Z51.81 Z79.01] Paroxysmal atrial fibrillation (HCC) [I48.0]        Comments:           No Known Allergies  Current Outpatient Medications:    acetaminophen (TYLENOL) 500 MG tablet, Take 1,000 mg by mouth every 6 (six) hours as needed for moderate pain or headache., Disp: , Rfl:    amLODipine (NORVASC) 5 MG tablet, Take 5 mg by mouth daily., Disp: , Rfl:    beclomethasone (QVAR) 80 MCG/ACT inhaler, Inhale 1 puff into the lungs daily., Disp: , Rfl:    ECHINACEA PO, Take 2 tablets by mouth 4 (four) times daily as needed (cold symptoms)., Disp: , Rfl:    fluticasone (FLONASE) 50 MCG/ACT nasal spray, Place 1 spray into both nostrils daily., Disp: , Rfl:    hydrochlorothiazide (HYDRODIURIL) 25 MG tablet, Take 25 mg by mouth daily., Disp: , Rfl:    KLOR-CON M20 20 MEQ tablet, Take 20 mEq by mouth daily., Disp: , Rfl:    loratadine (CLARITIN)  10 MG tablet, Take 10 mg by mouth daily as needed for allergies., Disp: , Rfl:    metoprolol succinate (TOPROL XL) 25 MG 24 hr tablet, Take 1 tablet (25 mg total) by mouth daily., Disp: 90 tablet, Rfl: 3   Multiple Vitamins-Minerals (ZINC PO), Take 1 tablet by mouth daily., Disp: , Rfl:    omeprazole (PRILOSEC) 20 MG capsule, Take 40 mg by mouth daily., Disp: , Rfl:    rosuvastatin (CRESTOR) 10 MG tablet, Take 10 mg by mouth daily., Disp: , Rfl:    sildenafil (VIAGRA) 100 MG tablet, Take 100 mg by mouth daily as needed for erectile dysfunction., Disp: , Rfl:    VITAMIN D PO, Take 1 capsule by mouth daily., Disp: , Rfl:    warfarin (COUMADIN) 5 MG tablet, Take 2.5-5 mg by mouth See admin instructions. Or as directed by coumadin clinic Take 5 mg on Sun, Tues,Thurs, Sat. Take 2.5 mg on Mon, Wed, and Fri, Disp: , Rfl:  Past Medical History:  Diagnosis Date   Allergy    Atrial fibrillation ([place])    Heart murmur    Hyperlipidemia    Hypertension    Melanoma ([place])    MVP (mitral valve prolapse)    Prostate cancer (HCC)    PVC (premature ventricular contraction)    Stroke [facility])     ASSESSMENT  Recent Results: The most recent  result is correlated with 27.5 mg per week:  Lab Results  Component Value Date   INR 3.8 (A) 02/10/2021   INR 2.6 01/26/2021   INR 2.3 01/19/2021    Anticoagulation Dosing:   INR today: Supratherapeutic. Previously stable ad controlled on weekly dose of 27.5 mg/week. Denies any complains of bleeding or bruising symptoms. Denies any other relevant changes in her diet, medications, or lifestyle. Will have pt hold today and continue previous weekly dose and close monitoring   PLAN Weekly dose was unchanged by  0 % to 27.5 mg per week. HOLD today and then continue current weekly dose of 2.5 mg every Mon, Wed, Fri and 5 mg all other days. Recheck INR in 1 weeks.   Patient Instructions  INR above goal. HOLD today and then continue current weekly dose of 2.5 mg every  Mon, Wed, Fri and 5 mg all other days. Recheck INR in 1 week.  Patient advised to contact clinic or seek medical attention if signs/symptoms of bleeding or thromboembolism occur.  Patient verbalized understanding by repeating back information and was advised to contact me if further medication-related questions arise.   Follow-up Return in about 6 days (around 02/16/2021).  [name], PharmD  15 minutes spent face-to-face with the patient during the encounter. 50% of time spent on education, including signs/sx bleeding and clotting, as well as food and drug interactions with warfarin. 50% of time was spent on fingerprick POC INR sample collection,processing, results determination, and documentation

## 2021-02-16 ENCOUNTER — Ambulatory Visit: Payer: Self-pay | Admitting: Pharmacist

## 2021-02-16 DIAGNOSIS — Z7901 Long term (current) use of anticoagulants: Secondary | ICD-10-CM

## 2021-02-16 DIAGNOSIS — Z5181 Encounter for therapeutic drug level monitoring: Secondary | ICD-10-CM | POA: Diagnosis not present

## 2021-02-16 DIAGNOSIS — I480 Paroxysmal atrial fibrillation: Secondary | ICD-10-CM

## 2021-02-16 DIAGNOSIS — Z9889 Other specified postprocedural states: Secondary | ICD-10-CM

## 2021-02-16 LAB — POCT INR: INR: 3 (ref 2.0–3.0)

## 2021-02-16 NOTE — Patient Instructions (Signed)
INR at goal. Continue current weekly dose of 2.5 mg every Mon, Wed, Fri and 5 mg all other days. Recheck INR in 1 week.

## 2021-02-16 NOTE — Progress Notes (Signed)
Anticoagulation Management Robert Lynn is a 71 y.o. male who reports to the clinic for monitoring of warfarin treatment.    Indication:  Paroxysmal Atrial Fibrillation, H/o mitral valve repair  CHA2DS2 Vasc Score 4 (Age 56-74, HTN hx, Hx of stroke), HAS-BLED 2 (Age>65, Stroke hx)  Duration: indefinite Supervising physician: Rex Kras  Anticoagulation Clinic Visit History:  Patient does not report signs/symptoms of bleeding or thromboembolism   Other recent changes: No change in diet, medications, lifestyle  Continues to do well without any noted changes. Maintains Vit K intake of 3-4 serving/week.   No noted changes. Sinus improved since.   Anticoagulation Episode Summary     Current INR goal:  2.0-3.0  TTR:  63.2 % (7.6 mo)  Next INR check:  02/23/2021  INR from last check:  3.0 (02/16/2021)  Weekly max warfarin dose:    Target end date:  Indefinite  INR check location:    Preferred lab:    Send INR reminders to:     Indications   H/O mitral valve repair [Z98.890] Monitoring for long-term anticoagulant use [Z51.81 Z79.01] Paroxysmal atrial fibrillation (HCC) [I48.0]        Comments:           No Known Allergies  Current Outpatient Medications:    acetaminophen (TYLENOL) 500 MG tablet, Take 1,000 mg by mouth every 6 (six) hours as needed for moderate pain or headache., Disp: , Rfl:    amLODipine (NORVASC) 5 MG tablet, Take 5 mg by mouth daily., Disp: , Rfl:    beclomethasone (QVAR) 80 MCG/ACT inhaler, Inhale 1 puff into the lungs daily., Disp: , Rfl:    ECHINACEA PO, Take 2 tablets by mouth 4 (four) times daily as needed (cold symptoms)., Disp: , Rfl:    fluticasone (FLONASE) 50 MCG/ACT nasal spray, Place 1 spray into both nostrils daily., Disp: , Rfl:    hydrochlorothiazide (HYDRODIURIL) 25 MG tablet, Take 25 mg by mouth daily., Disp: , Rfl:    KLOR-CON M20 20 MEQ tablet, Take 20 mEq by mouth daily., Disp: , Rfl:    loratadine (CLARITIN) 10 MG tablet, Take 10  mg by mouth daily as needed for allergies., Disp: , Rfl:    metoprolol succinate (TOPROL XL) 25 MG 24 hr tablet, Take 1 tablet (25 mg total) by mouth daily., Disp: 90 tablet, Rfl: 3   Multiple Vitamins-Minerals (ZINC PO), Take 1 tablet by mouth daily., Disp: , Rfl:    omeprazole (PRILOSEC) 20 MG capsule, Take 40 mg by mouth daily., Disp: , Rfl:    rosuvastatin (CRESTOR) 10 MG tablet, Take 10 mg by mouth daily., Disp: , Rfl:    sildenafil (VIAGRA) 100 MG tablet, Take 100 mg by mouth daily as needed for erectile dysfunction., Disp: , Rfl:    VITAMIN D PO, Take 1 capsule by mouth daily., Disp: , Rfl:    warfarin (COUMADIN) 5 MG tablet, Take 2.5-5 mg by mouth See admin instructions. Or as directed by coumadin clinic Take 5 mg on Sun, Tues,Thurs, Sat. Take 2.5 mg on Mon, Wed, and Fri, Disp: , Rfl:  Past Medical History:  Diagnosis Date   Allergy    Atrial fibrillation ([place])    Heart murmur    Hyperlipidemia    Hypertension    Melanoma ([place])    MVP (mitral valve prolapse)    Prostate cancer (HCC)    PVC (premature ventricular contraction)    Stroke [facility])     ASSESSMENT  Recent Results: The most recent result  is correlated with 27.5 mg per week:  Lab Results  Component Value Date   INR 3.0 02/16/2021   INR 3.8 (A) 02/10/2021   INR 2.6 01/26/2021    Anticoagulation Dosing:   INR today: Therapeutic. Stable and controlled on weekly dose of 27.5 mg/week. Denies any complains of bleeding or bruising symptoms. Denies any other relevant changes in her diet, medications, or lifestyle. Will continue current weekly dose and continue close monitoring and follow up asneeded   PLAN Weekly dose was unchanged by  0 % to 27.5 mg per week. HOLD today and then continue current weekly dose of 2.5 mg every Mon, Wed, Fri and 5 mg all other days. Recheck INR in 1 weeks.   Patient Instructions  INR at goal. Continue current weekly dose of 2.5 mg every Mon, Wed, Fri and 5 mg all other days. Recheck INR in  1 week.  Patient advised to contact clinic or seek medical attention if signs/symptoms of bleeding or thromboembolism occur.  Patient verbalized understanding by repeating back information and was advised to contact me if further medication-related questions arise.   Follow-up Return in about 1 week (around 02/23/2021).  [name], PharmD  15 minutes spent face-to-face with the patient during the encounter. 50% of time spent on education, including signs/sx bleeding and clotting, as well as food and drug interactions with warfarin. 50% of time was spent on fingerprick POC INR sample collection,processing, results determination, and documentation

## 2021-02-23 ENCOUNTER — Ambulatory Visit: Payer: Self-pay | Admitting: Pharmacist

## 2021-02-23 DIAGNOSIS — Z7901 Long term (current) use of anticoagulants: Secondary | ICD-10-CM

## 2021-02-23 DIAGNOSIS — I480 Paroxysmal atrial fibrillation: Secondary | ICD-10-CM

## 2021-02-23 DIAGNOSIS — Z9889 Other specified postprocedural states: Secondary | ICD-10-CM | POA: Diagnosis not present

## 2021-02-23 DIAGNOSIS — Z5181 Encounter for therapeutic drug level monitoring: Secondary | ICD-10-CM

## 2021-02-23 LAB — POCT INR: INR: 2.6 (ref 2.0–3.0)

## 2021-02-23 NOTE — Progress Notes (Signed)
Anticoagulation Management Robert Lynn is a 71 y.o. male who reports to the clinic for monitoring of warfarin treatment.    Indication:  Paroxysmal Atrial Fibrillation, H/o mitral valve repair  CHA2DS2 Vasc Score 4 (Age 11-74, HTN hx, Hx of stroke), HAS-BLED 2 (Age>65, Stroke hx)  Duration: indefinite Supervising physician: Rex Kras  Anticoagulation Clinic Visit History:  Patient does not report signs/symptoms of bleeding or thromboembolism   Other recent changes: No change in diet, medications, lifestyle  Continues to do well without any noted changes. Maintains Vit K intake of 3-4 serving/week.   No noted changes.  Anticoagulation Episode Summary     Current INR goal:  2.0-3.0  TTR:  64.3 % (7.8 mo)  Next INR check:  03/02/2021  INR from last check:  2.6 (02/23/2021)  Weekly max warfarin dose:    Target end date:  Indefinite  INR check location:    Preferred lab:    Send INR reminders to:     Indications   H/O mitral valve repair [Z98.890] Monitoring for long-term anticoagulant use [Z51.81 Z79.01] Paroxysmal atrial fibrillation (HCC) [I48.0]        Comments:           No Known Allergies  Current Outpatient Medications:    acetaminophen (TYLENOL) 500 MG tablet, Take 1,000 mg by mouth every 6 (six) hours as needed for moderate pain or headache., Disp: , Rfl:    amLODipine (NORVASC) 5 MG tablet, Take 5 mg by mouth daily., Disp: , Rfl:    beclomethasone (QVAR) 80 MCG/ACT inhaler, Inhale 1 puff into the lungs daily., Disp: , Rfl:    ECHINACEA PO, Take 2 tablets by mouth 4 (four) times daily as needed (cold symptoms)., Disp: , Rfl:    fluticasone (FLONASE) 50 MCG/ACT nasal spray, Place 1 spray into both nostrils daily., Disp: , Rfl:    hydrochlorothiazide (HYDRODIURIL) 25 MG tablet, Take 25 mg by mouth daily., Disp: , Rfl:    KLOR-CON M20 20 MEQ tablet, Take 20 mEq by mouth daily., Disp: , Rfl:    loratadine (CLARITIN) 10 MG tablet, Take 10 mg by mouth daily as  needed for allergies., Disp: , Rfl:    metoprolol succinate (TOPROL XL) 25 MG 24 hr tablet, Take 1 tablet (25 mg total) by mouth daily., Disp: 90 tablet, Rfl: 3   Multiple Vitamins-Minerals (ZINC PO), Take 1 tablet by mouth daily., Disp: , Rfl:    omeprazole (PRILOSEC) 20 MG capsule, Take 40 mg by mouth daily., Disp: , Rfl:    rosuvastatin (CRESTOR) 10 MG tablet, Take 10 mg by mouth daily., Disp: , Rfl:    sildenafil (VIAGRA) 100 MG tablet, Take 100 mg by mouth daily as needed for erectile dysfunction., Disp: , Rfl:    VITAMIN D PO, Take 1 capsule by mouth daily., Disp: , Rfl:    warfarin (COUMADIN) 5 MG tablet, Take 2.5-5 mg by mouth See admin instructions. Or as directed by coumadin clinic Take 5 mg on Sun, Tues,Thurs, Sat. Take 2.5 mg on Mon, Wed, and Fri, Disp: , Rfl:  Past Medical History:  Diagnosis Date   Allergy    Atrial fibrillation ([place])    Heart murmur    Hyperlipidemia    Hypertension    Melanoma ([place])    MVP (mitral valve prolapse)    Prostate cancer (HCC)    PVC (premature ventricular contraction)    Stroke [facility])     ASSESSMENT  Recent Results: The most recent result is correlated with 27.5  mg per week:  Lab Results  Component Value Date   INR 2.6 02/23/2021   INR 3.0 02/16/2021   INR 3.8 (A) 02/10/2021    Anticoagulation Dosing:   INR today: Therapeutic. Stable and controlled on weekly dose of 27.5 mg/week. Denies any complains of bleeding or bruising symptoms. Denies any other relevant changes in her diet, medications, or lifestyle. Will continue current weekly dose and continue close monitoring and follow up asneeded   PLAN Weekly dose was unchanged by  0 % to 27.5 mg per week. Continue current weekly dose of 2.5 mg every Mon, Wed, Fri and 5 mg all other days. Recheck INR in 1 weeks.   Patient Instructions  INR at goal. Continue current weekly dose of 2.5 mg every Mon, Wed, Fri and 5 mg all other days. Recheck INR in 1 week.  Patient advised to contact  clinic or seek medical attention if signs/symptoms of bleeding or thromboembolism occur.  Patient verbalized understanding by repeating back information and was advised to contact me if further medication-related questions arise.   Follow-up Return in about 1 week (around 03/02/2021).  [name], PharmD  15 minutes spent face-to-face with the patient during the encounter. 50% of time spent on education, including signs/sx bleeding and clotting, as well as food and drug interactions with warfarin. 50% of time was spent on fingerprick POC INR sample collection,processing, results determination, and documentation

## 2021-02-23 NOTE — Patient Instructions (Signed)
INR at goal. Continue current weekly dose of 2.5 mg every Mon, Wed, Fri and 5 mg all other days. Recheck INR in 1 week.

## 2021-02-25 DIAGNOSIS — I10 Essential (primary) hypertension: Secondary | ICD-10-CM | POA: Diagnosis not present

## 2021-02-25 DIAGNOSIS — E785 Hyperlipidemia, unspecified: Secondary | ICD-10-CM | POA: Diagnosis not present

## 2021-02-25 DIAGNOSIS — Z0000 Encounter for general adult medical examination without abnormal findings: Secondary | ICD-10-CM | POA: Diagnosis not present

## 2021-03-02 ENCOUNTER — Ambulatory Visit: Payer: Self-pay | Admitting: Pharmacist

## 2021-03-02 DIAGNOSIS — Z7901 Long term (current) use of anticoagulants: Secondary | ICD-10-CM | POA: Diagnosis not present

## 2021-03-02 DIAGNOSIS — I480 Paroxysmal atrial fibrillation: Secondary | ICD-10-CM

## 2021-03-02 DIAGNOSIS — Z5181 Encounter for therapeutic drug level monitoring: Secondary | ICD-10-CM

## 2021-03-02 DIAGNOSIS — Z9889 Other specified postprocedural states: Secondary | ICD-10-CM

## 2021-03-02 LAB — POCT INR: INR: 2.6 (ref 2.0–3.0)

## 2021-03-02 NOTE — Patient Instructions (Signed)
INR at goal. Continue current weekly dose of 2.5 mg every Mon, Wed, Fri and 5 mg all other days. Recheck INR in 1 week.

## 2021-03-02 NOTE — Progress Notes (Signed)
Anticoagulation Management Robert Lynn is a 71 y.o. male who reports to the clinic for monitoring of warfarin treatment.    Indication:  Paroxysmal Atrial Fibrillation, H/o mitral valve repair  CHA2DS2 Vasc Score 4 (Age 54-74, HTN hx, Hx of stroke), HAS-BLED 2 (Age>65, Stroke hx)  Duration: indefinite Supervising physician: Rex Kras  Anticoagulation Clinic Visit History:  Patient does not report signs/symptoms of bleeding or thromboembolism   Other recent changes: No change in diet, medications, lifestyle  Continues to do well without any noted changes. Maintains Vit K intake of 3-4 serving/week.   No noted changes.  Anticoagulation Episode Summary     Current INR goal:  2.0-3.0  TTR:  65.4 % (8.1 mo)  Next INR check:  03/09/2021  INR from last check:  2.6 (03/02/2021)  Weekly max warfarin dose:    Target end date:  Indefinite  INR check location:    Preferred lab:    Send INR reminders to:     Indications   H/O mitral valve repair [Z98.890] Monitoring for long-term anticoagulant use [Z51.81 Z79.01] Paroxysmal atrial fibrillation (HCC) [I48.0]        Comments:           No Known Allergies  Current Outpatient Medications:    acetaminophen (TYLENOL) 500 MG tablet, Take 1,000 mg by mouth every 6 (six) hours as needed for moderate pain or headache., Disp: , Rfl:    amLODipine (NORVASC) 5 MG tablet, Take 5 mg by mouth daily., Disp: , Rfl:    beclomethasone (QVAR) 80 MCG/ACT inhaler, Inhale 1 puff into the lungs daily., Disp: , Rfl:    ECHINACEA PO, Take 2 tablets by mouth 4 (four) times daily as needed (cold symptoms)., Disp: , Rfl:    fluticasone (FLONASE) 50 MCG/ACT nasal spray, Place 1 spray into both nostrils daily., Disp: , Rfl:    hydrochlorothiazide (HYDRODIURIL) 25 MG tablet, Take 25 mg by mouth daily., Disp: , Rfl:    KLOR-CON M20 20 MEQ tablet, Take 20 mEq by mouth daily., Disp: , Rfl:    loratadine (CLARITIN) 10 MG tablet, Take 10 mg by mouth daily as  needed for allergies., Disp: , Rfl:    metoprolol succinate (TOPROL XL) 25 MG 24 hr tablet, Take 1 tablet (25 mg total) by mouth daily., Disp: 90 tablet, Rfl: 3   Multiple Vitamins-Minerals (ZINC PO), Take 1 tablet by mouth daily., Disp: , Rfl:    omeprazole (PRILOSEC) 20 MG capsule, Take 40 mg by mouth daily., Disp: , Rfl:    rosuvastatin (CRESTOR) 10 MG tablet, Take 10 mg by mouth daily., Disp: , Rfl:    sildenafil (VIAGRA) 100 MG tablet, Take 100 mg by mouth daily as needed for erectile dysfunction., Disp: , Rfl:    VITAMIN D PO, Take 1 capsule by mouth daily., Disp: , Rfl:    warfarin (COUMADIN) 5 MG tablet, Take 2.5-5 mg by mouth See admin instructions. Or as directed by coumadin clinic Take 5 mg on Sun, Tues,Thurs, Sat. Take 2.5 mg on Mon, Wed, and Fri, Disp: , Rfl:  Past Medical History:  Diagnosis Date   Allergy    Atrial fibrillation ([place])    Heart murmur    Hyperlipidemia    Hypertension    Melanoma ([place])    MVP (mitral valve prolapse)    Prostate cancer (HCC)    PVC (premature ventricular contraction)    Stroke [facility])     ASSESSMENT  Recent Results: The most recent result is correlated with 27.5  mg per week:  Lab Results  Component Value Date   INR 2.6 03/02/2021   INR 2.6 02/23/2021   INR 3.0 02/16/2021    Anticoagulation Dosing:   INR today: Therapeutic. Stable and controlled on weekly dose of 27.5 mg/week. Denies any complains of bleeding or bruising symptoms. Denies any other relevant changes in her diet, medications, or lifestyle. Will continue current weekly dose and continue close monitoring and follow up asneeded   PLAN Weekly dose was unchanged by  0 % to 27.5 mg per week. Continue current weekly dose of 2.5 mg every Mon, Wed, Fri and 5 mg all other days. Recheck INR in 1 weeks.   Patient Instructions  INR at goal. Continue current weekly dose of 2.5 mg every Mon, Wed, Fri and 5 mg all other days. Recheck INR in 1 week.  Patient advised to contact clinic  or seek medical attention if signs/symptoms of bleeding or thromboembolism occur.  Patient verbalized understanding by repeating back information and was advised to contact me if further medication-related questions arise.   Follow-up Return in about 1 week (around 03/09/2021).  Robert Lynn, PharmD  15 minutes spent face-to-face with the patient during the encounter. 50% of time spent on education, including signs/sx bleeding and clotting, as well as food and drug interactions with warfarin. 50% of time was spent on fingerprick POC INR sample collection,processing, results determination, and documentation

## 2021-03-03 ENCOUNTER — Other Ambulatory Visit: Payer: Self-pay

## 2021-03-09 ENCOUNTER — Ambulatory Visit: Payer: Self-pay | Admitting: Pharmacist

## 2021-03-09 DIAGNOSIS — Z7901 Long term (current) use of anticoagulants: Secondary | ICD-10-CM | POA: Diagnosis not present

## 2021-03-09 DIAGNOSIS — Z9889 Other specified postprocedural states: Secondary | ICD-10-CM

## 2021-03-09 DIAGNOSIS — I480 Paroxysmal atrial fibrillation: Secondary | ICD-10-CM

## 2021-03-09 DIAGNOSIS — Z5181 Encounter for therapeutic drug level monitoring: Secondary | ICD-10-CM | POA: Diagnosis not present

## 2021-03-09 LAB — POCT INR: INR: 2.5 (ref 2.0–3.0)

## 2021-03-09 NOTE — Patient Instructions (Signed)
INR at goal. Continue current weekly dose of 2.5 mg every Mon, Wed, Fri and 5 mg all other days. Recheck INR in 1 week.

## 2021-03-09 NOTE — Progress Notes (Signed)
Anticoagulation Management Robert Lynn is a 71 y.o. male who reports to the clinic for monitoring of warfarin treatment.    Indication:  Paroxysmal Atrial Fibrillation, H/o mitral valve repair  CHA2DS2 Vasc Score 4 (Age 26-74, HTN hx, Hx of stroke), HAS-BLED 2 (Age>65, Stroke hx)  Duration: indefinite Supervising physician: Rex Kras  Anticoagulation Clinic Visit History:  Patient does not report signs/symptoms of bleeding or thromboembolism   Other recent changes: No change in diet, medications, lifestyle  Continues to do well without any noted changes. Maintains Vit K intake of 3-4 serving/week.   No noted changes.  Anticoagulation Episode Summary     Current INR goal:  2.0-3.0  TTR:  66.4 % (8.3 mo)  Next INR check:  03/16/2021  INR from last check:  2.5 (03/09/2021)  Weekly max warfarin dose:    Target end date:  Indefinite  INR check location:    Preferred lab:    Send INR reminders to:     Indications   H/O mitral valve repair [Z98.890] Monitoring for long-term anticoagulant use [Z51.81 Z79.01] Paroxysmal atrial fibrillation (HCC) [I48.0]        Comments:           No Known Allergies  Current Outpatient Medications:    acetaminophen (TYLENOL) 500 MG tablet, Take 1,000 mg by mouth every 6 (six) hours as needed for moderate pain or headache., Disp: , Rfl:    amLODipine (NORVASC) 5 MG tablet, Take 5 mg by mouth daily., Disp: , Rfl:    beclomethasone (QVAR) 80 MCG/ACT inhaler, Inhale 1 puff into the lungs daily., Disp: , Rfl:    ECHINACEA PO, Take 2 tablets by mouth 4 (four) times daily as needed (cold symptoms)., Disp: , Rfl:    fluticasone (FLONASE) 50 MCG/ACT nasal spray, Place 1 spray into both nostrils daily., Disp: , Rfl:    hydrochlorothiazide (HYDRODIURIL) 25 MG tablet, Take 25 mg by mouth daily., Disp: , Rfl:    KLOR-CON M20 20 MEQ tablet, Take 20 mEq by mouth daily., Disp: , Rfl:    loratadine (CLARITIN) 10 MG tablet, Take 10 mg by mouth daily as  needed for allergies., Disp: , Rfl:    metoprolol succinate (TOPROL XL) 25 MG 24 hr tablet, Take 1 tablet (25 mg total) by mouth daily., Disp: 90 tablet, Rfl: 3   Multiple Vitamins-Minerals (ZINC PO), Take 1 tablet by mouth daily., Disp: , Rfl:    omeprazole (PRILOSEC) 20 MG capsule, Take 40 mg by mouth daily., Disp: , Rfl:    rosuvastatin (CRESTOR) 10 MG tablet, Take 10 mg by mouth daily., Disp: , Rfl:    sildenafil (VIAGRA) 100 MG tablet, Take 100 mg by mouth daily as needed for erectile dysfunction., Disp: , Rfl:    VITAMIN D PO, Take 1 capsule by mouth daily., Disp: , Rfl:    warfarin (COUMADIN) 5 MG tablet, Take 2.5-5 mg by mouth See admin instructions. Or as directed by coumadin clinic Take 5 mg on Sun, Tues,Thurs, Sat. Take 2.5 mg on Mon, Wed, and Fri, Disp: , Rfl:  Past Medical History:  Diagnosis Date   Allergy    Atrial fibrillation ([place])    Heart murmur    Hyperlipidemia    Hypertension    Melanoma ([place])    MVP (mitral valve prolapse)    Prostate cancer (HCC)    PVC (premature ventricular contraction)    Stroke [facility])     ASSESSMENT  Recent Results: The most recent result is correlated with 27.5  mg per week:  Lab Results  Component Value Date   INR 2.5 03/09/2021   INR 2.6 03/02/2021   INR 2.6 02/23/2021    Anticoagulation Dosing:   INR today: Therapeutic. Stable and controlled on weekly dose of 27.5 mg/week. Denies any complains of bleeding or bruising symptoms. Denies any other relevant changes in her diet, medications, or lifestyle. Will continue current weekly dose and continue close monitoring and follow up asneeded   PLAN Weekly dose was unchanged by  0 % to 27.5 mg per week. Continue current weekly dose of 2.5 mg every Mon, Wed, Fri and 5 mg all other days. Recheck INR in 1 weeks.   Patient Instructions  INR at goal. Continue current weekly dose of 2.5 mg every Mon, Wed, Fri and 5 mg all other days. Recheck INR in 1 week.  Patient advised to contact clinic  or seek medical attention if signs/symptoms of bleeding or thromboembolism occur.  Patient verbalized understanding by repeating back information and was advised to contact me if further medication-related questions arise.   Follow-up Return in about 1 week (around 03/16/2021).  [name], PharmD  15 minutes spent face-to-face with the patient during the encounter. 50% of time spent on education, including signs/sx bleeding and clotting, as well as food and drug interactions with warfarin. 50% of time was spent on fingerprick POC INR sample collection,processing, results determination, and documentation

## 2021-03-16 ENCOUNTER — Ambulatory Visit: Payer: Self-pay | Admitting: Pharmacist

## 2021-03-16 DIAGNOSIS — Z5181 Encounter for therapeutic drug level monitoring: Secondary | ICD-10-CM

## 2021-03-16 DIAGNOSIS — Z9889 Other specified postprocedural states: Secondary | ICD-10-CM

## 2021-03-16 DIAGNOSIS — I480 Paroxysmal atrial fibrillation: Secondary | ICD-10-CM

## 2021-03-16 DIAGNOSIS — Z7901 Long term (current) use of anticoagulants: Secondary | ICD-10-CM

## 2021-03-16 LAB — POCT INR: INR: 2.3 (ref 2.0–3.0)

## 2021-03-16 NOTE — Progress Notes (Signed)
Anticoagulation Management Robert Lynn is a 71 y.o. male who reports to the clinic for monitoring of warfarin treatment.    Indication:  Paroxysmal Atrial Fibrillation, H/o mitral valve repair  CHA2DS2 Vasc Score 4 (Age 27-74, HTN hx, Hx of stroke), HAS-BLED 2 (Age>65, Stroke hx)  Duration: indefinite Supervising physician: Rex Kras  Anticoagulation Clinic Visit History:  Patient does not report signs/symptoms of bleeding or thromboembolism   Other recent changes: No change in diet, medications, lifestyle  Continues to do well without any noted changes. Maintains Vit K intake of 3-4 serving/week.   No noted changes.  Anticoagulation Episode Summary     Current INR goal:  2.0-3.0  TTR:  67.2 % (8.5 mo)  Next INR check:  03/30/2021  INR from last check:  2.3 (03/16/2021)  Weekly max warfarin dose:    Target end date:  Indefinite  INR check location:    Preferred lab:    Send INR reminders to:     Indications   H/O mitral valve repair [Z98.890] Monitoring for long-term anticoagulant use [Z51.81 Z79.01] Paroxysmal atrial fibrillation (HCC) [I48.0]        Comments:           No Known Allergies  Current Outpatient Medications:    acetaminophen (TYLENOL) 500 MG tablet, Take 1,000 mg by mouth every 6 (six) hours as needed for moderate pain or headache., Disp: , Rfl:    amLODipine (NORVASC) 5 MG tablet, Take 5 mg by mouth daily., Disp: , Rfl:    beclomethasone (QVAR) 80 MCG/ACT inhaler, Inhale 1 puff into the lungs daily., Disp: , Rfl:    ECHINACEA PO, Take 2 tablets by mouth 4 (four) times daily as needed (cold symptoms)., Disp: , Rfl:    fluticasone (FLONASE) 50 MCG/ACT nasal spray, Place 1 spray into both nostrils daily., Disp: , Rfl:    hydrochlorothiazide (HYDRODIURIL) 25 MG tablet, Take 25 mg by mouth daily., Disp: , Rfl:    KLOR-CON M20 20 MEQ tablet, Take 20 mEq by mouth daily., Disp: , Rfl:    loratadine (CLARITIN) 10 MG tablet, Take 10 mg by mouth daily as  needed for allergies., Disp: , Rfl:    metoprolol succinate (TOPROL XL) 25 MG 24 hr tablet, Take 1 tablet (25 mg total) by mouth daily., Disp: 90 tablet, Rfl: 3   Multiple Vitamins-Minerals (ZINC PO), Take 1 tablet by mouth daily., Disp: , Rfl:    omeprazole (PRILOSEC) 20 MG capsule, Take 40 mg by mouth daily., Disp: , Rfl:    rosuvastatin (CRESTOR) 10 MG tablet, Take 10 mg by mouth daily., Disp: , Rfl:    sildenafil (VIAGRA) 100 MG tablet, Take 100 mg by mouth daily as needed for erectile dysfunction., Disp: , Rfl:    VITAMIN D PO, Take 1 capsule by mouth daily., Disp: , Rfl:    warfarin (COUMADIN) 5 MG tablet, Take 2.5-5 mg by mouth See admin instructions. Or as directed by coumadin clinic Take 5 mg on Sun, Tues,Thurs, Sat. Take 2.5 mg on Mon, Wed, and Fri, Disp: , Rfl:  Past Medical History:  Diagnosis Date   Allergy    Atrial fibrillation ([place])    Heart murmur    Hyperlipidemia    Hypertension    Melanoma ([place])    MVP (mitral valve prolapse)    Prostate cancer (HCC)    PVC (premature ventricular contraction)    Stroke [facility])     ASSESSMENT  Recent Results: The most recent result is correlated with 27.5  mg per week:  Lab Results  Component Value Date   INR 2.3 03/16/2021   INR 2.5 03/09/2021   INR 2.6 03/02/2021    Anticoagulation Dosing:   INR today: Therapeutic. Stable and controlled on weekly dose of 27.5 mg/week. Denies any complains of bleeding or bruising symptoms. Denies any other relevant changes in her diet, medications, or lifestyle. Will continue current weekly dose and continue close monitoring and follow up asneeded   PLAN Weekly dose was unchanged by  0 % to 27.5 mg per week. Continue current weekly dose of 2.5 mg every Mon, Wed, Fri and 5 mg all other days. Recheck INR in 1 weeks.   Patient Instructions  INR at goal. Continue current weekly dose of 2.5 mg every Mon, Wed, Fri and 5 mg all other days. Recheck INR in 1 week.  Patient advised to contact clinic  or seek medical attention if signs/symptoms of bleeding or thromboembolism occur.  Patient verbalized understanding by repeating back information and was advised to contact me if further medication-related questions arise.   Follow-up Return in about 1 week (around 03/23/2021).  [name], PharmD  15 minutes spent face-to-face with the patient during the encounter. 50% of time spent on education, including signs/sx bleeding and clotting, as well as food and drug interactions with warfarin. 50% of time was spent on fingerprick POC INR sample collection,processing, results determination, and documentation

## 2021-03-16 NOTE — Patient Instructions (Signed)
INR at goal. Continue current weekly dose of 2.5 mg every Mon, Wed, Fri and 5 mg all other days. Recheck INR in 1 week.

## 2021-03-23 ENCOUNTER — Ambulatory Visit: Payer: Self-pay | Admitting: Pharmacist

## 2021-03-23 DIAGNOSIS — Z9889 Other specified postprocedural states: Secondary | ICD-10-CM | POA: Diagnosis not present

## 2021-03-23 DIAGNOSIS — Z7901 Long term (current) use of anticoagulants: Secondary | ICD-10-CM

## 2021-03-23 DIAGNOSIS — I480 Paroxysmal atrial fibrillation: Secondary | ICD-10-CM

## 2021-03-23 DIAGNOSIS — Z5181 Encounter for therapeutic drug level monitoring: Secondary | ICD-10-CM | POA: Diagnosis not present

## 2021-03-23 LAB — POCT INR: INR: 2.7 (ref 2.0–3.0)

## 2021-03-23 NOTE — Patient Instructions (Signed)
INR at goal. Continue current weekly dose of 2.5 mg every Mon, Wed, Fri and 5 mg all other days. Recheck INR in 1 week.

## 2021-03-23 NOTE — Progress Notes (Signed)
Anticoagulation Management Robert Lynn is a 71 y.o. male who reports to the clinic for monitoring of warfarin treatment.    Indication:  Paroxysmal Atrial Fibrillation, H/o mitral valve repair  CHA2DS2 Vasc Score 4 (Age 22-74, HTN hx, Hx of stroke), HAS-BLED 2 (Age>65, Stroke hx)  Duration: indefinite Supervising physician: Rex Kras  Anticoagulation Clinic Visit History:  Patient does not report signs/symptoms of bleeding or thromboembolism   Other recent changes: No change in diet, medications, lifestyle  Continues to do well without any noted changes. Maintains Vit K intake of 3-4 serving/week.   No noted changes.  Anticoagulation Episode Summary     Current INR goal:  2.0-3.0  TTR:  68.1 % (8.8 mo)  Next INR check:  03/30/2021  INR from last check:  2.7 (03/23/2021)  Weekly max warfarin dose:    Target end date:  Indefinite  INR check location:    Preferred lab:    Send INR reminders to:     Indications   H/O mitral valve repair [Z98.890] Monitoring for long-term anticoagulant use [Z51.81 Z79.01] Paroxysmal atrial fibrillation (HCC) [I48.0]        Comments:           No Known Allergies  Current Outpatient Medications:    acetaminophen (TYLENOL) 500 MG tablet, Take 1,000 mg by mouth every 6 (six) hours as needed for moderate pain or headache., Disp: , Rfl:    amLODipine (NORVASC) 5 MG tablet, Take 5 mg by mouth daily., Disp: , Rfl:    beclomethasone (QVAR) 80 MCG/ACT inhaler, Inhale 1 puff into the lungs daily., Disp: , Rfl:    ECHINACEA PO, Take 2 tablets by mouth 4 (four) times daily as needed (cold symptoms)., Disp: , Rfl:    fluticasone (FLONASE) 50 MCG/ACT nasal spray, Place 1 spray into both nostrils daily., Disp: , Rfl:    hydrochlorothiazide (HYDRODIURIL) 25 MG tablet, Take 25 mg by mouth daily., Disp: , Rfl:    KLOR-CON M20 20 MEQ tablet, Take 20 mEq by mouth daily., Disp: , Rfl:    loratadine (CLARITIN) 10 MG tablet, Take 10 mg by mouth daily as  needed for allergies., Disp: , Rfl:    metoprolol succinate (TOPROL XL) 25 MG 24 hr tablet, Take 1 tablet (25 mg total) by mouth daily., Disp: 90 tablet, Rfl: 3   Multiple Vitamins-Minerals (ZINC PO), Take 1 tablet by mouth daily., Disp: , Rfl:    omeprazole (PRILOSEC) 20 MG capsule, Take 40 mg by mouth daily., Disp: , Rfl:    rosuvastatin (CRESTOR) 10 MG tablet, Take 10 mg by mouth daily., Disp: , Rfl:    sildenafil (VIAGRA) 100 MG tablet, Take 100 mg by mouth daily as needed for erectile dysfunction., Disp: , Rfl:    VITAMIN D PO, Take 1 capsule by mouth daily., Disp: , Rfl:    warfarin (COUMADIN) 5 MG tablet, Take 2.5-5 mg by mouth See admin instructions. Or as directed by coumadin clinic Take 5 mg on Sun, Tues,Thurs, Sat. Take 2.5 mg on Mon, Wed, and Fri, Disp: , Rfl:  Past Medical History:  Diagnosis Date   Allergy    Atrial fibrillation ([place])    Heart murmur    Hyperlipidemia    Hypertension    Melanoma ([place])    MVP (mitral valve prolapse)    Prostate cancer (HCC)    PVC (premature ventricular contraction)    Stroke [facility])     ASSESSMENT  Recent Results: The most recent result is correlated with 27.5  mg per week:  Lab Results  Component Value Date   INR 2.7 03/23/2021   INR 2.3 03/16/2021   INR 2.5 03/09/2021    Anticoagulation Dosing:   INR today: Therapeutic. Stable and controlled on weekly dose of 27.5 mg/week. Denies any complains of bleeding or bruising symptoms. Denies any other relevant changes in her diet, medications, or lifestyle. Will continue current weekly dose and continue close monitoring and follow up asneeded   PLAN Weekly dose was unchanged by  0 % to 27.5 mg per week. Continue current weekly dose of 2.5 mg every Mon, Wed, Fri and 5 mg all other days. Recheck INR in 1 weeks.   Patient Instructions  INR at goal. Continue current weekly dose of 2.5 mg every Mon, Wed, Fri and 5 mg all other days. Recheck INR in 1 week.  Patient advised to contact clinic  or seek medical attention if signs/symptoms of bleeding or thromboembolism occur.  Patient verbalized understanding by repeating back information and was advised to contact me if further medication-related questions arise.   Follow-up Return in about 1 week (around 03/30/2021).  [name], PharmD  15 minutes spent face-to-face with the patient during the encounter. 50% of time spent on education, including signs/sx bleeding and clotting, as well as food and drug interactions with warfarin. 50% of time was spent on fingerprick POC INR sample collection,processing, results determination, and documentation

## 2021-03-30 ENCOUNTER — Ambulatory Visit: Payer: Self-pay | Admitting: Pharmacist

## 2021-03-30 DIAGNOSIS — Z5181 Encounter for therapeutic drug level monitoring: Secondary | ICD-10-CM | POA: Diagnosis not present

## 2021-03-30 DIAGNOSIS — Z9889 Other specified postprocedural states: Secondary | ICD-10-CM

## 2021-03-30 DIAGNOSIS — Z7901 Long term (current) use of anticoagulants: Secondary | ICD-10-CM | POA: Diagnosis not present

## 2021-03-30 DIAGNOSIS — I480 Paroxysmal atrial fibrillation: Secondary | ICD-10-CM

## 2021-03-30 LAB — POCT INR: INR: 2.4 (ref 2.0–3.0)

## 2021-03-30 NOTE — Patient Instructions (Signed)
INR at goal. Continue current weekly dose of 2.5 mg every Mon, Wed, Fri and 5 mg all other days. Recheck INR in 1 week.

## 2021-03-30 NOTE — Progress Notes (Signed)
Anticoagulation Management Robert Lynn is a 71 y.o. male who reports to the clinic for monitoring of warfarin treatment.    Indication:  Paroxysmal Atrial Fibrillation, H/o mitral valve repair  CHA2DS2 Vasc Score 4 (Age 25-74, HTN hx, Hx of stroke), HAS-BLED 2 (Age>65, Stroke hx)  Duration: indefinite Supervising physician: Rex Kras  Anticoagulation Clinic Visit History:  Patient does not report signs/symptoms of bleeding or thromboembolism   Other recent changes: No change in diet, medications, lifestyle  Continues to do well without any noted changes. Maintains Vit K intake of 3-4 serving/week.   No noted changes.  Anticoagulation Episode Summary     Current INR goal:  2.0-3.0  TTR:  68.9 % (9 mo)  Next INR check:  04/06/2021  INR from last check:  2.4 (03/30/2021)  Weekly max warfarin dose:    Target end date:  Indefinite  INR check location:    Preferred lab:    Send INR reminders to:     Indications   H/O mitral valve repair [Z98.890] Monitoring for long-term anticoagulant use [Z51.81 Z79.01] Paroxysmal atrial fibrillation (HCC) [I48.0]        Comments:           No Known Allergies  Current Outpatient Medications:    acetaminophen (TYLENOL) 500 MG tablet, Take 1,000 mg by mouth every 6 (six) hours as needed for moderate pain or headache., Disp: , Rfl:    amLODipine (NORVASC) 5 MG tablet, Take 5 mg by mouth daily., Disp: , Rfl:    beclomethasone (QVAR) 80 MCG/ACT inhaler, Inhale 1 puff into the lungs daily., Disp: , Rfl:    ECHINACEA PO, Take 2 tablets by mouth 4 (four) times daily as needed (cold symptoms)., Disp: , Rfl:    fluticasone (FLONASE) 50 MCG/ACT nasal spray, Place 1 spray into both nostrils daily., Disp: , Rfl:    hydrochlorothiazide (HYDRODIURIL) 25 MG tablet, Take 25 mg by mouth daily., Disp: , Rfl:    KLOR-CON M20 20 MEQ tablet, Take 20 mEq by mouth daily., Disp: , Rfl:    loratadine (CLARITIN) 10 MG tablet, Take 10 mg by mouth daily as  needed for allergies., Disp: , Rfl:    metoprolol succinate (TOPROL XL) 25 MG 24 hr tablet, Take 1 tablet (25 mg total) by mouth daily., Disp: 90 tablet, Rfl: 3   Multiple Vitamins-Minerals (ZINC PO), Take 1 tablet by mouth daily., Disp: , Rfl:    omeprazole (PRILOSEC) 20 MG capsule, Take 40 mg by mouth daily., Disp: , Rfl:    rosuvastatin (CRESTOR) 10 MG tablet, Take 10 mg by mouth daily., Disp: , Rfl:    sildenafil (VIAGRA) 100 MG tablet, Take 100 mg by mouth daily as needed for erectile dysfunction., Disp: , Rfl:    VITAMIN D PO, Take 1 capsule by mouth daily., Disp: , Rfl:    warfarin (COUMADIN) 5 MG tablet, Take 2.5-5 mg by mouth See admin instructions. Or as directed by coumadin clinic Take 5 mg on Sun, Tues,Thurs, Sat. Take 2.5 mg on Mon, Wed, and Fri, Disp: , Rfl:  Past Medical History:  Diagnosis Date   Allergy    Atrial fibrillation ([place])    Heart murmur    Hyperlipidemia    Hypertension    Melanoma ([place])    MVP (mitral valve prolapse)    Prostate cancer (HCC)    PVC (premature ventricular contraction)    Stroke [facility])     ASSESSMENT  Recent Results: The most recent result is correlated with 27.5  mg per week:  Lab Results  Component Value Date   INR 2.4 03/30/2021   INR 2.7 03/23/2021   INR 2.3 03/16/2021    Anticoagulation Dosing:   INR today: Therapeutic. Stable and controlled on weekly dose of 27.5 mg/week. Denies any complains of bleeding or bruising symptoms. Denies any other relevant changes in her diet, medications, or lifestyle. Will continue current weekly dose and continue close monitoring and follow up asneeded   PLAN Weekly dose was unchanged by  0 % to 27.5 mg per week. Continue current weekly dose of 2.5 mg every Mon, Wed, Fri and 5 mg all other days. Recheck INR in 1 weeks.   Patient Instructions  INR at goal. Continue current weekly dose of 2.5 mg every Mon, Wed, Fri and 5 mg all other days. Recheck INR in 1 week.  Patient advised to contact clinic  or seek medical attention if signs/symptoms of bleeding or thromboembolism occur.  Patient verbalized understanding by repeating back information and was advised to contact me if further medication-related questions arise.   Follow-up Return in about 1 week (around 04/06/2021).  [name], PharmD  15 minutes spent face-to-face with the patient during the encounter. 50% of time spent on education, including signs/sx bleeding and clotting, as well as food and drug interactions with warfarin. 50% of time was spent on fingerprick POC INR sample collection,processing, results determination, and documentation

## 2021-04-06 ENCOUNTER — Ambulatory Visit: Payer: Self-pay | Admitting: Pharmacist

## 2021-04-06 DIAGNOSIS — Z9889 Other specified postprocedural states: Secondary | ICD-10-CM | POA: Diagnosis not present

## 2021-04-06 DIAGNOSIS — Z5181 Encounter for therapeutic drug level monitoring: Secondary | ICD-10-CM | POA: Diagnosis not present

## 2021-04-06 DIAGNOSIS — I480 Paroxysmal atrial fibrillation: Secondary | ICD-10-CM

## 2021-04-06 DIAGNOSIS — Z7901 Long term (current) use of anticoagulants: Secondary | ICD-10-CM | POA: Diagnosis not present

## 2021-04-06 LAB — POCT INR: INR: 1.8 — AB (ref 2.0–3.0)

## 2021-04-06 NOTE — Progress Notes (Signed)
Anticoagulation Management Robert Lynn is a 71 y.o. male who reports to the clinic for monitoring of warfarin treatment.    Indication:  Paroxysmal Atrial Fibrillation, H/o mitral valve repair  CHA2DS2 Vasc Score 4 (Age 6-74, HTN hx, Hx of stroke), HAS-BLED 2 (Age>65, Stroke hx)  Duration: indefinite Supervising physician: Rex Kras  Anticoagulation Clinic Visit History:  Patient does not report signs/symptoms of bleeding or thromboembolism   Other recent changes: No change in diet, medications, lifestyle  Continues to do well without any noted changes. Maintains Vit K intake of 3-4 serving/week.   Pt reports to have accidentally missed his dose Thursday.   Anticoagulation Episode Summary     Current INR goal:  2.0-3.0  TTR:  68.8 % (9.2 mo)  Next INR check:  04/13/2021  INR from last check:  1.8 (04/06/2021)  Weekly max warfarin dose:    Target end date:  Indefinite  INR check location:    Preferred lab:    Send INR reminders to:     Indications   H/O mitral valve repair [Z98.890] Monitoring for long-term anticoagulant use [Z51.81 Z79.01] Paroxysmal atrial fibrillation (HCC) [I48.0]        Comments:           No Known Allergies  Current Outpatient Medications:    acetaminophen (TYLENOL) 500 MG tablet, Take 1,000 mg by mouth every 6 (six) hours as needed for moderate pain or headache., Disp: , Rfl:    amLODipine (NORVASC) 5 MG tablet, Take 5 mg by mouth daily., Disp: , Rfl:    beclomethasone (QVAR) 80 MCG/ACT inhaler, Inhale 1 puff into the lungs daily., Disp: , Rfl:    ECHINACEA PO, Take 2 tablets by mouth 4 (four) times daily as needed (cold symptoms)., Disp: , Rfl:    fluticasone (FLONASE) 50 MCG/ACT nasal spray, Place 1 spray into both nostrils daily., Disp: , Rfl:    hydrochlorothiazide (HYDRODIURIL) 25 MG tablet, Take 25 mg by mouth daily., Disp: , Rfl:    KLOR-CON M20 20 MEQ tablet, Take 20 mEq by mouth daily., Disp: , Rfl:    loratadine (CLARITIN) 10  MG tablet, Take 10 mg by mouth daily as needed for allergies., Disp: , Rfl:    metoprolol succinate (TOPROL XL) 25 MG 24 hr tablet, Take 1 tablet (25 mg total) by mouth daily., Disp: 90 tablet, Rfl: 3   Multiple Vitamins-Minerals (ZINC PO), Take 1 tablet by mouth daily., Disp: , Rfl:    omeprazole (PRILOSEC) 20 MG capsule, Take 40 mg by mouth daily., Disp: , Rfl:    rosuvastatin (CRESTOR) 10 MG tablet, Take 10 mg by mouth daily., Disp: , Rfl:    sildenafil (VIAGRA) 100 MG tablet, Take 100 mg by mouth daily as needed for erectile dysfunction., Disp: , Rfl:    VITAMIN D PO, Take 1 capsule by mouth daily., Disp: , Rfl:    warfarin (COUMADIN) 5 MG tablet, Take 2.5-5 mg by mouth See admin instructions. Or as directed by coumadin clinic Take 5 mg on Sun, Tues,Thurs, Sat. Take 2.5 mg on Mon, Wed, and Fri, Disp: , Rfl:  Past Medical History:  Diagnosis Date   Allergy    Atrial fibrillation ([place])    Heart murmur    Hyperlipidemia    Hypertension    Melanoma ([place])    MVP (mitral valve prolapse)    Prostate cancer (HCC)    PVC (premature ventricular contraction)    Stroke [facility])     ASSESSMENT  Recent Results: The  most recent result is correlated with 27.5 mg per week:  Lab Results  Component Value Date   INR 1.8 (A) 04/06/2021   INR 2.4 03/30/2021   INR 2.7 03/23/2021    Anticoagulation Dosing:   INR today: Subtherapeutic. In setting of recent missed dose. Previously stable and controlled on weekly dose of 27.5 mg/week. Denies any complains of bleeding or bruising symptoms. Denies any other relevant changes in her diet, medications, or lifestyle. Will boost today and continue previously stable weekly dose and close monitoring and follow up to ensure that INR returns back within therapeutic range   PLAN Weekly dose was unchanged by  0 % to 27.5 mg per week. Continue current weekly dose of 2.5 mg every Mon, Wed, Fri and 5 mg all other days. Recheck INR in 1 weeks.   Patient Instructions   INR below goal. Take 5 mg today and then continue current weekly dose of 2.5 mg every Mon, Wed, Fri and 5 mg all other days. Recheck INR in 1 week.  Patient advised to contact clinic or seek medical attention if signs/symptoms of bleeding or thromboembolism occur.  Patient verbalized understanding by repeating back information and was advised to contact me if further medication-related questions arise.   Follow-up Return in about 1 week (around 04/13/2021).  [name], PharmD  15 minutes spent face-to-face with the patient during the encounter. 50% of time spent on education, including signs/sx bleeding and clotting, as well as food and drug interactions with warfarin. 50% of time was spent on fingerprick POC INR sample collection,processing, results determination, and documentation

## 2021-04-06 NOTE — Patient Instructions (Signed)
INR below goal. Take 5 mg today and then continue current weekly dose of 2.5 mg every Mon, Wed, Fri and 5 mg all other days. Recheck INR in 1 week.

## 2021-04-13 ENCOUNTER — Ambulatory Visit: Payer: Self-pay | Admitting: Pharmacist

## 2021-04-13 DIAGNOSIS — Z9889 Other specified postprocedural states: Secondary | ICD-10-CM | POA: Diagnosis not present

## 2021-04-13 DIAGNOSIS — Z7901 Long term (current) use of anticoagulants: Secondary | ICD-10-CM

## 2021-04-13 DIAGNOSIS — Z5181 Encounter for therapeutic drug level monitoring: Secondary | ICD-10-CM

## 2021-04-13 DIAGNOSIS — I480 Paroxysmal atrial fibrillation: Secondary | ICD-10-CM

## 2021-04-13 LAB — POCT INR: INR: 2.7 (ref 2.0–3.0)

## 2021-04-13 NOTE — Progress Notes (Signed)
Anticoagulation Management ?Robert Lynn is a 71 y.o. male who reports to the clinic for monitoring of warfarin treatment.   ? ?Indication:  Paroxysmal Atrial Fibrillation, H/o mitral valve repair  CHA2DS2 Vasc Score 4 (Age 54-74, HTN hx, Hx of stroke), HAS-BLED 2 (Age>65, Stroke hx)  ?Duration: indefinite ?Supervising physician: Rex Kras ? ?Anticoagulation Clinic Visit History: ? ?Patient does not report signs/symptoms of bleeding or thromboembolism  ? ?Other recent changes: No change in diet, medications, lifestyle ? ?Continues to do well without any noted changes. Maintains Vit K intake of 3-4 serving/week.  ? ?Anticoagulation Episode Summary   ? ? Current INR goal:  2.0-3.0  ?TTR:  69.1 % (9.5 mo)  ?Next INR check:  04/20/2021  ?INR from last check:  2.7 (04/13/2021)  ?Weekly max warfarin dose:    ?Target end date:  Indefinite  ?INR check location:    ?Preferred lab:    ?Send INR reminders to:    ? Indications   ?H/O mitral valve repair [Z98.890] ?Monitoring for long-term anticoagulant use [Z51.81 ?Z79.01] ?Paroxysmal atrial fibrillation (HCC) [I48.0] ? ?  ?  ? ? Comments:    ?  ? ?  ? ? ?No Known Allergies ? ?Current Outpatient Medications:  ?  acetaminophen (TYLENOL) 500 MG tablet, Take 1,000 mg by mouth every 6 (six) hours as needed for moderate pain or headache., Disp: , Rfl:  ?  amLODipine (NORVASC) 5 MG tablet, Take 5 mg by mouth daily., Disp: , Rfl:  ?  beclomethasone (QVAR) 80 MCG/ACT inhaler, Inhale 1 puff into the lungs daily., Disp: , Rfl:  ?  ECHINACEA PO, Take 2 tablets by mouth 4 (four) times daily as needed (cold symptoms)., Disp: , Rfl:  ?  fluticasone (FLONASE) 50 MCG/ACT nasal spray, Place 1 spray into both nostrils daily., Disp: , Rfl:  ?  hydrochlorothiazide (HYDRODIURIL) 25 MG tablet, Take 25 mg by mouth daily., Disp: , Rfl:  ?  KLOR-CON M20 20 MEQ tablet, Take 20 mEq by mouth daily., Disp: , Rfl:  ?  loratadine (CLARITIN) 10 MG tablet, Take 10 mg by mouth daily as needed for allergies.,  Disp: , Rfl:  ?  metoprolol succinate (TOPROL XL) 25 MG 24 hr tablet, Take 1 tablet (25 mg total) by mouth daily., Disp: 90 tablet, Rfl: 3 ?  Multiple Vitamins-Minerals (ZINC PO), Take 1 tablet by mouth daily., Disp: , Rfl:  ?  omeprazole (PRILOSEC) 20 MG capsule, Take 40 mg by mouth daily., Disp: , Rfl:  ?  rosuvastatin (CRESTOR) 10 MG tablet, Take 10 mg by mouth daily., Disp: , Rfl:  ?  sildenafil (VIAGRA) 100 MG tablet, Take 100 mg by mouth daily as needed for erectile dysfunction., Disp: , Rfl:  ?  VITAMIN D PO, Take 1 capsule by mouth daily., Disp: , Rfl:  ?  warfarin (COUMADIN) 5 MG tablet, Take 2.5-5 mg by mouth See admin instructions. Or as directed by coumadin clinic Take 5 mg on Sun, Tues,Thurs, Sat. Take 2.5 mg on Mon, Wed, and Fri, Disp: , Rfl:  ?Past Medical History:  ?Diagnosis Date  ? Allergy   ? Atrial fibrillation ([place])   ? Heart murmur   ? Hyperlipidemia   ? Hypertension   ? Melanoma ([place])   ? MVP (mitral valve prolapse)   ? Prostate cancer ([place])   ? PVC (premature ventricular contraction)   ? Stroke [facility])   ? ? ?ASSESSMENT ? ?Recent Results: ?The most recent result is correlated with 27.5 mg per week: ? ?  Lab Results  ?Component Value Date  ? INR 2.7 04/13/2021  ? INR 1.8 (A) 04/06/2021  ? INR 2.4 03/30/2021  ? ? ?Anticoagulation Dosing: ?  ?INR today: Therapeutic. Improved following returning back to previously stable weekly dose. Stable and controlled on weekly dose of 27.5 mg/week. Denies any complains of bleeding or bruising symptoms. Denies any other relevant changes in her diet, medications, or lifestyle. Will continue current weekly dose and continue close monitoring and follow up ? ?PLAN ?Weekly dose was unchanged by  0 % to 27.5 mg per week. Continue current weekly dose of 2.5 mg every Mon, Wed, Fri and 5 mg all other days. Recheck INR in 1 weeks.  ? ?Patient Instructions  ?INR at goal. Continue current weekly dose of 2.5 mg every Mon, Wed, Fri and 5 mg all other days. Recheck INR in 1  week.  ?Patient advised to contact clinic or seek medical attention if signs/symptoms of bleeding or thromboembolism occur. ? ?Patient verbalized understanding by repeating back information and was advised to contact me if further medication-related questions arise.  ? ?Follow-up ?Return in about 1 week (around 04/20/2021). ? ?[name], PharmD ? ?15 minutes spent face-to-face with the patient during the encounter. 50% of time spent on education, including signs/sx bleeding and clotting, as well as food and drug interactions with warfarin. 50% of time was spent on fingerprick POC INR sample collection,processing, results determination, and documentation ? ?

## 2021-04-13 NOTE — Patient Instructions (Signed)
INR at goal. Continue current weekly dose of 2.5 mg every Mon, Wed, Fri and 5 mg all other days. Recheck INR in 1 week.  ?

## 2021-04-21 ENCOUNTER — Ambulatory Visit: Payer: Self-pay | Admitting: Pharmacist

## 2021-04-21 DIAGNOSIS — Z5181 Encounter for therapeutic drug level monitoring: Secondary | ICD-10-CM

## 2021-04-21 DIAGNOSIS — I480 Paroxysmal atrial fibrillation: Secondary | ICD-10-CM

## 2021-04-21 DIAGNOSIS — Z9889 Other specified postprocedural states: Secondary | ICD-10-CM | POA: Diagnosis not present

## 2021-04-21 DIAGNOSIS — Z7901 Long term (current) use of anticoagulants: Secondary | ICD-10-CM | POA: Diagnosis not present

## 2021-04-21 LAB — POCT INR: INR: 3.3 — AB (ref 2.0–3.0)

## 2021-04-21 NOTE — Patient Instructions (Signed)
INR above goal. Take 2.5 mg today and then continue current weekly dose of 2.5 mg every Mon, Wed, Fri and 5 mg all other days. Recheck INR in 1 week.  ?

## 2021-04-21 NOTE — Progress Notes (Signed)
Anticoagulation Management ?Robert Lynn is a 71 y.o. male who reports to the clinic for monitoring of warfarin treatment.   ? ?Indication:  Paroxysmal Atrial Fibrillation, H/o mitral valve repair  CHA2DS2 Vasc Score 4 (Age 44-74, HTN hx, Hx of stroke), HAS-BLED 2 (Age>65, Stroke hx)  ?Duration: indefinite ?Supervising physician: Rex Kras ? ?Anticoagulation Clinic Visit History: ? ?Patient does not report signs/symptoms of bleeding or thromboembolism  ? ?Other recent changes: No change in diet, medications, lifestyle ? ?Continues to do well without any noted changes. Maintains Vit K intake of 3-4 serving/week. Pt reports that he hasnt been eating his regular Vit K intake over the past week. Planing on returning to previous baseline Vit K intake.  ? ?Anticoagulation Episode Summary   ? ? Current INR goal:  2.0-3.0  ?TTR:  68.5 % (9.7 mo)  ?Next INR check:  04/27/2021  ?INR from last check:  3.3 (04/21/2021)  ?Weekly max warfarin dose:    ?Target end date:  Indefinite  ?INR check location:    ?Preferred lab:    ?Send INR reminders to:    ? Indications   ?H/O mitral valve repair [Z98.890] ?Monitoring for long-term anticoagulant use [Z51.81 ?Z79.01] ?Paroxysmal atrial fibrillation (HCC) [I48.0] ? ?  ?  ? ? Comments:    ?  ? ?  ? ? ?No Known Allergies ? ?Current Outpatient Medications:  ?  acetaminophen (TYLENOL) 500 MG tablet, Take 1,000 mg by mouth every 6 (six) hours as needed for moderate pain or headache., Disp: , Rfl:  ?  amLODipine (NORVASC) 5 MG tablet, Take 5 mg by mouth daily., Disp: , Rfl:  ?  beclomethasone (QVAR) 80 MCG/ACT inhaler, Inhale 1 puff into the lungs daily., Disp: , Rfl:  ?  ECHINACEA PO, Take 2 tablets by mouth 4 (four) times daily as needed (cold symptoms)., Disp: , Rfl:  ?  fluticasone (FLONASE) 50 MCG/ACT nasal spray, Place 1 spray into both nostrils daily., Disp: , Rfl:  ?  hydrochlorothiazide (HYDRODIURIL) 25 MG tablet, Take 25 mg by mouth daily., Disp: , Rfl:  ?  KLOR-CON M20 20 MEQ  tablet, Take 20 mEq by mouth daily., Disp: , Rfl:  ?  loratadine (CLARITIN) 10 MG tablet, Take 10 mg by mouth daily as needed for allergies., Disp: , Rfl:  ?  metoprolol succinate (TOPROL XL) 25 MG 24 hr tablet, Take 1 tablet (25 mg total) by mouth daily., Disp: 90 tablet, Rfl: 3 ?  Multiple Vitamins-Minerals (ZINC PO), Take 1 tablet by mouth daily., Disp: , Rfl:  ?  omeprazole (PRILOSEC) 20 MG capsule, Take 40 mg by mouth daily., Disp: , Rfl:  ?  rosuvastatin (CRESTOR) 10 MG tablet, Take 10 mg by mouth daily., Disp: , Rfl:  ?  sildenafil (VIAGRA) 100 MG tablet, Take 100 mg by mouth daily as needed for erectile dysfunction., Disp: , Rfl:  ?  VITAMIN D PO, Take 1 capsule by mouth daily., Disp: , Rfl:  ?  warfarin (COUMADIN) 5 MG tablet, Take 2.5-5 mg by mouth See admin instructions. Or as directed by coumadin clinic Take 5 mg on Sun, Tues,Thurs, Sat. Take 2.5 mg on Mon, Wed, and Fri, Disp: , Rfl:  ?Past Medical History:  ?Diagnosis Date  ? Allergy   ? Atrial fibrillation ([place])   ? Heart murmur   ? Hyperlipidemia   ? Hypertension   ? Melanoma ([place])   ? MVP (mitral valve prolapse)   ? Prostate cancer ([place])   ? PVC (premature ventricular contraction)   ?  Stroke [facility])   ? ? ?ASSESSMENT ? ?Recent Results: ?The most recent result is correlated with 27.5 mg per week: ? ?Lab Results  ?Component Value Date  ? INR 3.3 (A) 04/21/2021  ? INR 2.7 04/13/2021  ? INR 1.8 (A) 04/06/2021  ? ? ?Anticoagulation Dosing: ?  ?INR today: Supratherapeutic. INR trending up on current weekly dose. Decreased recent Vit K intake possibly contributing to decreased anticoagulation effect. Pt planing on returning back to previously stable Vit K intake. Previously stable and controlled on weekly dose of 27.5 mg/week. Denies any complains of bleeding or bruising symptoms. Denies any other relevant changes in her diet, medications, or lifestyle. Will dose adjust today and then continue current weekly dose and continue close monitoring and follow  up ? ?PLAN ?Weekly dose was unchanged by  0 % to 27.5 mg per week. Continue current weekly dose of 2.5 mg every Mon, Wed, Fri and 5 mg all other days. Recheck INR in 1 weeks.  ? ?Patient Instructions  ?INR above goal. Take 2.5 mg today and then continue current weekly dose of 2.5 mg every Mon, Wed, Fri and 5 mg all other days. Recheck INR in 1 week.  ?Patient advised to contact clinic or seek medical attention if signs/symptoms of bleeding or thromboembolism occur. ? ?Patient verbalized understanding by repeating back information and was advised to contact me if further medication-related questions arise.  ? ?Follow-up ?Return in about 6 days (around 04/27/2021). ? ?[name], PharmD ? ?15 minutes spent face-to-face with the patient during the encounter. 50% of time spent on education, including signs/sx bleeding and clotting, as well as food and drug interactions with warfarin. 50% of time was spent on fingerprick POC INR sample collection,processing, results determination, and documentation ? ?

## 2021-04-27 ENCOUNTER — Ambulatory Visit: Payer: Self-pay | Admitting: Pharmacist

## 2021-04-27 DIAGNOSIS — Z5181 Encounter for therapeutic drug level monitoring: Secondary | ICD-10-CM

## 2021-04-27 DIAGNOSIS — Z9889 Other specified postprocedural states: Secondary | ICD-10-CM | POA: Diagnosis not present

## 2021-04-27 DIAGNOSIS — I480 Paroxysmal atrial fibrillation: Secondary | ICD-10-CM | POA: Diagnosis not present

## 2021-04-27 DIAGNOSIS — Z7901 Long term (current) use of anticoagulants: Secondary | ICD-10-CM | POA: Diagnosis not present

## 2021-04-27 LAB — POCT INR: INR: 2.3 (ref 2.0–3.0)

## 2021-04-27 NOTE — Progress Notes (Signed)
Anticoagulation Management ?Robert Lynn is a 71 y.o. male who reports to the clinic for monitoring of warfarin treatment.   ? ?Indication:  Paroxysmal Atrial Fibrillation, H/o mitral valve repair  CHA2DS2 Vasc Score 4 (Age 21-74, HTN hx, Hx of stroke), HAS-BLED 2 (Age>65, Stroke hx)  ?Duration: indefinite ?Supervising physician: Rex Kras ? ?Anticoagulation Clinic Visit History: ? ?Patient does not report signs/symptoms of bleeding or thromboembolism  ? ?Other recent changes: No change in diet, medications, lifestyle ? ?Continues to do well without any noted changes. Maintains Vit K intake of 3-4 serving/week. Returns back to previous baseline. ? ?Anticoagulation Episode Summary   ? ? Current INR goal:  2.0-3.0  ?TTR:  68.6 % (9.9 mo)  ?Next INR check:  05/04/2021  ?INR from last check:  2.3 (04/27/2021)  ?Weekly max warfarin dose:    ?Target end date:  Indefinite  ?INR check location:    ?Preferred lab:    ?Send INR reminders to:    ? Indications   ?H/O mitral valve repair [Z98.890] ?Monitoring for long-term anticoagulant use [Z51.81 ?Z79.01] ?Paroxysmal atrial fibrillation (HCC) [I48.0] ? ?  ?  ? ? Comments:    ?  ? ?  ? ? ?No Known Allergies ? ?Current Outpatient Medications:  ?  acetaminophen (TYLENOL) 500 MG tablet, Take 1,000 mg by mouth every 6 (six) hours as needed for moderate pain or headache., Disp: , Rfl:  ?  amLODipine (NORVASC) 5 MG tablet, Take 5 mg by mouth daily., Disp: , Rfl:  ?  beclomethasone (QVAR) 80 MCG/ACT inhaler, Inhale 1 puff into the lungs daily., Disp: , Rfl:  ?  ECHINACEA PO, Take 2 tablets by mouth 4 (four) times daily as needed (cold symptoms)., Disp: , Rfl:  ?  fluticasone (FLONASE) 50 MCG/ACT nasal spray, Place 1 spray into both nostrils daily., Disp: , Rfl:  ?  hydrochlorothiazide (HYDRODIURIL) 25 MG tablet, Take 25 mg by mouth daily., Disp: , Rfl:  ?  KLOR-CON M20 20 MEQ tablet, Take 20 mEq by mouth daily., Disp: , Rfl:  ?  loratadine (CLARITIN) 10 MG tablet, Take 10 mg by  mouth daily as needed for allergies., Disp: , Rfl:  ?  metoprolol succinate (TOPROL XL) 25 MG 24 hr tablet, Take 1 tablet (25 mg total) by mouth daily., Disp: 90 tablet, Rfl: 3 ?  Multiple Vitamins-Minerals (ZINC PO), Take 1 tablet by mouth daily., Disp: , Rfl:  ?  omeprazole (PRILOSEC) 20 MG capsule, Take 40 mg by mouth daily., Disp: , Rfl:  ?  rosuvastatin (CRESTOR) 10 MG tablet, Take 10 mg by mouth daily., Disp: , Rfl:  ?  sildenafil (VIAGRA) 100 MG tablet, Take 100 mg by mouth daily as needed for erectile dysfunction., Disp: , Rfl:  ?  VITAMIN D PO, Take 1 capsule by mouth daily., Disp: , Rfl:  ?  warfarin (COUMADIN) 5 MG tablet, Take 2.5-5 mg by mouth See admin instructions. Or as directed by coumadin clinic Take 5 mg on Sun, Tues,Thurs, Sat. Take 2.5 mg on Mon, Wed, and Fri, Disp: , Rfl:  ?Past Medical History:  ?Diagnosis Date  ? Allergy   ? Atrial fibrillation ([place])   ? Heart murmur   ? Hyperlipidemia   ? Hypertension   ? Melanoma ([place])   ? MVP (mitral valve prolapse)   ? Prostate cancer ([place])   ? PVC (premature ventricular contraction)   ? Stroke [facility])   ? ? ?ASSESSMENT ? ?Recent Results: ?The most recent result is correlated with 27.5  mg per week: ? ?Lab Results  ?Component Value Date  ? INR 2.3 04/27/2021  ? INR 3.3 (A) 04/21/2021  ? INR 2.7 04/13/2021  ? ? ?Anticoagulation Dosing: ?  ?INR today: Therapeutic. INR improves following returning back to previous baseline Vit K intake. Pt pagrees to stay consistent with his Vit K intake. Stable and controlled on weekly dose of 27.5 mg/week. Denies any complains of bleeding or bruising symptoms. Denies any other relevant changes in her diet, medications, or lifestyle. Will continue current weekly dose and follow up as needed  ? ?PLAN ?Weekly dose was unchanged by  0 % to 27.5 mg per week. Continue current weekly dose of 2.5 mg every Mon, Wed, Fri and 5 mg all other days. Recheck INR in 1 weeks.  ? ?Patient Instructions  ?INR at goal. Continue current weekly  dose of 2.5 mg every Mon, Wed, Fri and 5 mg all other days. Recheck INR in 1 week.  ?Patient advised to contact clinic or seek medical attention if signs/symptoms of bleeding or thromboembolism occur. ? ?Patient verbalized understanding by repeating back information and was advised to contact me if further medication-related questions arise.  ? ?Follow-up ?Return in about 1 week (around 05/04/2021). ? ?[name], PharmD ? ?15 minutes spent face-to-face with the patient during the encounter. 50% of time spent on education, including signs/sx bleeding and clotting, as well as food and drug interactions with warfarin. 50% of time was spent on fingerprick POC INR sample collection,processing, results determination, and documentation ? ?

## 2021-04-27 NOTE — Patient Instructions (Signed)
INR at goal. Continue current weekly dose of 2.5 mg every Mon, Wed, Fri and 5 mg all other days. Recheck INR in 1 week.  ?

## 2021-05-04 LAB — POCT INR: INR: 2.3 (ref 2.0–3.0)

## 2021-05-06 ENCOUNTER — Ambulatory Visit: Payer: Self-pay | Admitting: Pharmacist

## 2021-05-06 DIAGNOSIS — Z5181 Encounter for therapeutic drug level monitoring: Secondary | ICD-10-CM

## 2021-05-06 DIAGNOSIS — I480 Paroxysmal atrial fibrillation: Secondary | ICD-10-CM

## 2021-05-06 DIAGNOSIS — Z7901 Long term (current) use of anticoagulants: Secondary | ICD-10-CM | POA: Diagnosis not present

## 2021-05-06 DIAGNOSIS — Z9889 Other specified postprocedural states: Secondary | ICD-10-CM | POA: Diagnosis not present

## 2021-05-06 NOTE — Patient Instructions (Signed)
INR at goal. Continue current weekly dose of 2.5 mg every Mon, Wed, Fri and 5 mg all other days. Recheck INR in 1 week.  ?

## 2021-05-06 NOTE — Progress Notes (Signed)
Anticoagulation Management ?Robert Lynn is a 71 y.o. male who reports to the clinic for monitoring of warfarin treatment.   ? ?Indication:  Paroxysmal Atrial Fibrillation, H/o mitral valve repair  CHA2DS2 Vasc Score 4 (Age 25-74, HTN hx, Hx of stroke), HAS-BLED 2 (Age>65, Stroke hx)  ?Duration: indefinite ?Supervising physician: Rex Kras ? ?Anticoagulation Clinic Visit History: ? ?Patient does not report signs/symptoms of bleeding or thromboembolism  ? ?Other recent changes: No change in diet, medications, lifestyle ? ?Continues to do well without any noted changes. Maintains Vit K intake of 3-4 serving/week.  ? ?Anticoagulation Episode Summary   ? ? Current INR goal:  2.0-3.0  ?TTR:  69.3 % (10.1 mo)  ?Next INR check:  05/13/2021  ?INR from last check:  2.3 (05/04/2021)  ?Weekly max warfarin dose:    ?Target end date:  Indefinite  ?INR check location:    ?Preferred lab:    ?Send INR reminders to:    ? Indications   ?H/O mitral valve repair [Z98.890] ?Monitoring for long-term anticoagulant use [Z51.81 ?Z79.01] ?Paroxysmal atrial fibrillation (HCC) [I48.0] ? ?  ?  ? ? Comments:    ?  ? ?  ? ? ?No Known Allergies ? ?Current Outpatient Medications:  ?  acetaminophen (TYLENOL) 500 MG tablet, Take 1,000 mg by mouth every 6 (six) hours as needed for moderate pain or headache., Disp: , Rfl:  ?  amLODipine (NORVASC) 5 MG tablet, Take 5 mg by mouth daily., Disp: , Rfl:  ?  beclomethasone (QVAR) 80 MCG/ACT inhaler, Inhale 1 puff into the lungs daily., Disp: , Rfl:  ?  ECHINACEA PO, Take 2 tablets by mouth 4 (four) times daily as needed (cold symptoms)., Disp: , Rfl:  ?  fluticasone (FLONASE) 50 MCG/ACT nasal spray, Place 1 spray into both nostrils daily., Disp: , Rfl:  ?  hydrochlorothiazide (HYDRODIURIL) 25 MG tablet, Take 25 mg by mouth daily., Disp: , Rfl:  ?  KLOR-CON M20 20 MEQ tablet, Take 20 mEq by mouth daily., Disp: , Rfl:  ?  loratadine (CLARITIN) 10 MG tablet, Take 10 mg by mouth daily as needed for allergies.,  Disp: , Rfl:  ?  metoprolol succinate (TOPROL XL) 25 MG 24 hr tablet, Take 1 tablet (25 mg total) by mouth daily., Disp: 90 tablet, Rfl: 3 ?  Multiple Vitamins-Minerals (ZINC PO), Take 1 tablet by mouth daily., Disp: , Rfl:  ?  omeprazole (PRILOSEC) 20 MG capsule, Take 40 mg by mouth daily., Disp: , Rfl:  ?  rosuvastatin (CRESTOR) 10 MG tablet, Take 10 mg by mouth daily., Disp: , Rfl:  ?  sildenafil (VIAGRA) 100 MG tablet, Take 100 mg by mouth daily as needed for erectile dysfunction., Disp: , Rfl:  ?  VITAMIN D PO, Take 1 capsule by mouth daily., Disp: , Rfl:  ?  warfarin (COUMADIN) 5 MG tablet, Take 2.5-5 mg by mouth See admin instructions. Or as directed by coumadin clinic Take 5 mg on Sun, Tues,Thurs, Sat. Take 2.5 mg on Mon, Wed, and Fri, Disp: , Rfl:  ?Past Medical History:  ?Diagnosis Date  ? Allergy   ? Atrial fibrillation ([place])   ? Heart murmur   ? Hyperlipidemia   ? Hypertension   ? Melanoma ([place])   ? MVP (mitral valve prolapse)   ? Prostate cancer ([place])   ? PVC (premature ventricular contraction)   ? Stroke [facility])   ? ? ?ASSESSMENT ? ?Recent Results: ?The most recent result is correlated with 27.5 mg per week: ? ?  Lab Results  ?Component Value Date  ? INR 2.3 05/04/2021  ? INR 2.3 04/27/2021  ? INR 3.3 (A) 04/21/2021  ? ? ?Anticoagulation Dosing: ?  ?INR today: Therapeutic. INR continues to remain therapeutic on current weekly dose. Maintaining consistent with his Vit K intake. Stable and controlled on weekly dose of 27.5 mg/week. Denies any complains of bleeding or bruising symptoms. Denies any other relevant changes in her diet, medications, or lifestyle. Will continue current weekly dose and follow up as scheduled ? ?PLAN ?Weekly dose was unchanged by  0 % to 27.5 mg per week. Continue current weekly dose of 2.5 mg every Mon, Wed, Fri and 5 mg all other days. Recheck INR in 1 weeks.  ? ?Patient Instructions  ?INR at goal. Continue current weekly dose of 2.5 mg every Mon, Wed, Fri and 5 mg all other days.  Recheck INR in 1 week.  ?Patient advised to contact clinic or seek medical attention if signs/symptoms of bleeding or thromboembolism occur. ? ?Patient verbalized understanding by repeating back information and was advised to contact me if further medication-related questions arise.  ? ?Follow-up ?Return in about 1 week (around 05/13/2021). ? ?[name], PharmD ? ?15 minutes spent face-to-face with the patient during the encounter. 50% of time spent on education, including signs/sx bleeding and clotting, as well as food and drug interactions with warfarin. 50% of time was spent on fingerprick POC INR sample collection,processing, results determination, and documentation ? ?

## 2021-05-12 ENCOUNTER — Ambulatory Visit: Payer: Self-pay | Admitting: Pharmacist

## 2021-05-12 DIAGNOSIS — I480 Paroxysmal atrial fibrillation: Secondary | ICD-10-CM

## 2021-05-12 DIAGNOSIS — Z9889 Other specified postprocedural states: Secondary | ICD-10-CM

## 2021-05-12 DIAGNOSIS — Z5181 Encounter for therapeutic drug level monitoring: Secondary | ICD-10-CM | POA: Diagnosis not present

## 2021-05-12 DIAGNOSIS — Z7901 Long term (current) use of anticoagulants: Secondary | ICD-10-CM | POA: Diagnosis not present

## 2021-05-12 LAB — POCT INR: INR: 3.3 — AB (ref 2.0–3.0)

## 2021-05-12 NOTE — Progress Notes (Signed)
Anticoagulation Management ?INGRAM ONNEN is a 71 y.o. male who reports to the clinic for monitoring of warfarin treatment.   ? ?Indication:  Paroxysmal Atrial Fibrillation, H/o mitral valve repair  CHA2DS2 Vasc Score 4 (Age 39-74, HTN hx, Hx of stroke), HAS-BLED 2 (Age>65, Stroke hx)  ?Duration: indefinite ?Supervising physician: Rex Kras ? ?Anticoagulation Clinic Visit History: ? ?Patient does not report signs/symptoms of bleeding or thromboembolism  ? ?Other recent changes: No change in diet, medications, lifestyle ? ?Continues to do well without any noted changes. Maintains Vit K intake of 3-4 serving/week. Pt reports that he had lower than previous baseline Vit K intake. Planing on returning back to previously stable intake consisting of broccoli and spinach.  ? ?Anticoagulation Episode Summary   ? ? Current INR goal:  2.0-3.0  ?TTR:  69.3 % (10.4 mo)  ?Next INR check:  05/19/2021  ?INR from last check:  3.3 (05/12/2021)  ?Weekly max warfarin dose:    ?Target end date:  Indefinite  ?INR check location:    ?Preferred lab:    ?Send INR reminders to:    ? Indications   ?H/O mitral valve repair [Z98.890] ?Monitoring for long-term anticoagulant use [Z51.81 ?Z79.01] ?Paroxysmal atrial fibrillation (HCC) [I48.0] ? ?  ?  ? ? Comments:    ?  ? ?  ? ? ?No Known Allergies ? ?Current Outpatient Medications:  ?  acetaminophen (TYLENOL) 500 MG tablet, Take 1,000 mg by mouth every 6 (six) hours as needed for moderate pain or headache., Disp: , Rfl:  ?  amLODipine (NORVASC) 5 MG tablet, Take 5 mg by mouth daily., Disp: , Rfl:  ?  beclomethasone (QVAR) 80 MCG/ACT inhaler, Inhale 1 puff into the lungs daily., Disp: , Rfl:  ?  ECHINACEA PO, Take 2 tablets by mouth 4 (four) times daily as needed (cold symptoms)., Disp: , Rfl:  ?  fluticasone (FLONASE) 50 MCG/ACT nasal spray, Place 1 spray into both nostrils daily., Disp: , Rfl:  ?  hydrochlorothiazide (HYDRODIURIL) 25 MG tablet, Take 25 mg by mouth daily., Disp: , Rfl:  ?   KLOR-CON M20 20 MEQ tablet, Take 20 mEq by mouth daily., Disp: , Rfl:  ?  loratadine (CLARITIN) 10 MG tablet, Take 10 mg by mouth daily as needed for allergies., Disp: , Rfl:  ?  metoprolol succinate (TOPROL XL) 25 MG 24 hr tablet, Take 1 tablet (25 mg total) by mouth daily., Disp: 90 tablet, Rfl: 3 ?  Multiple Vitamins-Minerals (ZINC PO), Take 1 tablet by mouth daily., Disp: , Rfl:  ?  omeprazole (PRILOSEC) 20 MG capsule, Take 40 mg by mouth daily., Disp: , Rfl:  ?  rosuvastatin (CRESTOR) 10 MG tablet, Take 10 mg by mouth daily., Disp: , Rfl:  ?  sildenafil (VIAGRA) 100 MG tablet, Take 100 mg by mouth daily as needed for erectile dysfunction., Disp: , Rfl:  ?  VITAMIN D PO, Take 1 capsule by mouth daily., Disp: , Rfl:  ?  warfarin (COUMADIN) 5 MG tablet, Take 2.5-5 mg by mouth See admin instructions. Or as directed by coumadin clinic Take 5 mg on Sun, Tues,Thurs, Sat. Take 2.5 mg on Mon, Wed, and Fri, Disp: , Rfl:  ?Past Medical History:  ?Diagnosis Date  ? Allergy   ? Atrial fibrillation ([place])   ? Heart murmur   ? Hyperlipidemia   ? Hypertension   ? Melanoma ([place])   ? MVP (mitral valve prolapse)   ? Prostate cancer ([place])   ? PVC (premature ventricular contraction)   ?  Stroke [facility])   ? ? ?ASSESSMENT ? ?Recent Results: ?The most recent result is correlated with 27.5 mg per week: ? ?Lab Results  ?Component Value Date  ? INR 3.3 (A) 05/12/2021  ? INR 2.3 05/04/2021  ? INR 2.3 04/27/2021  ? ? ?Anticoagulation Dosing: ?  ?INR today: Supratherapeutic. In setting of lower than previously stable Vit K intake. Pt has been previously stable and controlled on weekly dose of 27.5 mg/week. Pt agreeable to maintain consistent with his Vit K intake. Denies any complains of bleeding or bruising symptoms. Denies any other relevant changes in her diet, medications, or lifestyle. Will have pt take 2.5 mg today and continue close monitoring and follow up.  ? ?PLAN ?Weekly dose was unchanged by  0 % to 27.5 mg per week. Take 2.5 mg  today and then continue current weekly dose of 2.5 mg every Mon, Wed, Fri and 5 mg all other days. Recheck INR in 1 weeks.  ? ?Patient Instructions  ?INR above goal. Take 2.5 mg today and then continue current weekly dose of 2.5 mg every Mon, Wed, Fri and 5 mg all other days. Recheck INR in 1 week.  ?Patient advised to contact clinic or seek medical attention if signs/symptoms of bleeding or thromboembolism occur. ? ?Patient verbalized understanding by repeating back information and was advised to contact me if further medication-related questions arise.  ? ?Follow-up ?Return in about 1 week (around 05/19/2021). ? ?[name], PharmD ? ?15 minutes spent face-to-face with the patient during the encounter. 50% of time spent on education, including signs/sx bleeding and clotting, as well as food and drug interactions with warfarin. 50% of time was spent on fingerprick POC INR sample collection,processing, results determination, and documentation ? ?

## 2021-05-12 NOTE — Patient Instructions (Signed)
INR above goal. Take 2.5 mg today and then continue current weekly dose of 2.5 mg every Mon, Wed, Fri and 5 mg all other days. Recheck INR in 1 week.  ?

## 2021-05-18 ENCOUNTER — Ambulatory Visit: Payer: Self-pay | Admitting: Pharmacist

## 2021-05-18 DIAGNOSIS — Z9889 Other specified postprocedural states: Secondary | ICD-10-CM

## 2021-05-18 DIAGNOSIS — Z7901 Long term (current) use of anticoagulants: Secondary | ICD-10-CM

## 2021-05-18 DIAGNOSIS — I480 Paroxysmal atrial fibrillation: Secondary | ICD-10-CM | POA: Diagnosis not present

## 2021-05-18 DIAGNOSIS — Z5181 Encounter for therapeutic drug level monitoring: Secondary | ICD-10-CM

## 2021-05-18 LAB — POCT INR: INR: 2.5 (ref 2.0–3.0)

## 2021-05-18 NOTE — Patient Instructions (Signed)
INR at goal. Continue current weekly dose of 2.5 mg every Mon, Wed, Fri and 5 mg all other days. Recheck INR in 1 week.  ?

## 2021-05-18 NOTE — Progress Notes (Signed)
Anticoagulation Management ?Robert Lynn is a 71 y.o. male who reports to the clinic for monitoring of warfarin treatment.   ? ?Indication:  Paroxysmal Atrial Fibrillation, H/o mitral valve repair  CHA2DS2 Vasc Score 4 (Age 11-74, HTN hx, Hx of stroke), HAS-BLED 2 (Age>65, Stroke hx)  ?Duration: indefinite ?Supervising physician: Rex Kras ? ?Anticoagulation Clinic Visit History: ? ?Patient does not report signs/symptoms of bleeding or thromboembolism  ? ?Other recent changes: No change in diet, medications, lifestyle ? ?Continues to do well without any noted changes. Maintains Vit K intake of 3-4 serving/week.  ? ?Anticoagulation Episode Summary   ? ? Current INR goal:  2.0-3.0  ?TTR:  69.2 % (10.6 mo)  ?Next INR check:  05/25/2021  ?INR from last check:  2.5 (05/18/2021)  ?Weekly max warfarin dose:    ?Target end date:  Indefinite  ?INR check location:    ?Preferred lab:    ?Send INR reminders to:    ? Indications   ?H/O mitral valve repair [Z98.890] ?Monitoring for long-term anticoagulant use [Z51.81 ?Z79.01] ?Paroxysmal atrial fibrillation (HCC) [I48.0] ? ?  ?  ? ? Comments:    ?  ? ?  ? ? ?No Known Allergies ? ?Current Outpatient Medications:  ?  acetaminophen (TYLENOL) 500 MG tablet, Take 1,000 mg by mouth every 6 (six) hours as needed for moderate pain or headache., Disp: , Rfl:  ?  amLODipine (NORVASC) 5 MG tablet, Take 5 mg by mouth daily., Disp: , Rfl:  ?  beclomethasone (QVAR) 80 MCG/ACT inhaler, Inhale 1 puff into the lungs daily., Disp: , Rfl:  ?  ECHINACEA PO, Take 2 tablets by mouth 4 (four) times daily as needed (cold symptoms)., Disp: , Rfl:  ?  fluticasone (FLONASE) 50 MCG/ACT nasal spray, Place 1 spray into both nostrils daily., Disp: , Rfl:  ?  hydrochlorothiazide (HYDRODIURIL) 25 MG tablet, Take 25 mg by mouth daily., Disp: , Rfl:  ?  KLOR-CON M20 20 MEQ tablet, Take 20 mEq by mouth daily., Disp: , Rfl:  ?  loratadine (CLARITIN) 10 MG tablet, Take 10 mg by mouth daily as needed for  allergies., Disp: , Rfl:  ?  metoprolol succinate (TOPROL XL) 25 MG 24 hr tablet, Take 1 tablet (25 mg total) by mouth daily., Disp: 90 tablet, Rfl: 3 ?  Multiple Vitamins-Minerals (ZINC PO), Take 1 tablet by mouth daily., Disp: , Rfl:  ?  omeprazole (PRILOSEC) 20 MG capsule, Take 40 mg by mouth daily., Disp: , Rfl:  ?  rosuvastatin (CRESTOR) 10 MG tablet, Take 10 mg by mouth daily., Disp: , Rfl:  ?  sildenafil (VIAGRA) 100 MG tablet, Take 100 mg by mouth daily as needed for erectile dysfunction., Disp: , Rfl:  ?  VITAMIN D PO, Take 1 capsule by mouth daily., Disp: , Rfl:  ?  warfarin (COUMADIN) 5 MG tablet, Take 2.5-5 mg by mouth See admin instructions. Or as directed by coumadin clinic Take 5 mg on Sun, Tues,Thurs, Sat. Take 2.5 mg on Mon, Wed, and Fri, Disp: , Rfl:  ?Past Medical History:  ?Diagnosis Date  ? Allergy   ? Atrial fibrillation ([place])   ? Heart murmur   ? Hyperlipidemia   ? Hypertension   ? Melanoma ([place])   ? MVP (mitral valve prolapse)   ? Prostate cancer ([place])   ? PVC (premature ventricular contraction)   ? Stroke [facility])   ? ? ?ASSESSMENT ? ?Recent Results: ?The most recent result is correlated with 27.5 mg per week: ? ?  Lab Results  ?Component Value Date  ? INR 2.5 05/18/2021  ? INR 3.3 (A) 05/12/2021  ? INR 2.3 05/04/2021  ? ? ?Anticoagulation Dosing: ?  ?INR today: Therapeutic. Following to returning to baseline Vit K intake. Previously stable and controlled on weekly dose of 27.5 mg/week. Pt agreeable to maintain consistent with his Vit K intake. Denies any complains of bleeding or bruising symptoms. Denies any other relevant changes in her diet, medications, or lifestyle. Will continue current weekly dose and continue close monitoring and follow up as needed.  ? ?PLAN ?Weekly dose was unchanged by  0 % to 27.5 mg per week. Continue current weekly dose of 2.5 mg every Mon, Wed, Fri and 5 mg all other days. Recheck INR in 1 weeks.  ? ?Patient Instructions  ?INR at goal. Continue current weekly dose  of 2.5 mg every Mon, Wed, Fri and 5 mg all other days. Recheck INR in 1 week.  ?Patient advised to contact clinic or seek medical attention if signs/symptoms of bleeding or thromboembolism occur. ? ?Patient verbalized understanding by repeating back information and was advised to contact me if further medication-related questions arise.  ? ?Follow-up ?Return in about 1 week (around 05/25/2021). ? ?[name], PharmD ? ?15 minutes spent face-to-face with the patient during the encounter. 50% of time spent on education, including signs/sx bleeding and clotting, as well as food and drug interactions with warfarin. 50% of time was spent on fingerprick POC INR sample collection,processing, results determination, and documentation ? ?

## 2021-05-25 ENCOUNTER — Ambulatory Visit: Payer: Self-pay | Admitting: Pharmacist

## 2021-05-25 DIAGNOSIS — I480 Paroxysmal atrial fibrillation: Secondary | ICD-10-CM | POA: Diagnosis not present

## 2021-05-25 DIAGNOSIS — Z5181 Encounter for therapeutic drug level monitoring: Secondary | ICD-10-CM | POA: Diagnosis not present

## 2021-05-25 DIAGNOSIS — Z7901 Long term (current) use of anticoagulants: Secondary | ICD-10-CM | POA: Diagnosis not present

## 2021-05-25 DIAGNOSIS — Z9889 Other specified postprocedural states: Secondary | ICD-10-CM | POA: Diagnosis not present

## 2021-05-25 LAB — POCT INR: INR: 3 (ref 2.0–3.0)

## 2021-05-25 NOTE — Progress Notes (Signed)
Anticoagulation Management ?Robert Lynn is a 71 y.o. male who reports to the clinic for monitoring of warfarin treatment.   ? ?Indication:  Paroxysmal Atrial Fibrillation, H/o mitral valve repair  CHA2DS2 Vasc Score 4 (Age 33-74, HTN hx, Hx of stroke), HAS-BLED 2 (Age>65, Stroke hx)  ?Duration: indefinite ?Supervising physician: Rex Kras ? ?Anticoagulation Clinic Visit History: ? ?Patient does not report signs/symptoms of bleeding or thromboembolism  ? ?Other recent changes: No change in diet, medications, lifestyle ? ?Continues to do well without any noted changes. Maintains Vit K intake of 3-4 serving/week. Pt reports recent decreased broccoli intake, but planing on returning back to previous baseline.  ? ?Anticoagulation Episode Summary   ? ? Current INR goal:  2.0-3.0  ?TTR:  69.9 % (10.9 mo)  ?Next INR check:  06/01/2021  ?INR from last check:  3.0 (05/25/2021)  ?Weekly max warfarin dose:    ?Target end date:  Indefinite  ?INR check location:    ?Preferred lab:    ?Send INR reminders to:    ? Indications   ?H/O mitral valve repair [Z98.890] ?Monitoring for long-term anticoagulant use [Z51.81 ?Z79.01] ?Paroxysmal atrial fibrillation (HCC) [I48.0] ? ?  ?  ? ? Comments:    ?  ? ?  ? ? ?No Known Allergies ? ?Current Outpatient Medications:  ?  acetaminophen (TYLENOL) 500 MG tablet, Take 1,000 mg by mouth every 6 (six) hours as needed for moderate pain or headache., Disp: , Rfl:  ?  amLODipine (NORVASC) 5 MG tablet, Take 5 mg by mouth daily., Disp: , Rfl:  ?  beclomethasone (QVAR) 80 MCG/ACT inhaler, Inhale 1 puff into the lungs daily., Disp: , Rfl:  ?  ECHINACEA PO, Take 2 tablets by mouth 4 (four) times daily as needed (cold symptoms)., Disp: , Rfl:  ?  fluticasone (FLONASE) 50 MCG/ACT nasal spray, Place 1 spray into both nostrils daily., Disp: , Rfl:  ?  hydrochlorothiazide (HYDRODIURIL) 25 MG tablet, Take 25 mg by mouth daily., Disp: , Rfl:  ?  KLOR-CON M20 20 MEQ tablet, Take 20 mEq by mouth daily., Disp:  , Rfl:  ?  loratadine (CLARITIN) 10 MG tablet, Take 10 mg by mouth daily as needed for allergies., Disp: , Rfl:  ?  metoprolol succinate (TOPROL XL) 25 MG 24 hr tablet, Take 1 tablet (25 mg total) by mouth daily., Disp: 90 tablet, Rfl: 3 ?  Multiple Vitamins-Minerals (ZINC PO), Take 1 tablet by mouth daily., Disp: , Rfl:  ?  omeprazole (PRILOSEC) 20 MG capsule, Take 40 mg by mouth daily., Disp: , Rfl:  ?  rosuvastatin (CRESTOR) 10 MG tablet, Take 10 mg by mouth daily., Disp: , Rfl:  ?  sildenafil (VIAGRA) 100 MG tablet, Take 100 mg by mouth daily as needed for erectile dysfunction., Disp: , Rfl:  ?  VITAMIN D PO, Take 1 capsule by mouth daily., Disp: , Rfl:  ?  warfarin (COUMADIN) 5 MG tablet, Take 2.5-5 mg by mouth See admin instructions. Or as directed by coumadin clinic Take 5 mg on Sun, Tues,Thurs, Sat. Take 2.5 mg on Mon, Wed, and Fri, Disp: , Rfl:  ?Past Medical History:  ?Diagnosis Date  ? Allergy   ? Atrial fibrillation ([place])   ? Heart murmur   ? Hyperlipidemia   ? Hypertension   ? Melanoma ([place])   ? MVP (mitral valve prolapse)   ? Prostate cancer ([place])   ? PVC (premature ventricular contraction)   ? Stroke [facility])   ? ? ?ASSESSMENT ? ?  Recent Results: ?The most recent result is correlated with 27.5 mg per week: ? ?Lab Results  ?Component Value Date  ? INR 3.0 05/25/2021  ? INR 2.5 05/18/2021  ? INR 3.3 (A) 05/12/2021  ? ? ?Anticoagulation Dosing: ?  ?INR today: Therapeutic.Continues to remain therapeutic on current weekly dose. Pt agreeable to maintain consistent with his Vit K intake. Denies any complains of bleeding or bruising symptoms. Denies any other relevant changes in her diet, medications, or lifestyle. Will continue current weekly dose and continue close monitoring and follow up as needed.  ? ?PLAN ?Weekly dose was unchanged by  0 % to 27.5 mg per week. Continue current weekly dose of 2.5 mg every Mon, Wed, Fri and 5 mg all other days. Recheck INR in 1 weeks.  ? ?Patient Instructions  ?INR at goal.  Continue current weekly dose of 2.5 mg every Mon, Wed, Fri and 5 mg all other days. Recheck INR in 1 week.  ?Patient advised to contact clinic or seek medical attention if signs/symptoms of bleeding or thromboembolism occur. ? ?Patient verbalized understanding by repeating back information and was advised to contact me if further medication-related questions arise.  ? ?Follow-up ?Return in about 1 week (around 06/01/2021). ? ?[name], PharmD ? ?15 minutes spent face-to-face with the patient during the encounter. 50% of time spent on education, including signs/sx bleeding and clotting, as well as food and drug interactions with warfarin. 50% of time was spent on fingerprick POC INR sample collection,processing, results determination, and documentation ? ?

## 2021-05-25 NOTE — Patient Instructions (Signed)
INR at goal. Continue current weekly dose of 2.5 mg every Mon, Wed, Fri and 5 mg all other days. Recheck INR in 1 week.  ?

## 2021-06-01 ENCOUNTER — Ambulatory Visit: Payer: Self-pay | Admitting: Pharmacist

## 2021-06-01 DIAGNOSIS — Z7901 Long term (current) use of anticoagulants: Secondary | ICD-10-CM | POA: Diagnosis not present

## 2021-06-01 DIAGNOSIS — Z5181 Encounter for therapeutic drug level monitoring: Secondary | ICD-10-CM | POA: Diagnosis not present

## 2021-06-01 DIAGNOSIS — Z9889 Other specified postprocedural states: Secondary | ICD-10-CM | POA: Diagnosis not present

## 2021-06-01 DIAGNOSIS — I480 Paroxysmal atrial fibrillation: Secondary | ICD-10-CM | POA: Diagnosis not present

## 2021-06-01 LAB — POCT INR: INR: 2.3 (ref 2.0–3.0)

## 2021-06-01 NOTE — Progress Notes (Signed)
Anticoagulation Management ?Robert Lynn is a 71 y.o. male who reports to the clinic for monitoring of warfarin treatment.   ? ?Indication:  Paroxysmal Atrial Fibrillation, H/o mitral valve repair  CHA2DS2 Vasc Score 4 (Age 40-74, HTN hx, Hx of stroke), HAS-BLED 2 (Age>65, Stroke hx)  ?Duration: indefinite ?Supervising physician: Rex Kras ? ?Anticoagulation Clinic Visit History: ? ?Patient does not report signs/symptoms of bleeding or thromboembolism  ? ?Other recent changes: No change in diet, medications, lifestyle ? ?Continues to do well without any noted changes. Maintains Vit K intake of 3-4 serving/week. No new changes ? ?Anticoagulation Episode Summary   ? ? Current INR goal:  2.0-3.0  ?TTR:  70.5 % (11.1 mo)  ?Next INR check:  06/08/2021  ?INR from last check:  2.3 (06/01/2021)  ?Weekly max warfarin dose:    ?Target end date:  Indefinite  ?INR check location:    ?Preferred lab:    ?Send INR reminders to:    ? Indications   ?H/O mitral valve repair [Z98.890] ?Monitoring for long-term anticoagulant use [Z51.81 ?Z79.01] ?Paroxysmal atrial fibrillation (HCC) [I48.0] ? ?  ?  ? ? Comments:    ?  ? ?  ? ? ?No Known Allergies ? ?Current Outpatient Medications:  ?  acetaminophen (TYLENOL) 500 MG tablet, Take 1,000 mg by mouth every 6 (six) hours as needed for moderate pain or headache., Disp: , Rfl:  ?  amLODipine (NORVASC) 5 MG tablet, Take 5 mg by mouth daily., Disp: , Rfl:  ?  beclomethasone (QVAR) 80 MCG/ACT inhaler, Inhale 1 puff into the lungs daily., Disp: , Rfl:  ?  ECHINACEA PO, Take 2 tablets by mouth 4 (four) times daily as needed (cold symptoms)., Disp: , Rfl:  ?  fluticasone (FLONASE) 50 MCG/ACT nasal spray, Place 1 spray into both nostrils daily., Disp: , Rfl:  ?  hydrochlorothiazide (HYDRODIURIL) 25 MG tablet, Take 25 mg by mouth daily., Disp: , Rfl:  ?  KLOR-CON M20 20 MEQ tablet, Take 20 mEq by mouth daily., Disp: , Rfl:  ?  loratadine (CLARITIN) 10 MG tablet, Take 10 mg by mouth daily as needed  for allergies., Disp: , Rfl:  ?  metoprolol succinate (TOPROL XL) 25 MG 24 hr tablet, Take 1 tablet (25 mg total) by mouth daily., Disp: 90 tablet, Rfl: 3 ?  Multiple Vitamins-Minerals (ZINC PO), Take 1 tablet by mouth daily., Disp: , Rfl:  ?  omeprazole (PRILOSEC) 20 MG capsule, Take 40 mg by mouth daily., Disp: , Rfl:  ?  rosuvastatin (CRESTOR) 10 MG tablet, Take 10 mg by mouth daily., Disp: , Rfl:  ?  sildenafil (VIAGRA) 100 MG tablet, Take 100 mg by mouth daily as needed for erectile dysfunction., Disp: , Rfl:  ?  VITAMIN D PO, Take 1 capsule by mouth daily., Disp: , Rfl:  ?  warfarin (COUMADIN) 5 MG tablet, Take 2.5-5 mg by mouth See admin instructions. Or as directed by coumadin clinic Take 5 mg on Sun, Tues,Thurs, Sat. Take 2.5 mg on Mon, Wed, and Fri, Disp: , Rfl:  ?Past Medical History:  ?Diagnosis Date  ? Allergy   ? Atrial fibrillation ([place])   ? Heart murmur   ? Hyperlipidemia   ? Hypertension   ? Melanoma ([place])   ? MVP (mitral valve prolapse)   ? Prostate cancer ([place])   ? PVC (premature ventricular contraction)   ? Stroke [facility])   ? ? ?ASSESSMENT ? ?Recent Results: ?The most recent result is correlated with 27.5 mg per  week: ? ?Lab Results  ?Component Value Date  ? INR 2.3 06/01/2021  ? INR 3.0 05/25/2021  ? INR 2.5 05/18/2021  ? ? ?Anticoagulation Dosing: ?  ?INR today: Therapeutic.Continues to remain therapeutic on current weekly dose. Pt agreeable to maintain consistent with his Vit K intake. Denies any complains of bleeding or bruising symptoms. Denies any other relevant changes in her diet, medications, or lifestyle. Will continue current weekly dose and continue close monitoring and follow up as needed.  ? ?PLAN ?Weekly dose was unchanged by  0 % to 27.5 mg per week. Continue current weekly dose of 2.5 mg every Mon, Wed, Fri and 5 mg all other days. Recheck INR in 1 weeks.  ? ?Patient Instructions  ?INR at goal. Continue current weekly dose of 2.5 mg every Mon, Wed, Fri and 5 mg all other days.  Recheck INR in 1 week.  ?Patient advised to contact clinic or seek medical attention if signs/symptoms of bleeding or thromboembolism occur. ? ?Patient verbalized understanding by repeating back information and was advised to contact me if further medication-related questions arise.  ? ?Follow-up ?Return in about 1 week (around 06/08/2021). ? ?[name], PharmD ? ?15 minutes spent face-to-face with the patient during the encounter. 50% of time spent on education, including signs/sx bleeding and clotting, as well as food and drug interactions with warfarin. 50% of time was spent on fingerprick POC INR sample collection,processing, results determination, and documentation ? ?

## 2021-06-01 NOTE — Patient Instructions (Signed)
INR at goal. Continue current weekly dose of 2.5 mg every Mon, Wed, Fri and 5 mg all other days. Recheck INR in 1 week.  ?

## 2021-06-03 ENCOUNTER — Other Ambulatory Visit: Payer: Self-pay

## 2021-06-08 ENCOUNTER — Ambulatory Visit: Payer: Self-pay | Admitting: Pharmacist

## 2021-06-08 DIAGNOSIS — I480 Paroxysmal atrial fibrillation: Secondary | ICD-10-CM | POA: Diagnosis not present

## 2021-06-08 DIAGNOSIS — Z7901 Long term (current) use of anticoagulants: Secondary | ICD-10-CM

## 2021-06-08 DIAGNOSIS — Z9889 Other specified postprocedural states: Secondary | ICD-10-CM

## 2021-06-08 DIAGNOSIS — Z5181 Encounter for therapeutic drug level monitoring: Secondary | ICD-10-CM | POA: Diagnosis not present

## 2021-06-08 LAB — POCT INR: INR: 2.9 (ref 2.0–3.0)

## 2021-06-08 NOTE — Patient Instructions (Signed)
INR at goal. Continue current weekly dose of 2.5 mg every Mon, Wed, Fri and 5 mg all other days. Recheck INR in 1 week. ?

## 2021-06-08 NOTE — Progress Notes (Signed)
Anticoagulation Management ?Robert Lynn is a 71 y.o. male who reports to the clinic for monitoring of warfarin treatment.   ? ?Indication:  Paroxysmal Atrial Fibrillation, H/o mitral valve repair  CHA2DS2 Vasc Score 4 (Age 15-74, HTN hx, Hx of stroke), HAS-BLED 2 (Age>65, Stroke hx)  ?Duration: indefinite ?Supervising physician: Rex Kras ? ?Anticoagulation Clinic Visit History: ? ?Patient does not report signs/symptoms of bleeding or thromboembolism  ? ?Other recent changes: No change in diet, medications, lifestyle ? ?Continues to do well without any noted changes. Maintains Vit K intake of 3-4 serving/week. No new changes ? ?Pt reports that he has been taking metoprolol 50 mg and has been feeling more fatigued and tired. Hrs has been in 50s-40s. Pt documented to be taking metoprolol 25 mg per EMR, but pt reports that his PCP had previously recommended to increase the dose to help with BP management. Home SBP readings have been consistently <115. Pt to start taking metoprolol 25 mg moving forward and observing for improvement in her symptoms.  ? ?Anticoagulation Episode Summary   ? ? Current INR goal:  2.0-3.0  ?TTR:  71.1 % (11.3 mo)  ?Next INR check:  06/15/2021  ?INR from last check:  2.9 (06/08/2021)  ?Weekly max warfarin dose:    ?Target end date:  Indefinite  ?INR check location:    ?Preferred lab:    ?Send INR reminders to:    ? Indications   ?H/O mitral valve repair [Z98.890] ?Monitoring for long-term anticoagulant use [Z51.81 ?Z79.01] ?Paroxysmal atrial fibrillation (HCC) [I48.0] ? ?  ?  ? ? Comments:    ?  ? ?  ? ? ?No Known Allergies ? ?Current Outpatient Medications:  ?  hydrochlorothiazide (HYDRODIURIL) 25 MG tablet, Take 25 mg by mouth daily., Disp: , Rfl:  ?  metoprolol succinate (TOPROL XL) 25 MG 24 hr tablet, Take 1 tablet (25 mg total) by mouth daily., Disp: 90 tablet, Rfl: 3 ?  acetaminophen (TYLENOL) 500 MG tablet, Take 1,000 mg by mouth every 6 (six) hours as needed for moderate pain or  headache., Disp: , Rfl:  ?  beclomethasone (QVAR) 80 MCG/ACT inhaler, Inhale 1 puff into the lungs daily., Disp: , Rfl:  ?  ECHINACEA PO, Take 2 tablets by mouth 4 (four) times daily as needed (cold symptoms)., Disp: , Rfl:  ?  fluticasone (FLONASE) 50 MCG/ACT nasal spray, Place 1 spray into both nostrils daily., Disp: , Rfl:  ?  KLOR-CON M20 20 MEQ tablet, Take 20 mEq by mouth daily., Disp: , Rfl:  ?  loratadine (CLARITIN) 10 MG tablet, Take 10 mg by mouth daily as needed for allergies., Disp: , Rfl:  ?  Multiple Vitamins-Minerals (ZINC PO), Take 1 tablet by mouth daily., Disp: , Rfl:  ?  omeprazole (PRILOSEC) 20 MG capsule, Take 40 mg by mouth daily., Disp: , Rfl:  ?  rosuvastatin (CRESTOR) 10 MG tablet, Take 10 mg by mouth daily., Disp: , Rfl:  ?  sildenafil (VIAGRA) 100 MG tablet, Take 100 mg by mouth daily as needed for erectile dysfunction., Disp: , Rfl:  ?  VITAMIN D PO, Take 1 capsule by mouth daily., Disp: , Rfl:  ?  warfarin (COUMADIN) 5 MG tablet, Take 2.5-5 mg by mouth See admin instructions. Or as directed by coumadin clinic Take 5 mg on Sun, Tues,Thurs, Sat. Take 2.5 mg on Mon, Wed, and Fri, Disp: , Rfl:  ?Past Medical History:  ?Diagnosis Date  ? Allergy   ? Atrial fibrillation ([place])   ?  Heart murmur   ? Hyperlipidemia   ? Hypertension   ? Melanoma ([place])   ? MVP (mitral valve prolapse)   ? Prostate cancer ([place])   ? PVC (premature ventricular contraction)   ? Stroke [facility])   ? ? ?ASSESSMENT ? ?Recent Results: ?The most recent result is correlated with 27.5 mg per week: ? ?Lab Results  ?Component Value Date  ? INR 2.9 06/08/2021  ? INR 2.3 06/01/2021  ? INR 3.0 05/25/2021  ? ? ?Anticoagulation Dosing: ?  ?INR today: Therapeutic.Continues to remain therapeutic on current weekly dose. Pt agreeable to maintain consistent with his Vit K intake. Denies any complains of bleeding or bruising symptoms. Denies any other relevant changes in her diet, medications, or lifestyle. Will continue current weekly dose and  continue close monitoring and follow up as needed.  ? ?PLAN ?Weekly dose was unchanged by  0 % to 27.5 mg per week. Continue current weekly dose of 2.5 mg every Mon, Wed, Fri and 5 mg all other days. Recheck INR in 1 weeks.  ? ?Patient Instructions  ?INR at goal. Continue current weekly dose of 2.5 mg every Mon, Wed, Fri and 5 mg all other days. Recheck INR in 1 week. ?Patient advised to contact clinic or seek medical attention if signs/symptoms of bleeding or thromboembolism occur. ? ?Patient verbalized understanding by repeating back information and was advised to contact me if further medication-related questions arise.  ? ?Follow-up ?Return in about 1 week (around 06/15/2021). ? ?[name], PharmD ? ?15 minutes spent face-to-face with the patient during the encounter. 50% of time spent on education, including signs/sx bleeding and clotting, as well as food and drug interactions with warfarin. 50% of time was spent on fingerprick POC INR sample collection,processing, results determination, and documentation ? ?

## 2021-06-15 LAB — POCT INR: INR: 2.5 (ref 2.0–3.0)

## 2021-06-16 ENCOUNTER — Ambulatory Visit: Payer: Self-pay | Admitting: Pharmacist

## 2021-06-16 DIAGNOSIS — Z7901 Long term (current) use of anticoagulants: Secondary | ICD-10-CM

## 2021-06-16 DIAGNOSIS — I480 Paroxysmal atrial fibrillation: Secondary | ICD-10-CM

## 2021-06-16 DIAGNOSIS — Z5181 Encounter for therapeutic drug level monitoring: Secondary | ICD-10-CM | POA: Diagnosis not present

## 2021-06-16 DIAGNOSIS — Z9889 Other specified postprocedural states: Secondary | ICD-10-CM

## 2021-06-16 NOTE — Progress Notes (Signed)
Anticoagulation Management ?Robert Lynn is a 71 y.o. male who reports to the clinic for monitoring of warfarin treatment.   ? ?Indication:  Paroxysmal Atrial Fibrillation, H/o mitral valve repair  CHA2DS2 Vasc Score 4 (Age 80-74, HTN hx, Hx of stroke), HAS-BLED 2 (Age>65, Stroke hx)  ?Duration: indefinite ?Supervising physician: Rex Kras ? ?Anticoagulation Clinic Visit History: ? ?Patient does not report signs/symptoms of bleeding or thromboembolism  ? ?Other recent changes: No change in diet, medications, lifestyle ? ?Continues to do well without any noted changes. Maintains Vit K intake of 3-4 serving/week. No new changes ? ?Pt reports that he has been doing significantly better since taking metoprolol 25 mg and amlodipine 5 mg. Complains of fatigue and lightheadedness concerns have improved.  Pt to start taking metoprolol 25 mg moving forward and observing for improvement in her symptoms.  ? ?Anticoagulation Episode Summary   ? ? Current INR goal:  2.0-3.0  ?TTR:  71.8 % (11.6 mo)  ?Next INR check:  06/22/2021  ?INR from last check:  2.5 (06/15/2021)  ?Weekly max warfarin dose:    ?Target end date:  Indefinite  ?INR check location:    ?Preferred lab:    ?Send INR reminders to:    ? Indications   ?H/O mitral valve repair [Z98.890] ?Monitoring for long-term anticoagulant use [Z51.81 ?Z79.01] ?Paroxysmal atrial fibrillation (HCC) [I48.0] ? ?  ?  ? ? Comments:    ?  ? ?  ? ? ?No Known Allergies ? ?Current Outpatient Medications:  ?  acetaminophen (TYLENOL) 500 MG tablet, Take 1,000 mg by mouth every 6 (six) hours as needed for moderate pain or headache., Disp: , Rfl:  ?  amLODipine (NORVASC) 5 MG tablet, Take 5 mg by mouth daily., Disp: , Rfl:  ?  beclomethasone (QVAR) 80 MCG/ACT inhaler, Inhale 1 puff into the lungs daily., Disp: , Rfl:  ?  ECHINACEA PO, Take 2 tablets by mouth 4 (four) times daily as needed (cold symptoms)., Disp: , Rfl:  ?  fluticasone (FLONASE) 50 MCG/ACT nasal spray, Place 1 spray into both  nostrils daily., Disp: , Rfl:  ?  hydrochlorothiazide (HYDRODIURIL) 25 MG tablet, Take 25 mg by mouth daily., Disp: , Rfl:  ?  sildenafil (VIAGRA) 100 MG tablet, Take 100 mg by mouth daily as needed for erectile dysfunction., Disp: , Rfl:  ?  VITAMIN D PO, Take 1 capsule by mouth daily., Disp: , Rfl:  ?  warfarin (COUMADIN) 5 MG tablet, Take 2.5-5 mg by mouth See admin instructions. Or as directed by coumadin clinic Take 5 mg on Sun, Tues,Thurs, Sat. Take 2.5 mg on Mon, Wed, and Fri, Disp: , Rfl:  ?  KLOR-CON M20 20 MEQ tablet, Take 20 mEq by mouth daily., Disp: , Rfl:  ?  loratadine (CLARITIN) 10 MG tablet, Take 10 mg by mouth daily as needed for allergies., Disp: , Rfl:  ?  metoprolol succinate (TOPROL XL) 25 MG 24 hr tablet, Take 1 tablet (25 mg total) by mouth daily., Disp: 90 tablet, Rfl: 3 ?  Multiple Vitamins-Minerals (ZINC PO), Take 1 tablet by mouth daily., Disp: , Rfl:  ?  omeprazole (PRILOSEC) 20 MG capsule, Take 40 mg by mouth daily., Disp: , Rfl:  ?  rosuvastatin (CRESTOR) 10 MG tablet, Take 10 mg by mouth daily., Disp: , Rfl:  ?Past Medical History:  ?Diagnosis Date  ? Allergy   ? Atrial fibrillation ([place])   ? Heart murmur   ? Hyperlipidemia   ? Hypertension   ?  Melanoma ([place])   ? MVP (mitral valve prolapse)   ? Prostate cancer ([place])   ? PVC (premature ventricular contraction)   ? Stroke [facility])   ? ? ?ASSESSMENT ? ?Recent Results: ?The most recent result is correlated with 27.5 mg per week: ? ?Lab Results  ?Component Value Date  ? INR 2.5 06/15/2021  ? INR 2.9 06/08/2021  ? INR 2.3 06/01/2021  ? ? ?Anticoagulation Dosing: ?  ?INR today: Therapeutic.Continues to remain therapeutic on current weekly dose. Pt agreeable to maintain consistent with his Vit K intake. Denies any complains of bleeding or bruising symptoms. Denies any other relevant changes in her diet, medications, or lifestyle. Will continue current weekly dose and continue close monitoring and follow up as needed.  ? ?PLAN ?Weekly dose was  unchanged by  0 % to 27.5 mg per week. Continue current weekly dose of 2.5 mg every Mon, Wed, Fri and 5 mg all other days. Recheck INR in 1 weeks.  ? ?Patient Instructions  ?INR at goal. Continue current weekly dose of 2.5 mg every Mon, Wed, Fri and 5 mg all other days. Recheck INR in 1 week.  ?Patient advised to contact clinic or seek medical attention if signs/symptoms of bleeding or thromboembolism occur. ? ?Patient verbalized understanding by repeating back information and was advised to contact me if further medication-related questions arise.  ? ?Follow-up ?Return in about 1 week (around 06/23/2021). ? ?[name], PharmD ? ?15 minutes spent face-to-face with the patient during the encounter. 50% of time spent on education, including signs/sx bleeding and clotting, as well as food and drug interactions with warfarin. 50% of time was spent on fingerprick POC INR sample collection,processing, results determination, and documentation ? ?

## 2021-06-16 NOTE — Patient Instructions (Signed)
INR at goal. Continue current weekly dose of 2.5 mg every Mon, Wed, Fri and 5 mg all other days. Recheck INR in 1 week.  ?

## 2021-06-22 ENCOUNTER — Ambulatory Visit: Payer: Self-pay | Admitting: Pharmacist

## 2021-06-22 DIAGNOSIS — Z9889 Other specified postprocedural states: Secondary | ICD-10-CM | POA: Diagnosis not present

## 2021-06-22 DIAGNOSIS — Z5181 Encounter for therapeutic drug level monitoring: Secondary | ICD-10-CM | POA: Diagnosis not present

## 2021-06-22 DIAGNOSIS — Z7901 Long term (current) use of anticoagulants: Secondary | ICD-10-CM

## 2021-06-22 DIAGNOSIS — I480 Paroxysmal atrial fibrillation: Secondary | ICD-10-CM

## 2021-06-22 LAB — POCT INR: INR: 3.2 — AB (ref 2.0–3.0)

## 2021-06-22 NOTE — Patient Instructions (Signed)
INR slightly above goal. Continue current weekly dose of 2.5 mg every Mon, Wed, Fri and 5 mg all other days. Recheck INR in 1 week.  ?

## 2021-06-22 NOTE — Progress Notes (Signed)
Anticoagulation Management ?Robert Lynn is a 71 y.o. male who reports to the clinic for monitoring of warfarin treatment.   ? ?Indication:  Paroxysmal Atrial Fibrillation, H/o mitral valve repair  CHA2DS2 Vasc Score 4 (Age 42-74, HTN hx, Hx of stroke), HAS-BLED 2 (Age>65, Stroke hx)  ?Duration: indefinite ?Supervising physician: Rex Kras ? ?Anticoagulation Clinic Visit History: ? ?Patient does not report signs/symptoms of bleeding or thromboembolism  ? ?Other recent changes: No change in diet, medications, lifestyle ? ?Continues to do well without any noted changes. Maintains Vit K intake of 3-4 serving/week. Pt reports slight decreased broccoli intake than previous baseline, but planing on returning back to previous baseline ? ?Pt reports that he has been doing significantly better since taking metoprolol 25 mg and amlodipine 5 mg. Complains of fatigue and lightheadedness concerns have improved.  Pt to start taking metoprolol 25 mg moving forward and observing for improvement in her symptoms.  ? ?Anticoagulation Episode Summary   ? ? Current INR goal:  2.0-3.0  ?TTR:  71.6 % (11.8 mo)  ?Next INR check:  06/29/2021  ?INR from last check:  3.2 (06/22/2021)  ?Weekly max warfarin dose:    ?Target end date:  Indefinite  ?INR check location:    ?Preferred lab:    ?Send INR reminders to:    ? Indications   ?H/O mitral valve repair [Z98.890] ?Monitoring for long-term anticoagulant use [Z51.81 ?Z79.01] ?Paroxysmal atrial fibrillation (HCC) [I48.0] ? ?  ?  ? ? Comments:    ?  ? ?  ? ? ?No Known Allergies ? ?Current Outpatient Medications:  ?  acetaminophen (TYLENOL) 500 MG tablet, Take 1,000 mg by mouth every 6 (six) hours as needed for moderate pain or headache., Disp: , Rfl:  ?  amLODipine (NORVASC) 5 MG tablet, Take 5 mg by mouth daily., Disp: , Rfl:  ?  beclomethasone (QVAR) 80 MCG/ACT inhaler, Inhale 1 puff into the lungs daily., Disp: , Rfl:  ?  ECHINACEA PO, Take 2 tablets by mouth 4 (four) times daily as needed  (cold symptoms)., Disp: , Rfl:  ?  fluticasone (FLONASE) 50 MCG/ACT nasal spray, Place 1 spray into both nostrils daily., Disp: , Rfl:  ?  hydrochlorothiazide (HYDRODIURIL) 25 MG tablet, Take 25 mg by mouth daily., Disp: , Rfl:  ?  KLOR-CON M20 20 MEQ tablet, Take 20 mEq by mouth daily., Disp: , Rfl:  ?  loratadine (CLARITIN) 10 MG tablet, Take 10 mg by mouth daily as needed for allergies., Disp: , Rfl:  ?  metoprolol succinate (TOPROL XL) 25 MG 24 hr tablet, Take 1 tablet (25 mg total) by mouth daily., Disp: 90 tablet, Rfl: 3 ?  Multiple Vitamins-Minerals (ZINC PO), Take 1 tablet by mouth daily., Disp: , Rfl:  ?  omeprazole (PRILOSEC) 20 MG capsule, Take 40 mg by mouth daily., Disp: , Rfl:  ?  rosuvastatin (CRESTOR) 10 MG tablet, Take 10 mg by mouth daily., Disp: , Rfl:  ?  sildenafil (VIAGRA) 100 MG tablet, Take 100 mg by mouth daily as needed for erectile dysfunction., Disp: , Rfl:  ?  VITAMIN D PO, Take 1 capsule by mouth daily., Disp: , Rfl:  ?  warfarin (COUMADIN) 5 MG tablet, Take 2.5-5 mg by mouth See admin instructions. Or as directed by coumadin clinic Take 5 mg on Sun, Tues,Thurs, Sat. Take 2.5 mg on Mon, Wed, and Fri, Disp: , Rfl:  ?Past Medical History:  ?Diagnosis Date  ? Allergy   ? Atrial fibrillation ([place])   ?  Heart murmur   ? Hyperlipidemia   ? Hypertension   ? Melanoma ([place])   ? MVP (mitral valve prolapse)   ? Prostate cancer ([place])   ? PVC (premature ventricular contraction)   ? Stroke [facility])   ? ? ?ASSESSMENT ? ?Recent Results: ?The most recent result is correlated with 27.5 mg per week: ? ?Lab Results  ?Component Value Date  ? INR 3.2 (A) 06/22/2021  ? INR 2.5 06/15/2021  ? INR 2.9 06/08/2021  ? ? ?Anticoagulation Dosing: ?  ?INR today: Supratherapeutic.INR slightly above goal in setting of lower than previous baseline Vit K intake. Pt agreeable to maintain consistent with his Vit K intake. Denies any complains of bleeding or bruising symptoms. Denies any other relevant changes in her diet,  medications, or lifestyle. Will continue current weekly dose and continue close monitoring and follow up as needed.  ? ?PLAN ?Weekly dose was unchanged by  0 % to 27.5 mg per week. Continue current weekly dose of 2.5 mg every Mon, Wed, Fri and 5 mg all other days. Recheck INR in 1 weeks.  ? ?Patient Instructions  ?INR slightly above goal. Continue current weekly dose of 2.5 mg every Mon, Wed, Fri and 5 mg all other days. Recheck INR in 1 week.  ?Patient advised to contact clinic or seek medical attention if signs/symptoms of bleeding or thromboembolism occur. ? ?Patient verbalized understanding by repeating back information and was advised to contact me if further medication-related questions arise.  ? ?Follow-up ?Return in about 1 week (around 06/29/2021). ? ?[name], PharmD ? ?15 minutes spent face-to-face with the patient during the encounter. 50% of time spent on education, including signs/sx bleeding and clotting, as well as food and drug interactions with warfarin. 50% of time was spent on fingerprick POC INR sample collection,processing, results determination, and documentation ? ?

## 2021-06-29 LAB — POCT INR: INR: 2.5 (ref 2.0–3.0)

## 2021-07-02 ENCOUNTER — Ambulatory Visit: Payer: Self-pay | Admitting: Pharmacist

## 2021-07-02 DIAGNOSIS — I480 Paroxysmal atrial fibrillation: Secondary | ICD-10-CM | POA: Diagnosis not present

## 2021-07-02 DIAGNOSIS — Z7901 Long term (current) use of anticoagulants: Secondary | ICD-10-CM | POA: Diagnosis not present

## 2021-07-02 DIAGNOSIS — Z9889 Other specified postprocedural states: Secondary | ICD-10-CM

## 2021-07-02 DIAGNOSIS — Z5181 Encounter for therapeutic drug level monitoring: Secondary | ICD-10-CM

## 2021-07-02 NOTE — Progress Notes (Signed)
Anticoagulation Management Robert Lynn is a 71 y.o. male who reports to the clinic for monitoring of warfarin treatment.    Indication:  Paroxysmal Atrial Fibrillation, H/o mitral valve repair  CHA2DS2 Vasc Score 4 (Age 9-74, HTN hx, Hx of stroke), HAS-BLED 2 (Age>65, Stroke hx)  Duration: indefinite Supervising physician: Rex Kras  Anticoagulation Clinic Visit History:  Patient does not report signs/symptoms of bleeding or thromboembolism   Other recent changes: No change in diet, medications, lifestyle  Continues to do well without any noted changes. Maintains Vit K intake of 3-4 serving/week. Vit K intake consisting of broccoli and spinach  Pt reports that he has been doing significantly better since taking metoprolol 25 mg and amlodipine 5 mg. Complains of fatigue and lightheadedness concerns have improved.  Pt to continue taking metoprolol 25 mg and observing for improvement in her symptoms.   Anticoagulation Episode Summary     Current INR goal:  2.0-3.0  TTR:  71.6 % (1 y)  Next INR check:  07/06/2021  INR from last check:  2.5 (06/29/2021)  Weekly max warfarin dose:    Target end date:  Indefinite  INR check location:    Preferred lab:    Send INR reminders to:     Indications   H/O mitral valve repair [Z98.890] Monitoring for long-term anticoagulant use [Z51.81 Z79.01] Paroxysmal atrial fibrillation (HCC) [I48.0]        Comments:           No Known Allergies  Current Outpatient Medications:    acetaminophen (TYLENOL) 500 MG tablet, Take 1,000 mg by mouth every 6 (six) hours as needed for moderate pain or headache., Disp: , Rfl:    amLODipine (NORVASC) 5 MG tablet, Take 5 mg by mouth daily., Disp: , Rfl:    beclomethasone (QVAR) 80 MCG/ACT inhaler, Inhale 1 puff into the lungs daily., Disp: , Rfl:    ECHINACEA PO, Take 2 tablets by mouth 4 (four) times daily as needed (cold symptoms)., Disp: , Rfl:    fluticasone (FLONASE) 50 MCG/ACT nasal spray, Place  1 spray into both nostrils daily., Disp: , Rfl:    hydrochlorothiazide (HYDRODIURIL) 25 MG tablet, Take 25 mg by mouth daily., Disp: , Rfl:    KLOR-CON M20 20 MEQ tablet, Take 20 mEq by mouth daily., Disp: , Rfl:    loratadine (CLARITIN) 10 MG tablet, Take 10 mg by mouth daily as needed for allergies., Disp: , Rfl:    metoprolol succinate (TOPROL XL) 25 MG 24 hr tablet, Take 1 tablet (25 mg total) by mouth daily., Disp: 90 tablet, Rfl: 3   Multiple Vitamins-Minerals (ZINC PO), Take 1 tablet by mouth daily., Disp: , Rfl:    omeprazole (PRILOSEC) 20 MG capsule, Take 40 mg by mouth daily., Disp: , Rfl:    rosuvastatin (CRESTOR) 10 MG tablet, Take 10 mg by mouth daily., Disp: , Rfl:    sildenafil (VIAGRA) 100 MG tablet, Take 100 mg by mouth daily as needed for erectile dysfunction., Disp: , Rfl:    VITAMIN D PO, Take 1 capsule by mouth daily., Disp: , Rfl:    warfarin (COUMADIN) 5 MG tablet, Take 2.5-5 mg by mouth See admin instructions. Or as directed by coumadin clinic Take 5 mg on Sun, Tues,Thurs, Sat. Take 2.5 mg on Mon, Wed, and Fri, Disp: , Rfl:  Past Medical History:  Diagnosis Date   Allergy    Atrial fibrillation ([place])    Heart murmur    Hyperlipidemia    Hypertension  Melanoma ([place])    MVP (mitral valve prolapse)    Prostate cancer (HCC)    PVC (premature ventricular contraction)    Stroke [facility])     ASSESSMENT  Recent Results: The most recent result is correlated with 27.5 mg per week:  Lab Results  Component Value Date   INR 2.5 06/29/2021   INR 3.2 (A) 06/22/2021   INR 2.5 06/15/2021    Anticoagulation Dosing:   INR today: Therapeutic.INR back within therapeutic range following pt returning back to previously consistent baseline Vit K intake. Denies any complains of bleeding or bruising symptoms. Denies any other relevant changes in her diet, medications, or lifestyle. Will continue current weekly dose and continue close monitoring and follow up as needed.    PLAN Weekly dose was unchanged by  0 % to 27.5 mg per week. Continue current weekly dose of 2.5 mg every Mon, Wed, Fri and 5 mg all other days. Recheck INR in 1 weeks.   Patient Instructions  INR at goal. Continue current weekly dose of 2.5 mg every Mon, Wed, Fri and 5 mg all other days. Recheck INR in 1 week.  Patient advised to contact clinic or seek medical attention if signs/symptoms of bleeding or thromboembolism occur.  Patient verbalized understanding by repeating back information and was advised to contact me if further medication-related questions arise.   Follow-up Return in about 4 days (around 07/06/2021).  [name], PharmD  15 minutes spent face-to-face with the patient during the encounter. 50% of time spent on education, including signs/sx bleeding and clotting, as well as food and drug interactions with warfarin. 50% of time was spent on fingerprick POC INR sample collection,processing, results determination, and documentation

## 2021-07-02 NOTE — Patient Instructions (Signed)
INR at goal. Continue current weekly dose of 2.5 mg every Mon, Wed, Fri and 5 mg all other days. Recheck INR in 1 week.

## 2021-07-07 LAB — POCT INR: INR: 2.9 (ref 2–3)

## 2021-07-08 ENCOUNTER — Ambulatory Visit: Payer: Self-pay | Admitting: Pharmacist

## 2021-07-08 DIAGNOSIS — I480 Paroxysmal atrial fibrillation: Secondary | ICD-10-CM

## 2021-07-08 DIAGNOSIS — Z5181 Encounter for therapeutic drug level monitoring: Secondary | ICD-10-CM

## 2021-07-08 DIAGNOSIS — Z9889 Other specified postprocedural states: Secondary | ICD-10-CM | POA: Diagnosis not present

## 2021-07-08 DIAGNOSIS — Z7901 Long term (current) use of anticoagulants: Secondary | ICD-10-CM | POA: Diagnosis not present

## 2021-07-08 NOTE — Progress Notes (Signed)
Patient's INR is within goal, 2.9. 15 minutes spent face-to-face with the patient during the encounter. 50% of time spent on education, including signs/sx bleeding and clotting, as well as food and drug interactions with warfarin. 50% of time was spent on fingerprick POC INR sample collection,processing, results determination, and documentation

## 2021-08-14 DIAGNOSIS — I480 Paroxysmal atrial fibrillation: Secondary | ICD-10-CM | POA: Diagnosis not present

## 2021-08-18 DIAGNOSIS — C61 Malignant neoplasm of prostate: Secondary | ICD-10-CM | POA: Diagnosis not present

## 2021-11-25 ENCOUNTER — Observation Stay
Admission: EM | Admit: 2021-11-25 | Discharge: 2021-11-27 | Disposition: A | Discharge status: Home / Self Care | Payer: BC Managed Care – PPO | Attending: Emergency Medicine | Admitting: Emergency Medicine

## 2021-11-25 ENCOUNTER — Emergency Department: Payer: BC Managed Care – PPO

## 2021-11-25 ENCOUNTER — Other Ambulatory Visit: Payer: Self-pay

## 2021-11-25 DIAGNOSIS — Z8673 Personal history of transient ischemic attack (TIA), and cerebral infarction without residual deficits: Secondary | ICD-10-CM | POA: Diagnosis not present

## 2021-11-25 DIAGNOSIS — Z23 Encounter for immunization: Secondary | ICD-10-CM | POA: Diagnosis not present

## 2021-11-25 DIAGNOSIS — I10 Essential (primary) hypertension: Secondary | ICD-10-CM | POA: Diagnosis not present

## 2021-11-25 DIAGNOSIS — R1031 Right lower quadrant pain: Secondary | ICD-10-CM | POA: Diagnosis not present

## 2021-11-25 DIAGNOSIS — K5730 Diverticulosis of large intestine without perforation or abscess without bleeding: Secondary | ICD-10-CM | POA: Diagnosis not present

## 2021-11-25 DIAGNOSIS — K3580 Unspecified acute appendicitis: Principal | ICD-10-CM | POA: Diagnosis present

## 2021-11-25 DIAGNOSIS — Z85828 Personal history of other malignant neoplasm of skin: Secondary | ICD-10-CM | POA: Insufficient documentation

## 2021-11-25 DIAGNOSIS — Z7901 Long term (current) use of anticoagulants: Secondary | ICD-10-CM | POA: Insufficient documentation

## 2021-11-25 DIAGNOSIS — I4891 Unspecified atrial fibrillation: Secondary | ICD-10-CM | POA: Insufficient documentation

## 2021-11-25 DIAGNOSIS — Z79899 Other long term (current) drug therapy: Secondary | ICD-10-CM | POA: Diagnosis not present

## 2021-11-25 DIAGNOSIS — Z8546 Personal history of malignant neoplasm of prostate: Secondary | ICD-10-CM | POA: Insufficient documentation

## 2021-11-25 DIAGNOSIS — N289 Disorder of kidney and ureter, unspecified: Secondary | ICD-10-CM | POA: Diagnosis not present

## 2021-11-25 LAB — CBC WITH DIFFERENTIAL/PLATELET
Abs Immature Granulocytes: 0.12 10*3/uL — ABNORMAL HIGH (ref 0.00–0.07)
Basophils Absolute: 0 10*3/uL (ref 0.0–0.1)
Basophils Relative: 0 %
Eosinophils Absolute: 0 10*3/uL (ref 0.0–0.5)
Eosinophils Relative: 0 %
HCT: 40.9 % (ref 39.0–52.0)
Hemoglobin: 13.6 g/dL (ref 13.0–17.0)
Immature Granulocytes: 1 %
Lymphocytes Relative: 4 %
Lymphs Abs: 0.7 10*3/uL (ref 0.7–4.0)
MCH: 30.2 pg (ref 26.0–34.0)
MCHC: 33.3 g/dL (ref 30.0–36.0)
MCV: 90.9 fL (ref 80.0–100.0)
Monocytes Absolute: 1.2 10*3/uL — ABNORMAL HIGH (ref 0.1–1.0)
Monocytes Relative: 7 %
Neutro Abs: 15.9 10*3/uL — ABNORMAL HIGH (ref 1.7–7.7)
Neutrophils Relative %: 88 %
Platelets: 232 10*3/uL (ref 150–400)
RBC: 4.5 MIL/uL (ref 4.22–5.81)
RDW: 13.7 % (ref 11.5–15.5)
WBC: 17.9 10*3/uL — ABNORMAL HIGH (ref 4.0–10.5)
nRBC: 0 % (ref 0.0–0.2)

## 2021-11-25 LAB — COMPREHENSIVE METABOLIC PANEL
ALT: 27 U/L (ref 0–44)
AST: 37 U/L (ref 15–41)
Albumin: 4 g/dL (ref 3.5–5.0)
Alkaline Phosphatase: 62 U/L (ref 38–126)
Anion gap: 12 (ref 5–15)
BUN: 19 mg/dL (ref 8–23)
CO2: 25 mmol/L (ref 22–32)
Calcium: 9.5 mg/dL (ref 8.9–10.3)
Chloride: 97 mmol/L — ABNORMAL LOW (ref 98–111)
Creatinine, Ser: 0.83 mg/dL (ref 0.61–1.24)
GFR, Estimated: >60 mL/min (ref >60)
Glucose, Bld: 164 mg/dL — ABNORMAL HIGH (ref 70–99)
Potassium: 3.4 mmol/L — ABNORMAL LOW (ref 3.5–5.1)
Sodium: 134 mmol/L — ABNORMAL LOW (ref 135–145)
Total Bilirubin: 0.6 mg/dL (ref 0.3–1.2)
Total Protein: 7.6 g/dL (ref 6.5–8.1)

## 2021-11-25 LAB — LIPASE, BLOOD: Lipase: 31 U/L (ref 11–51)

## 2021-11-25 LAB — PROTIME-INR
INR: 2.3 — ABNORMAL HIGH (ref 0.8–1.2)
Prothrombin Time: 25 seconds — ABNORMAL HIGH (ref 11.4–15.2)

## 2021-11-25 MED ORDER — METRONIDAZOLE 500 MG/100ML IV SOLN
500.0000 mg | Freq: Once | INTRAVENOUS | Status: AC
  Administered 2021-11-25: 500 mg via INTRAVENOUS
  Filled 2021-11-25: qty 100

## 2021-11-25 MED ORDER — IOHEXOL 300 MG/ML  SOLN
100.0000 mL | Freq: Once | INTRAMUSCULAR | Status: AC | PRN
  Administered 2021-11-25: 100 mL via INTRAVENOUS

## 2021-11-25 MED ORDER — ONDANSETRON HCL 4 MG/2ML IJ SOLN
4.0000 mg | Freq: Four times a day (QID) | INTRAMUSCULAR | Status: DC | PRN
  Administered 2021-11-25: 4 mg via INTRAVENOUS
  Filled 2021-11-25: qty 2

## 2021-11-25 MED ORDER — SODIUM CHLORIDE 0.9 % IV BOLUS
1000.0000 mL | Freq: Once | INTRAVENOUS | Status: AC
  Administered 2021-11-25: 1000 mL via INTRAVENOUS

## 2021-11-25 MED ORDER — SODIUM CHLORIDE 0.9 % IV SOLN
1.0000 g | Freq: Once | INTRAVENOUS | Status: AC
  Administered 2021-11-25: 1 g via INTRAVENOUS
  Filled 2021-11-25: qty 10

## 2021-11-25 MED ORDER — ONDANSETRON HCL 4 MG/2ML IJ SOLN
4.0000 mg | Freq: Once | INTRAMUSCULAR | Status: AC
  Administered 2021-11-25: 4 mg via INTRAVENOUS
  Filled 2021-11-25: qty 2

## 2021-11-25 NOTE — ED Notes (Signed)
Md in with pt.   

## 2021-11-25 NOTE — ED Notes (Signed)
Nausea meds given.  Pt alert  iv fluids infusing

## 2021-11-25 NOTE — ED Provider Triage Note (Signed)
Emergency Medicine Provider Triage Evaluation Note  Robert Lynn , a 71 y.o. male  was evaluated in triage.  Pt complains of severe right lower quadrant pain.  Does still have his appendix.  No fever or chills.  Does have vomiting, no diarrhea.  Review of Systems  Positive:  Negative:   Physical Exam  BP 131/74 (BP Location: Left Arm)   Pulse 67   Resp 18   SpO2 97%  Gen:   Awake, no distress   Resp:  Normal effort  MSK:   Moves extremities without difficulty  Other:  Abdomen tender right lower quadrant  Medical Decision Making  Medically screening exam initiated at 5:29 PM.  Appropriate orders placed.  Arlyn Dunning was informed that the remainder of the evaluation will be completed by another provider, this initial triage assessment does not replace that evaluation, and the importance of remaining in the ED until their evaluation is complete.  Labs, imaging ordered   Versie Starks, PA-C 11/25/21 1730

## 2021-11-25 NOTE — ED Provider Notes (Signed)
[name] Provider Note    Event Date/Time   First MD Initiated Contact with Patient 11/25/21 1935     (approximate)   History   Abdominal Pain   HPI  Robert Lynn is a 71 y.o. male   Past medical history of fibrillation on Coumadin, prior stroke, hyperlipidemia, hypertension who presents to the emergency department with nausea onset this morning which progressed to right lower quadrant pain.  No other symptoms.  He is tender in the right lower quadrant.  Hemodynamics appropriate and no fever.   History was obtained via patient and his spouse who is at bedside      Physical Exam   Triage Vital Signs: ED Triage Vitals  Enc Vitals Group     BP 11/25/21 1724 131/74     Pulse Rate 11/25/21 1724 67     Resp 11/25/21 1724 18     Temp --      Temp Source 11/25/21 1724 Oral     SpO2 11/25/21 1724 97 %     Weight --      Height --      Head Circumference --      Peak Flow --      Pain Score 11/25/21 1732 7     Pain Loc --      Pain Edu? --      Excl. in [place]? --     Most recent vital signs: Vitals:   11/25/21 2000 11/25/21 2033  BP: (!) 141/80 (!) 141/80  Pulse: 69 69  Resp:  18  Temp:  98.1 F (36.7 C)  SpO2: 100% 100%    General: Awake, no distress.  CV:  Good peripheral perfusion.  Resp:  Normal effort.  Abd:  No distention.  Lower quadrant tenderness with some guarding Other:  Toxic appearing, comfortable, awake alert oriented and pleasant   ED Results / Procedures / Treatments   Labs (all labs ordered are listed, but only abnormal results are displayed) Labs Reviewed  COMPREHENSIVE METABOLIC PANEL - Abnormal; Notable for the following components:      Result Value   Sodium 134 (*)    Potassium 3.4 (*)    Chloride 97 (*)    Glucose, Bld 164 (*)    All other components within normal limits  CBC WITH DIFFERENTIAL/PLATELET - Abnormal; Notable for the following components:   WBC 17.9 (*)    Neutro Abs 15.9 (*)     Monocytes Absolute 1.2 (*)    Abs Immature Granulocytes 0.12 (*)    All other components within normal limits  PROTIME-INR - Abnormal; Notable for the following components:   Prothrombin Time 25.0 (*)    INR 2.3 (*)    All other components within normal limits  LIPASE, BLOOD     I reviewed labs and they are notable for leukocytosis 17.9   RADIOLOGY I independently reviewed and interpreted inflammatory changes in right lower quadrant   PROCEDURES:  Critical Care performed: Yes, see critical care procedure note(s)  .Critical Care  Performed by: Lucillie Garfinkel, MD Authorized by: Lucillie Garfinkel, MD   Critical care provider statement:    Critical care time (minutes):  30   Critical care was necessary to treat or prevent imminent or life-threatening deterioration of the following conditions: Appendicitis requiring IV medications.   Critical care was time spent personally by me on the following activities:  Development of treatment plan with patient or surrogate, discussions with consultants, evaluation of patient's response  to treatment, examination of patient, ordering and review of laboratory studies, ordering and review of radiographic studies, ordering and performing treatments and interventions, pulse oximetry, re-evaluation of patient's condition and review of old charts   Care discussed with: admitting provider      [place] ED: Medications  ondansetron (ZOFRAN) injection 4 mg (4 mg Intravenous Given 11/25/21 1800)  iohexol (OMNIPAQUE) 300 MG/ML solution 100 mL (100 mLs Intravenous Contrast Given 11/25/21 1824)  cefTRIAXone (ROCEPHIN) 1 g in sodium chloride 0.9 % 100 mL IVPB (0 g Intravenous Stopped 11/25/21 2012)  metroNIDAZOLE (FLAGYL) IVPB 500 mg (0 mg Intravenous Stopped 11/25/21 2109)  sodium chloride 0.9 % bolus 1,000 mL (1,000 mLs Intravenous New Bag/Given 11/25/21 1941)    Consultants:  I spoke with Dr. Christian Mate of surgery regarding care plan for this  patient.   IMPRESSION / MDM / ASSESSMENT AND PLAN / ED COURSE  I reviewed the triage vital signs and the nursing notes.                              Differential diagnosis includes, but is not limited to, abdominal infection, obstruction,    MDM: Patient with acute appendicitis dilated appendix with surrounding stranding, given IV antibiotics at this time, fluid bolus, talked with Dr. Christian Mate of surgery who will operate on patient tomorrow after INR improves  Patient's presentation is most consistent with acute presentation with potential threat to life or bodily function.       FINAL CLINICAL IMPRESSION(S) / ED DIAGNOSES   Final diagnoses:  Acute appendicitis, unspecified acute appendicitis type     Rx / DC Orders   ED Discharge Orders     None        Note:  This document was prepared using Dragon voice recognition software and may include unintentional dictation errors.    Lucillie Garfinkel, MD 11/25/21 2145

## 2021-11-25 NOTE — ED Triage Notes (Signed)
Pt comes with c/o right sided belly pain that started at 11 today. Pt states nausea and vomiting.

## 2021-11-26 ENCOUNTER — Encounter: Payer: Self-pay | Admitting: Surgery

## 2021-11-26 ENCOUNTER — Other Ambulatory Visit: Payer: Self-pay

## 2021-11-26 ENCOUNTER — Encounter
Admission: EM | Disposition: A | Discharge status: Home / Self Care | Payer: Self-pay | Source: Home / Self Care | Attending: Emergency Medicine

## 2021-11-26 ENCOUNTER — Observation Stay: Payer: BC Managed Care – PPO | Admitting: General Practice

## 2021-11-26 DIAGNOSIS — E785 Hyperlipidemia, unspecified: Secondary | ICD-10-CM | POA: Diagnosis not present

## 2021-11-26 DIAGNOSIS — K3580 Unspecified acute appendicitis: Secondary | ICD-10-CM | POA: Diagnosis present

## 2021-11-26 DIAGNOSIS — I499 Cardiac arrhythmia, unspecified: Secondary | ICD-10-CM | POA: Diagnosis not present

## 2021-11-26 DIAGNOSIS — K35891 Other acute appendicitis without perforation, with gangrene: Secondary | ICD-10-CM | POA: Diagnosis not present

## 2021-11-26 DIAGNOSIS — I10 Essential (primary) hypertension: Secondary | ICD-10-CM | POA: Diagnosis not present

## 2021-11-26 DIAGNOSIS — Z9189 Other specified personal risk factors, not elsewhere classified: Secondary | ICD-10-CM | POA: Insufficient documentation

## 2021-11-26 HISTORY — PX: LAPAROSCOPIC APPENDECTOMY: SHX408

## 2021-11-26 LAB — PROTIME-INR
INR: 1.8 — ABNORMAL HIGH (ref 0.8–1.2)
Prothrombin Time: 21 seconds — ABNORMAL HIGH (ref 11.4–15.2)

## 2021-11-26 SURGERY — APPENDECTOMY, LAPAROSCOPIC
Anesthesia: General | Site: Abdomen

## 2021-11-26 MED ORDER — OXYCODONE HCL 5 MG/5ML PO SOLN: 5.0000 mg | Freq: Once | ORAL | Status: DC | PRN

## 2021-11-26 MED ORDER — FENTANYL CITRATE (PF) 100 MCG/2ML IJ SOLN
INTRAMUSCULAR | Status: AC
  Filled 2021-11-26: qty 2

## 2021-11-26 MED ORDER — MIDAZOLAM HCL 2 MG/2ML IJ SOLN
INTRAMUSCULAR | Status: AC
  Filled 2021-11-26: qty 2

## 2021-11-26 MED ORDER — ROSUVASTATIN CALCIUM 10 MG PO TABS
10.0000 mg | ORAL_TABLET | Freq: Every day | ORAL | Status: DC
  Administered 2021-11-26: 10 mg via ORAL
  Filled 2021-11-26: qty 1

## 2021-11-26 MED ORDER — AMLODIPINE BESYLATE 5 MG PO TABS
5.0000 mg | ORAL_TABLET | Freq: Every day | ORAL | Status: DC
  Administered 2021-11-27: 5 mg via ORAL
  Filled 2021-11-26: qty 1

## 2021-11-26 MED ORDER — PROPOFOL 10 MG/ML IV BOLUS
INTRAVENOUS | Status: AC
  Filled 2021-11-26: qty 20

## 2021-11-26 MED ORDER — ROCURONIUM BROMIDE 100 MG/10ML IV SOLN
INTRAVENOUS | Status: DC | PRN
  Administered 2021-11-26: 50 mg via INTRAVENOUS

## 2021-11-26 MED ORDER — HYDROMORPHONE HCL 1 MG/ML IJ SOLN: 1.0000 mg | INTRAMUSCULAR | Status: DC | PRN

## 2021-11-26 MED ORDER — WARFARIN - PHYSICIAN DOSING INPATIENT: Freq: Every day | Status: DC

## 2021-11-26 MED ORDER — LACTATED RINGERS IV SOLN: INTRAVENOUS | Status: DC | PRN

## 2021-11-26 MED ORDER — METRONIDAZOLE 500 MG/100ML IV SOLN: 500.0000 mg | Freq: Two times a day (BID) | INTRAVENOUS | Status: DC

## 2021-11-26 MED ORDER — LIDOCAINE HCL (PF) 2 % IJ SOLN
INTRAMUSCULAR | Status: AC
  Filled 2021-11-26: qty 5

## 2021-11-26 MED ORDER — ONDANSETRON HCL 4 MG/2ML IJ SOLN
INTRAMUSCULAR | Status: DC | PRN
  Administered 2021-11-26: 4 mg via INTRAVENOUS

## 2021-11-26 MED ORDER — ACETAMINOPHEN 10 MG/ML IV SOLN
1000.0000 mg | Freq: Once | INTRAVENOUS | Status: AC
  Administered 2021-11-26: 1000 mg via INTRAVENOUS

## 2021-11-26 MED ORDER — FENTANYL CITRATE (PF) 100 MCG/2ML IJ SOLN
INTRAMUSCULAR | Status: DC | PRN
  Administered 2021-11-26 (×2): 50 ug via INTRAVENOUS

## 2021-11-26 MED ORDER — WARFARIN SODIUM 5 MG PO TABS
5.0000 mg | ORAL_TABLET | Freq: Every day | ORAL | Status: AC
  Administered 2021-11-26: 5 mg via ORAL
  Filled 2021-11-26: qty 1

## 2021-11-26 MED ORDER — POTASSIUM CHLORIDE CRYS ER 20 MEQ PO TBCR
20.0000 meq | EXTENDED_RELEASE_TABLET | Freq: Two times a day (BID) | ORAL | Status: DC
  Administered 2021-11-26 – 2021-11-27 (×2): 20 meq via ORAL
  Filled 2021-11-26 (×2): qty 1

## 2021-11-26 MED ORDER — KETOROLAC TROMETHAMINE 30 MG/ML IJ SOLN
INTRAMUSCULAR | Status: AC
  Filled 2021-11-26: qty 1

## 2021-11-26 MED ORDER — SUGAMMADEX SODIUM 500 MG/5ML IV SOLN
INTRAVENOUS | Status: AC
  Filled 2021-11-26: qty 5

## 2021-11-26 MED ORDER — LIDOCAINE HCL (CARDIAC) PF 100 MG/5ML IV SOSY
PREFILLED_SYRINGE | INTRAVENOUS | Status: DC | PRN
  Administered 2021-11-26: 50 mg via INTRAVENOUS

## 2021-11-26 MED ORDER — EPHEDRINE 5 MG/ML INJ
INTRAVENOUS | Status: AC
  Filled 2021-11-26: qty 5

## 2021-11-26 MED ORDER — PROPOFOL 10 MG/ML IV BOLUS
INTRAVENOUS | Status: DC | PRN
  Administered 2021-11-26: 130 mg via INTRAVENOUS

## 2021-11-26 MED ORDER — ONDANSETRON HCL 4 MG/2ML IJ SOLN: 4.0000 mg | Freq: Four times a day (QID) | INTRAMUSCULAR | Status: DC | PRN

## 2021-11-26 MED ORDER — EPHEDRINE SULFATE (PRESSORS) 50 MG/ML IJ SOLN
INTRAMUSCULAR | Status: DC | PRN
  Administered 2021-11-26: 5 mg via INTRAVENOUS

## 2021-11-26 MED ORDER — DEXAMETHASONE SODIUM PHOSPHATE 10 MG/ML IJ SOLN
INTRAMUSCULAR | Status: AC
  Filled 2021-11-26: qty 1

## 2021-11-26 MED ORDER — ACETAMINOPHEN 10 MG/ML IV SOLN
INTRAVENOUS | Status: AC
  Filled 2021-11-26: qty 100

## 2021-11-26 MED ORDER — VITAMIN K1 10 MG/ML IJ SOLN
5.0000 mg | Freq: Once | INTRAVENOUS | Status: DC
  Filled 2021-11-26 (×2): qty 0.5

## 2021-11-26 MED ORDER — ROCURONIUM BROMIDE 10 MG/ML (PF) SYRINGE
PREFILLED_SYRINGE | INTRAVENOUS | Status: AC
  Filled 2021-11-26: qty 10

## 2021-11-26 MED ORDER — INFLUENZA VAC A&B SA ADJ QUAD 0.5 ML IM PRSY
0.5000 mL | PREFILLED_SYRINGE | INTRAMUSCULAR | Status: AC
  Administered 2021-11-27: 0.5 mL via INTRAMUSCULAR
  Filled 2021-11-26 (×2): qty 0.5

## 2021-11-26 MED ORDER — SUCCINYLCHOLINE CHLORIDE 200 MG/10ML IV SOSY
PREFILLED_SYRINGE | INTRAVENOUS | Status: AC
  Filled 2021-11-26: qty 10

## 2021-11-26 MED ORDER — OXYCODONE HCL 5 MG PO TABS: 5.0000 mg | ORAL_TABLET | Freq: Once | ORAL | Status: DC | PRN

## 2021-11-26 MED ORDER — METRONIDAZOLE 500 MG/100ML IV SOLN
500.0000 mg | Freq: Two times a day (BID) | INTRAVENOUS | Status: DC
  Administered 2021-11-26: 500 mg via INTRAVENOUS
  Filled 2021-11-26 (×2): qty 100

## 2021-11-26 MED ORDER — BUPIVACAINE-EPINEPHRINE 0.25% -1:200000 IJ SOLN
INTRAMUSCULAR | Status: DC | PRN
  Administered 2021-11-26: 9 mL

## 2021-11-26 MED ORDER — ONDANSETRON HCL 4 MG/2ML IJ SOLN
INTRAMUSCULAR | Status: AC
  Filled 2021-11-26: qty 2

## 2021-11-26 MED ORDER — BUPIVACAINE-EPINEPHRINE (PF) 0.25% -1:200000 IJ SOLN
INTRAMUSCULAR | Status: AC
  Filled 2021-11-26: qty 30

## 2021-11-26 MED ORDER — HYDROCHLOROTHIAZIDE 25 MG PO TABS
25.0000 mg | ORAL_TABLET | Freq: Every day | ORAL | Status: DC
  Administered 2021-11-27: 25 mg via ORAL
  Filled 2021-11-26: qty 1

## 2021-11-26 MED ORDER — MIDAZOLAM HCL 2 MG/2ML IJ SOLN
INTRAMUSCULAR | Status: DC | PRN
  Administered 2021-11-26: 2 mg via INTRAVENOUS

## 2021-11-26 MED ORDER — VITAMIN K1 10 MG/ML IJ SOLN
5.0000 mg | Freq: Once | INTRAVENOUS | Status: AC
  Administered 2021-11-26: 5 mg via INTRAVENOUS
  Filled 2021-11-26: qty 0.5

## 2021-11-26 MED ORDER — KETOROLAC TROMETHAMINE 30 MG/ML IJ SOLN
INTRAMUSCULAR | Status: DC | PRN
  Administered 2021-11-26: 15 mg via INTRAVENOUS

## 2021-11-26 MED ORDER — HYDROMORPHONE HCL 1 MG/ML IJ SOLN: 0.2500 mg | INTRAMUSCULAR | Status: DC | PRN

## 2021-11-26 MED ORDER — DEXAMETHASONE SODIUM PHOSPHATE 10 MG/ML IJ SOLN
INTRAMUSCULAR | Status: DC | PRN
  Administered 2021-11-26: 8 mg via INTRAVENOUS

## 2021-11-26 MED ORDER — SUGAMMADEX SODIUM 500 MG/5ML IV SOLN
INTRAVENOUS | Status: DC | PRN
  Administered 2021-11-26: 300 mg via INTRAVENOUS

## 2021-11-26 MED ORDER — SODIUM CHLORIDE 0.9 % IV SOLN
2.0000 g | INTRAVENOUS | Status: DC
  Administered 2021-11-26: 2 g via INTRAVENOUS
  Filled 2021-11-26: qty 20

## 2021-11-26 MED ORDER — ONDANSETRON 4 MG PO TBDP: 4.0000 mg | ORAL_TABLET | Freq: Four times a day (QID) | ORAL | Status: DC | PRN

## 2021-11-26 MED ORDER — SODIUM CHLORIDE 0.9 % IV SOLN: 2.0000 g | INTRAVENOUS | Status: DC

## 2021-11-26 MED ORDER — METOPROLOL SUCCINATE ER 25 MG PO TB24
25.0000 mg | ORAL_TABLET | Freq: Every day | ORAL | Status: DC
  Administered 2021-11-27: 25 mg via ORAL
  Filled 2021-11-26: qty 1

## 2021-11-26 SURGICAL SUPPLY — 29 items
BLADE CLIPPER SURG (BLADE) ×1 IMPLANT
CUTTER FLEX LINEAR 45M (STAPLE) ×1 IMPLANT
DERMABOND ADVANCED .7 DNX12 (GAUZE/BANDAGES/DRESSINGS) ×1 IMPLANT
ELECT REM PT RETURN 9FT ADLT (ELECTROSURGICAL) ×1
ELECTRODE REM PT RTRN 9FT ADLT (ELECTROSURGICAL) ×1 IMPLANT
GLOVE ORTHO TXT STRL SZ7.5 (GLOVE) ×1 IMPLANT
GOWN STRL REUS W/ TWL LRG LVL3 (GOWN DISPOSABLE) ×1 IMPLANT
GOWN STRL REUS W/ TWL XL LVL3 (GOWN DISPOSABLE) ×1 IMPLANT
GOWN STRL REUS W/TWL LRG LVL3 (GOWN DISPOSABLE) ×1
GOWN STRL REUS W/TWL XL LVL3 (GOWN DISPOSABLE) ×1
GRASPER SUT TROCAR 14GX15 (MISCELLANEOUS) IMPLANT
KIT TURNOVER KIT A (KITS) ×1 IMPLANT
NEEDLE HYPO 22GX1.5 SAFETY (NEEDLE) ×1 IMPLANT
NS IRRIG 500ML POUR BTL (IV SOLUTION) ×1 IMPLANT
PACK LAP CHOLECYSTECTOMY (MISCELLANEOUS) ×1 IMPLANT
PENCIL SMOKE EVACUATOR (MISCELLANEOUS) IMPLANT
RELOAD 45 VASCULAR/THIN (ENDOMECHANICALS) ×1 IMPLANT
RELOAD STAPLE 45 2.5 WHT GRN (ENDOMECHANICALS) ×1 IMPLANT
SET TUBE SMOKE EVAC HIGH FLOW (TUBING) ×1 IMPLANT
SHEARS HARMONIC ACE PLUS 36CM (ENDOMECHANICALS) ×1 IMPLANT
SLEEVE Z-THREAD 5X100MM (TROCAR) ×1 IMPLANT
SUT MNCRL 4-0 (SUTURE) ×1
SUT MNCRL 4-0 27XMFL (SUTURE) ×1
SUT VICRYL 0 AB UR-6 (SUTURE) IMPLANT
SUTURE MNCRL 4-0 27XMF (SUTURE) ×1 IMPLANT
SYS BAG RETRIEVAL 10MM (BASKET) ×1
SYSTEM BAG RETRIEVAL 10MM (BASKET) ×1 IMPLANT
TROCAR ADV FIXATION 12X100MM (TROCAR) ×1 IMPLANT
TROCAR Z-THREAD OPTICAL 5X100M (TROCAR) ×1 IMPLANT

## 2021-11-26 NOTE — Anesthesia Procedure Notes (Signed)
Procedure Name: Intubation Date/Time: 11/26/2021 12:20 PM  Performed by: Jerrye Noble, CRNAPre-anesthesia Checklist: Patient identified, Emergency Drugs available, Suction available and Patient being monitored Patient Re-evaluated:Patient Re-evaluated prior to induction Oxygen Delivery Method: Circle system utilized Preoxygenation: Pre-oxygenation with 100% oxygen Induction Type: IV induction Ventilation: Mask ventilation without difficulty Laryngoscope Size: McGraph and 3 Grade View: Grade I Tube type: Oral Tube size: 7.0 mm Number of attempts: 1 Airway Equipment and Method: Stylet and Video-laryngoscopy Placement Confirmation: ETT inserted through vocal cords under direct vision, positive ETCO2 and breath sounds checked- equal and bilateral Secured at: 22 cm Tube secured with: Tape Dental Injury: Teeth and Oropharynx as per pre-operative assessment

## 2021-11-26 NOTE — ED Notes (Signed)
Pt sleeping. 

## 2021-11-26 NOTE — H&P (Signed)
Patient ID: Robert Lynn, male   DOB: 1950-12-15, 71 y.o.   MRN: [id number]  Chief Complaint: Right lower quadrant pain  History of Present Illness Robert Lynn is a 71 y.o. male with cute onset of right lower quadrant pain beginning this morning, persisting throughout the day, associated nausea.  Currently on Coumadin for atrial fibrillation history, prior stroke.  He denies fevers and chills.  Denies dysuria.  Past Medical History Past Medical History:  Diagnosis Date   Allergy    Atrial fibrillation (HCC)    Heart murmur    Hyperlipidemia    Hypertension    Melanoma ([place])    MVP (mitral valve prolapse)    Prostate cancer ([place])    PVC (premature ventricular contraction)    Stroke [facility])       Past Surgical History:  Procedure Laterality Date   MITRAL VALVE REPAIR  2002   RIGHT/LEFT HEART CATH AND CORONARY ANGIOGRAPHY N/A 10/28/2020   Procedure: RIGHT/LEFT HEART CATH AND CORONARY ANGIOGRAPHY;  Surgeon: Adrian Prows, MD;  Location: [place] [facility];  Service: Cardiovascular;  Laterality: N/A;    No Known Allergies  Current Facility-Administered Medications  Medication Dose Route Frequency Provider Last Rate Last Admin   cefTRIAXone (ROCEPHIN) 2 g in sodium chloride 0.9 % 100 mL IVPB  2 g Intravenous Q24H Belue, Alver Sorrow, RPH       And   metroNIDAZOLE (FLAGYL) IVPB 500 mg  500 mg Intravenous Q12H [place]ue, Alver Sorrow, RPH       HYDROmorphone (DILAUDID) injection 1 mg  1 mg Intravenous Q3H PRN Ronny Bacon, MD       ondansetron (ZOFRAN-ODT) disintegrating tablet 4 mg  4 mg Oral Q6H PRN Ronny Bacon, MD       Or   ondansetron [place]) injection 4 mg  4 mg Intravenous Q6H PRN Ronny Bacon, MD       Current Outpatient Medications  Medication Sig Dispense Refill   acetaminophen (TYLENOL) 500 MG tablet Take 1,000 mg by mouth every 6 (six) hours as needed for moderate pain or headache.     amLODipine (NORVASC) 5 MG tablet Take 5 mg by mouth daily.     beclomethasone  (QVAR) 80 MCG/ACT inhaler Inhale 1 puff into the lungs daily.     ECHINACEA PO Take 2 tablets by mouth 4 (four) times daily as needed (cold symptoms).     fluticasone (FLONASE) 50 MCG/ACT nasal spray Place 1 spray into both nostrils daily.     hydrochlorothiazide (HYDRODIURIL) 25 MG tablet Take 25 mg by mouth daily.     KLOR-CON M20 20 MEQ tablet Take 20 mEq by mouth daily.     loratadine (CLARITIN) 10 MG tablet Take 10 mg by mouth daily as needed for allergies.     metoprolol succinate (TOPROL XL) 25 MG 24 hr tablet Take 1 tablet (25 mg total) by mouth daily. 90 tablet 3   Multiple Vitamins-Minerals (ZINC PO) Take 1 tablet by mouth daily.     omeprazole (PRILOSEC) 20 MG capsule Take 40 mg by mouth daily.     rosuvastatin (CRESTOR) 10 MG tablet Take 10 mg by mouth daily.     sildenafil (VIAGRA) 100 MG tablet Take 100 mg by mouth daily as needed for erectile dysfunction.     VITAMIN D PO Take 1 capsule by mouth daily.     warfarin (COUMADIN) 5 MG tablet Take 2.5-5 mg by mouth See admin instructions. Or as directed by coumadin clinic Take 5 mg  on Sun, Tues,Thurs, Sat. Take 2.5 mg on Mon, Wed, and Fri      Family History Family History  Adopted: Yes  Problem Relation Age of Onset   Dementia Mother    Snoring Father    Emphysema Father       Social History Social History   Tobacco Use   Smoking status: Never   Smokeless tobacco: Never  Vaping Use   Vaping Use: Never used  Substance Use Topics   Alcohol use: No    Alcohol/week: 0.0 standard drinks of alcohol   Drug use: No         Physical Exam Blood pressure 120/65, pulse 74, temperature 98.1 F (36.7 C), temperature source Oral, resp. rate 18, SpO2 96 %.   CONSTITUTIONAL: Well developed, and nourished, appropriately responsive and aware without distress.   EYES: Sclera non-icteric.   EARS, NOSE, MOUTH AND THROAT:  The oropharynx is clear. Oral mucosa is pink and moist.   Hearing is intact to voice.  NECK: Trachea is  midline.  LYMPH NODES:  Lymph nodes in the neck are not enlarged. RESPIRATORY:  Normal respiratory effort without pathologic use of accessory muscles. CARDIOVASCULAR: Heart is regular in rate and rhythm. GI: The abdomen is notable for right lower quadrant tenderness with guarding.  Otherwise soft, nontender, and nondistended. There is a small readily reducible umbilical hernia defect.  SKIN: Skin turgor is normal. No pathologic skin lesions appreciated.  NEUROLOGIC: Nonfocal, no gross deficits. PSYCH:  Alert and oriented to person, place and time. Affect is appropriate for situation.  Data Reviewed I have personally reviewed what is currently available of the patient's imaging, recent labs and medical records.   Labs:     Latest Ref Rng & Units 11/25/2021    5:30 PM 10/28/2020    2:08 PM 10/28/2020    2:03 PM  CBC  WBC 4.0 - 10.5 K/uL 17.9     Hemoglobin 13.0 - 17.0 g/dL 13.6  13.3  13.3    13.6   Hematocrit 39.0 - 52.0 % 40.9  39.0  39.0    40.0   Platelets 150 - 400 K/uL 232         Latest Ref Rng & Units 11/25/2021    5:30 PM 10/28/2020    2:08 PM 10/28/2020    2:03 PM  CMP  Glucose 70 - 99 mg/dL 164     BUN 8 - 23 mg/dL 19     Creatinine 0.61 - 1.24 mg/dL 0.83     Sodium 135 - 145 mmol/L 134  143  143    143   Potassium 3.5 - 5.1 mmol/L 3.4  3.6  3.5    3.5   Chloride 98 - 111 mmol/L 97     CO2 22 - 32 mmol/L 25     Calcium 8.9 - 10.3 mg/dL 9.5     Total Protein 6.5 - 8.1 g/dL 7.6     Total Bilirubin 0.3 - 1.2 mg/dL 0.6     Alkaline Phos 38 - 126 U/L 62     AST 15 - 41 U/L 37     ALT 0 - 44 U/L 27         Imaging:  Within last 24 hrs: CT ABDOMEN PELVIS W CONTRAST  Result Date: 11/25/2021 CLINICAL DATA:  Pain right lower quadrant of abdomen EXAM: CT ABDOMEN AND PELVIS WITH CONTRAST TECHNIQUE: Multidetector CT imaging of the abdomen and pelvis was performed using the standard protocol following bolus  administration of intravenous contrast. RADIATION DOSE  REDUCTION: This exam was performed according to the departmental dose-optimization program which includes automated exposure control, adjustment of the mA and/or kV according to patient size and/or use of iterative reconstruction technique. CONTRAST:  126m OMNIPAQUE IOHEXOL 300 MG/ML  SOLN COMPARISON:  None Available. FINDINGS: Lower chest: There is metallic density in mitral annulus, possibly prosthetic valve. Metallic sutures are seen in sternum. Heart is enlarged in size. There are small linear densities in the lower lung fields suggesting scarring or subsegmental atelectasis. Hepatobiliary: There is fatty infiltration. There are few low-density lesions each measuring less than 1.6 cm, possibly cysts or hemangiomas. There is no dilation of bile ducts. Gallbladder is unremarkable. Pancreas: No focal abnormalities are seen. Spleen: Unremarkable. Adrenals/Urinary Tract: Adrenals are unremarkable. There is no hydronephrosis. There are no renal or ureteral stones. There are few low-density lesions in the kidneys largest measuring 1.6 cm in the midportion of right kidney. Density measurements are less than 25 Hounsfield units suggesting renal cysts. Ureters are not dilated. Urinary bladder is not distended. There is mild diffuse wall thickening in the bladder. This may be due to incomplete distention or cystitis. Stomach/Bowel: Small hiatal hernia is seen. Stomach is not distended. Small bowel loops are not dilated. Appendix is dilated measuring up to 1.4 cm in diameter. There is stranding in the fat planes adjacent to the appendix. There is no loculated pericecal fluid collection. Scattered diverticula are seen in colon without signs of focal acute diverticulitis. Vascular/Lymphatic: Scattered arterial calcifications are seen. There is 2.4 cm ectasia in infrarenal aorta. No follow-up imaging is recommended. Reproductive: Prostate is enlarged. Other: There is no ascites or pneumoperitoneum. Umbilical hernia containing  fat is seen. Small bilateral inguinal hernias containing fat are noted. Musculoskeletal: No acute findings are seen. IMPRESSION: There is abnormal dilation of appendix along with adjacent inflammatory stranding consistent with acute appendicitis. There is no loculated pericecal abscess. There is no ascites or pneumoperitoneum. There is no evidence of intestinal obstruction or pneumoperitoneum. There is no hydronephrosis. Fatty liver. Few small low-density lesions in liver suggesting possible cysts or hemangiomas. Small hiatal hernia. Small bilateral renal cysts. Diverticulosis of colon without signs of focal diverticulitis. There is mild diffuse wall thickening in the urinary bladder which may be due to incomplete distention or cystitis. Enlarged prostate. Aortic atherosclerotic changes. Other findings as described in the body of the report. Electronically Signed   By: PElmer PickerM.D.   On: 11/25/2021 18:56    Assessment     Patient Active Problem List   Diagnosis Date Noted   Acute appendicitis 11/26/2021   Abnormal nuclear stress test 10/28/2020   Dyspnea on exertion 10/27/2020   Monitoring for long-term anticoagulant use 06/23/2020   Paroxysmal atrial fibrillation ([place] 06/23/2020   H/O mitral valve repair    History of mitral valve prolapse     Plan    Laparoscopic appendectomy. The risks, benefits, complications, treatment options, and expected outcomes were discussed with the patient. The treatment of antibiotics alone was discussed giving a 20% chance that this could fail and surgery would be necessary.  Also discussed continuing to the operating room for Laparoscopic Appendectomy.  The possibilities of  bleeding, recurrent infection, perforation of viscus, finding a normal appendix, the need for additional procedures, failure to diagnose a condition, conversion to open procedure and creating a complication requiring transfusion or further operations were discussed. The patient was  given the opportunity to ask questions and have them answered.  Patient  would like to proceed with Laparoscopic Appendectomy and consent was obtained.     Latest Ref Rng & Units 11/25/2021    5:30 PM 10/28/2020    2:08 PM 10/28/2020    2:03 PM  CBC  WBC 4.0 - 10.5 K/uL 17.9     Hemoglobin 13.0 - 17.0 g/dL 13.6  13.3  13.3    13.6   Hematocrit 39.0 - 52.0 % 40.9  39.0  39.0    40.0   Platelets 150 - 400 K/uL 232        Face-to-face time spent with the patient and accompanying care providers(if present) was 40 minutes, with more than 50% of the time spent counseling, educating, and coordinating care of the patient.    These notes generated with voice recognition software. I apologize for typographical errors.  Ronny Bacon M.D., FACS 11/26/2021, 2:06 AM

## 2021-11-26 NOTE — Op Note (Signed)
Laparascopic appendectomy   Robert Lynn Date of operation:  11/26/2021  Indications: The patient presented with a history of  abdominal pain. Workup has revealed findings consistent with acute appendicitis.  Pre-operative Diagnosis: Acute appendicitis without mention of peritonitis  Post-operative Diagnosis: Acute appendicitis without mention of peritonitis  Surgeon: Ronny Bacon, M.D., FACS  Anesthesia: General with endotracheal tube  Findings: Exudative appendicitis, no appreciable gangrene, perforation or altered peritoneal fluid.  Estimated Blood Loss: 5 mL         Specimens: appendix         Complications: None.  Procedure Details  The patient was seen again in the preop area. The options of surgery versus observation were reviewed with the patient and/or family. The risks of bleeding, infection, recurrence of symptoms, negative laparoscopy, potential for an open procedure, bowel injury, abscess or infection, were all reviewed as well. The patient was taken to Operating Room, identified as Robert Lynn and the procedure verified as laparoscopic appendectomy. A Time Out was held and the above information confirmed. The patient was placed in the supine position and general anesthesia was induced.  Antibiotic prophylaxis was administered pre-op and VT E prophylaxis was in place.   The abdomen was prepped and draped in a sterile fashion. Local infiltration with 0.25% Marcaine with epi is administered to all incisions.  An umbilical incision was made.  I dissected the dermal fascial junction overlying the umbilical fascial defect sharply, and a 5 mm optical trocar was passed into the peritoneal cavity under direct visualization.  Pneumoperitoneum obtained. One 12 mm port in the LLQ, and another 5 mm port was placed in the suprapubic area under direct visualization.  The appendix was identified.  The appendix was carefully dissected. The mesoappendix was divided with Harmonic  scalpel. The base of the appendix was dissected out and divided with a 45 mm white load Endo GIA.The appendix was placed in a Endo Catch bag and removed via the 12 mm LLQ port. The right lower quadrant and pelvis was then irrigated with  normal saline which was aspirated. Inspection  failed to identify any additional bleeding and there were no signs of bowel injury. Again the right lower quadrant was inspected there was no sign of bleeding or bowel injury. The LLQ fascia was closed with 0 Vicryl, the umbilical fascial defect was closed in like manner, pneumoperitoneum was released, all ports were removed, and the skin incisions were approximated with subcuticular 4-0 Monocryl. Dermabond was applied.  The patient tolerated the procedure well, there were no complications. The sponge lap and needle count were correct at the end of the procedure.  The patient was taken to the recovery room in stable condition to be discharged to home, probably in the morning, when appropriate.   Ronny Bacon, M.D., FACS 11/26/2021 - 2:09 PM

## 2021-11-26 NOTE — Progress Notes (Signed)
Patient awake/alert x4. Tolerating po fluids.  States he does not want any pain medications, only tylenol.  IV tylenol administered in pacu.

## 2021-11-26 NOTE — TOC Initial Note (Signed)
Transition of Care [facility]) - Initial/Assessment Note    Patient Details  Name: Robert Lynn MRN: [id number] Date of Birth: 08-06-1950  Transition of Care [facility]) CM/SW Contact:    Conception Oms, RN Phone Number: 11/26/2021, 11:32 AM  Clinical Narrative:                  Transition of Care [facility]) Screening Note   Patient Details  Name: Robert Lynn Date of Birth: [birth year]   Transition of Care [facility]) CM/SW Contact:    Conception Oms, RN Phone Number: 11/26/2021, 11:32 AM    Transition of Care Department [facility]) has reviewed patient and no TOC needs have been identified at this time. We will continue to monitor patient advancement through interdisciplinary progression rounds. If new patient transition needs arise, please place a TOC consult.    Expected Discharge Plan: Home/Self Care Barriers to Discharge: Continued Medical Work up   Patient Goals and CMS Choice        Expected Discharge Plan and Services Expected Discharge Plan: Home/Self Care       Living arrangements for the past 2 months: Single Family Home                                      Prior Living Arrangements/Services Living arrangements for the past 2 months: Single Family Home Lives with:: Spouse                   Activities of Daily Living Home Assistive Devices/Equipment: None ADL Screening (condition at time of admission) Patient's cognitive ability adequate to safely complete daily activities?: No Is the patient deaf or have difficulty hearing?: No Does the patient have difficulty seeing, even when wearing glasses/contacts?: No Does the patient have difficulty concentrating, remembering, or making decisions?: No Patient able to express need for assistance with ADLs?: Yes Does the patient have difficulty dressing or bathing?: No Independently performs ADLs?: Yes (appropriate for developmental age) Does the patient have difficulty walking or climbing stairs?:  No Weakness of Legs: None Weakness of Arms/Hands: None  Permission Sought/Granted                  Emotional Assessment              Admission diagnosis:  Acute appendicitis [K35.80] Acute appendicitis, unspecified acute appendicitis type [K35.80] Patient Active Problem List   Diagnosis Date Noted   Acute appendicitis 11/26/2021   At risk for bleeding associated with anticoagulants    Abnormal nuclear stress test 10/28/2020   Dyspnea on exertion 10/27/2020   Monitoring for long-term anticoagulant use 06/23/2020   Paroxysmal atrial fibrillation ([place]) 06/23/2020   H/O mitral valve repair    History of mitral valve prolapse    PCP:  Leeroy Cha, MD Pharmacy:   [id number] [place] [address] [id number]: [phone] Fax: [phone]    Social Determinants of Health (SDOH) Interventions    Readmission Risk Interventions     No data to display

## 2021-11-26 NOTE — Anesthesia Postprocedure Evaluation (Signed)
Anesthesia Post Note  Patient: Robert Lynn  Procedure(s) Performed: APPENDECTOMY LAPAROSCOPIC (Abdomen)  Patient location during evaluation: PACU Anesthesia Type: General Level of consciousness: awake and alert Pain management: pain level controlled Vital Signs Assessment: post-procedure vital signs reviewed and stable Respiratory status: spontaneous breathing, nonlabored ventilation and respiratory function stable Cardiovascular status: blood pressure returned to baseline and stable Postop Assessment: no apparent nausea or vomiting Anesthetic complications: no   No notable events documented.   Last Vitals:  Vitals:   11/26/21 1348 11/26/21 1400  BP:  117/70  Pulse: 60 (!) 59  Resp: 18 13  Temp:  (!) 36.2 C  SpO2: 96% 96%    Last Pain:  Vitals:   11/26/21 1400  TempSrc:   PainSc: 0-No pain                 [place] Ouch

## 2021-11-26 NOTE — Transfer of Care (Signed)
Immediate Anesthesia Transfer of Care Note  Patient: Robert Lynn  Procedure(s) Performed: APPENDECTOMY LAPAROSCOPIC (Abdomen)  Patient Location: PACU  Anesthesia Type:General  Level of Consciousness: drowsy and patient cooperative  Airway & Oxygen Therapy: Patient Spontanous Breathing and Patient connected to face mask oxygen  Post-op Assessment: Report given to RN and Post -op Vital signs reviewed and stable  Post vital signs: Reviewed and stable  Last Vitals:  Vitals Value Taken Time  BP 126/74 11/26/21 1316  Temp    Pulse 63 11/26/21 1318  Resp 13 11/26/21 1318  SpO2 98 % 11/26/21 1318  Vitals shown include unvalidated device data.  Last Pain:  Vitals:   11/26/21 1158  TempSrc: Temporal  PainSc: 2       Patients Stated Pain Goal: 0 (08/81/10 3159)  Complications: No notable events documented.

## 2021-11-26 NOTE — Anesthesia Preprocedure Evaluation (Signed)
Anesthesia Evaluation  Patient identified by MRN, date of birth, ID band Patient awake    Reviewed: Allergy & Precautions, NPO status , Patient's Chart, lab work & pertinent test results  History of Anesthesia Complications Negative for: history of anesthetic complications  Airway Mallampati: II  TM Distance: >3 FB Neck ROM: full    Dental  (+) Teeth Intact   Pulmonary neg pulmonary ROS,    Pulmonary exam normal        Cardiovascular hypertension, On Medications Normal cardiovascular exam+ dysrhythmias Atrial Fibrillation + Valvular Problems/Murmurs (s.p MV repair ) MVP      Neuro/Psych CVA negative psych ROS   GI/Hepatic negative GI ROS, Neg liver ROS,   Endo/Other  negative endocrine ROS  Renal/GU      Musculoskeletal   Abdominal   Peds  Hematology negative hematology ROS (+)   Anesthesia Other Findings Past Medical History: No date: Allergy No date: Atrial fibrillation (HCC) No date: Heart murmur No date: Hyperlipidemia No date: Hypertension No date: Melanoma ([place]) No date: MVP (mitral valve prolapse) No date: Prostate cancer ([place]) No date: PVC (premature ventricular contraction) No date: Stroke [facility])  Past Surgical History: 2002: MITRAL VALVE REPAIR 10/28/2020: RIGHT/LEFT HEART CATH AND CORONARY ANGIOGRAPHY; N/A     Comment:  Procedure: RIGHT/LEFT HEART CATH AND CORONARY               ANGIOGRAPHY;  Surgeon: Adrian Prows, MD;  Location: [facility];  Service: Cardiovascular;  Laterality:               N/A;     Reproductive/Obstetrics negative OB ROS                           Anesthesia Physical Anesthesia Plan  ASA: 3  Anesthesia Plan: General ETT   Post-op Pain Management: Dilaudid IV, Ofirmev IV (intra-op)* and Toradol IV (intra-op)*   Induction: Intravenous  PONV Risk Score and Plan: 3 and Ondansetron, Dexamethasone and Treatment may vary due to  age or medical condition  Airway Management Planned: Oral ETT  Additional Equipment:   Intra-op Plan:   Post-operative Plan: Extubation in OR  Informed Consent: I have reviewed the patients History and Physical, chart, labs and discussed the procedure including the risks, benefits and alternatives for the proposed anesthesia with the patient or authorized representative who has indicated his/her understanding and acceptance.     Dental Advisory Given  Plan Discussed with: Anesthesiologist, CRNA and Surgeon  Anesthesia Plan Comments: (Patient consented for risks of anesthesia including but not limited to:  - adverse reactions to medications - damage to eyes, teeth, lips or other oral mucosa - nerve damage due to positioning  - sore throat or hoarseness - Damage to heart, brain, nerves, lungs, other parts of body or loss of life  Patient voiced understanding.)       Anesthesia Quick Evaluation

## 2021-11-26 NOTE — Plan of Care (Signed)
  Problem: Education: Goal: Knowledge of General Education information will improve Description: Including pain rating scale, medication(s)/side effects and non-pharmacologic comfort measures Outcome: Progressing   Problem: Clinical Measurements: Goal: Respiratory complications will improve Outcome: Progressing   Problem: Nutrition: Goal: Adequate nutrition will be maintained Outcome: Progressing   Problem: Activity: Goal: Risk for activity intolerance will decrease Outcome: Progressing   Problem: Coping: Goal: Level of anxiety will decrease Outcome: Progressing   Problem: Elimination: Goal: Will not experience complications related to urinary retention Outcome: Progressing   Problem: Elimination: Goal: Will not experience complications related to bowel motility Outcome: Progressing

## 2021-11-27 ENCOUNTER — Encounter: Payer: Self-pay | Admitting: Surgery

## 2021-11-27 LAB — PROTIME-INR
INR: 1.3 — ABNORMAL HIGH (ref 0.8–1.2)
Prothrombin Time: 16 seconds — ABNORMAL HIGH (ref 11.4–15.2)

## 2021-11-27 LAB — HIV ANTIBODY (ROUTINE TESTING W REFLEX): HIV Screen 4th Generation wRfx: NONREACTIVE

## 2021-11-27 LAB — SURGICAL PATHOLOGY

## 2021-11-27 MED ORDER — OXYCODONE HCL 5 MG PO TABS: 5.0000 mg | ORAL_TABLET | Freq: Four times a day (QID) | ORAL | 0 refills | Status: DC | PRN

## 2021-11-27 NOTE — Progress Notes (Signed)
Patient was given verbal and written discharge instruction, he states he will comply , flu shot was given, iv was removed with 2x2 gauze applied, patient was taken to car by wheelchair, no distress when leaving the floor.

## 2021-11-27 NOTE — Plan of Care (Signed)
  Problem: Education: Goal: Knowledge of General Education information will improve Description: Including pain rating scale, medication(s)/side effects and non-pharmacologic comfort measures Outcome: Progressing   Problem: Clinical Measurements: Goal: Will remain free from infection Outcome: Progressing   Problem: Nutrition: Goal: Adequate nutrition will be maintained Outcome: Progressing   Problem: Elimination: Goal: Will not experience complications related to bowel motility Outcome: Progressing   Problem: Safety: Goal: Ability to remain free from injury will improve Outcome: Progressing   Problem: Skin Integrity: Goal: Risk for impaired skin integrity will decrease Outcome: Progressing

## 2021-11-27 NOTE — Discharge Summary (Signed)
Patient ID: KAHLEL PEAKE MRN: [id number] DOB/AGE: 1950/10/24 71 y.o.  Admit date: 11/25/2021 Discharge date: 11/27/2021   Discharge Diagnoses:  Principal Problem:   Acute appendicitis   Procedures:  Laparoscopic appendectomy  Hospital Course: Patient was admitted on 11/25/21 with acute appendicitis and underwent a laparoscopic appendectomy on 11/27/21.  Post-operatively, the patient has recovered well, is on a regular diet tolerating without worsening pain or nausea, pain is controlled, ambulating.  Coumadin resumed yesterday and INR today is 1.3.  He will resume his home dosing.  On exam, abdomen is soft, non-distended, appropriately sore.  Incisions clean, dry, intact.  OK to d/c home today, follow up with Dr. Christian Mate in 2 weeks.  Consults: None  Disposition: Discharge disposition: 01-Home or Self Care       Discharge Instructions     Call MD for:  difficulty breathing, headache or visual disturbances   Complete by: As directed    Call MD for:  persistant nausea and vomiting   Complete by: As directed    Call MD for:  redness, tenderness, or signs of infection (pain, swelling, redness, odor or green/yellow discharge around incision site)   Complete by: As directed    Call MD for:  severe uncontrolled pain   Complete by: As directed    Call MD for:  temperature >100.4   Complete by: As directed    Diet - low sodium heart healthy   Complete by: As directed    Discharge instructions   Complete by: As directed    1.  Patient may shower, but do not scrub wounds heavily and dab dry only. 2.  Do not submerge wounds in pool/tub until fully healed. 3.  Do not apply ointments or hydrogen peroxide to the wounds. 4.  May apply ice packs to the wounds for comfort.   Driving Restrictions   Complete by: As directed    Do not drive while taking narcotics for pain control.  Prior to driving, make sure you are able to rotate right and left to look at blindspots without  significant pain or discomfort.   Increase activity slowly   Complete by: As directed    Lifting restrictions   Complete by: As directed    No heavy lifting or pushing of more than 10-15 lbs for 4 weeks.   No dressing needed   Complete by: As directed       Allergies as of 11/27/2021   No Known Allergies      Medication List     TAKE these medications    acetaminophen 500 MG tablet Commonly known as: TYLENOL Take 1,000 mg by mouth every 6 (six) hours as needed for moderate pain or headache.   amLODipine 5 MG tablet Commonly known as: NORVASC Take 5 mg by mouth daily.   beclomethasone 80 MCG/ACT inhaler Commonly known as: QVAR Inhale 1 puff into the lungs daily.   ECHINACEA PO Take 2 tablets by mouth 4 (four) times daily as needed (cold symptoms).   fluticasone 50 MCG/ACT nasal spray Commonly known as: FLONASE Place 1 spray into both nostrils daily.   hydrochlorothiazide 25 MG tablet Commonly known as: HYDRODIURIL Take 25 mg by mouth daily.   Klor-Con M20 20 MEQ tablet Generic drug: potassium chloride SA Take 20 mEq by mouth daily.   loratadine 10 MG tablet Commonly known as: CLARITIN Take 10 mg by mouth daily as needed for allergies.   metoprolol succinate 25 MG 24 hr tablet Commonly known as: Toprol XL  Take 1 tablet (25 mg total) by mouth daily.   omeprazole 20 MG capsule Commonly known as: PRILOSEC Take 40 mg by mouth daily.   oxyCODONE 5 MG immediate release tablet Commonly known as: Oxy IR/ROXICODONE Take 1 tablet (5 mg total) by mouth every 6 (six) hours as needed for severe pain.   rosuvastatin 10 MG tablet Commonly known as: CRESTOR Take 10 mg by mouth daily.   sildenafil 100 MG tablet Commonly known as: VIAGRA Take 100 mg by mouth daily as needed for erectile dysfunction.   VITAMIN D PO Take 1 capsule by mouth daily.   warfarin 5 MG tablet Commonly known as: COUMADIN Take as directed. If you are unsure how to take this medication,  talk to your nurse or doctor. Original instructions: Take 2.5-5 mg by mouth See admin instructions. Or as directed by coumadin clinic Take 5 mg on Sun, Tues,Thurs, Sat. Take 2.5 mg on Mon, Wed, and Fri   ZINC PO Take 1 tablet by mouth daily.               Discharge Care Instructions  (From admission, onward)           Start     Ordered   11/27/21 0000  No dressing needed        11/27/21 0805            Follow-up Information     Ronny Bacon, MD Follow up in 2 week(s).   Specialty: General Surgery Contact information: [address] [place] [address] [phone]

## 2021-11-27 NOTE — Plan of Care (Signed)

## 2021-11-30 ENCOUNTER — Telehealth: Payer: Self-pay

## 2021-11-30 DIAGNOSIS — Z5181 Encounter for therapeutic drug level monitoring: Secondary | ICD-10-CM

## 2021-11-30 DIAGNOSIS — Z9889 Other specified postprocedural states: Secondary | ICD-10-CM

## 2021-11-30 DIAGNOSIS — I480 Paroxysmal atrial fibrillation: Secondary | ICD-10-CM

## 2021-12-08 ENCOUNTER — Telehealth: Payer: Self-pay

## 2021-12-08 DIAGNOSIS — M6208 Separation of muscle (nontraumatic), other site: Secondary | ICD-10-CM | POA: Diagnosis not present

## 2021-12-08 DIAGNOSIS — D6859 Other primary thrombophilia: Secondary | ICD-10-CM | POA: Diagnosis not present

## 2021-12-08 DIAGNOSIS — C61 Malignant neoplasm of prostate: Secondary | ICD-10-CM | POA: Diagnosis not present

## 2021-12-08 DIAGNOSIS — I480 Paroxysmal atrial fibrillation: Secondary | ICD-10-CM | POA: Diagnosis not present

## 2021-12-08 DIAGNOSIS — I10 Essential (primary) hypertension: Secondary | ICD-10-CM | POA: Diagnosis not present

## 2021-12-08 DIAGNOSIS — I2510 Atherosclerotic heart disease of native coronary artery without angina pectoris: Secondary | ICD-10-CM | POA: Diagnosis not present

## 2021-12-08 NOTE — Telephone Encounter (Signed)
12/07/2021    INR today was 2.4. Patient goal level is (2-3)  Prior dose was 2.5 mg Monday, Wednesday, Friday, and 5 mg all the other days.  Patient will continue 2.5 mg Monday, Wednesday, Friday, and 5 mg all the other days.   Pt will recheck INR 1 weeks on 11/06.   Lab Results  Component Value Date   INR 1.3 (H) 11/27/2021   INR 1.8 (H) 11/26/2021   INR 2.3 (H) 11/25/2021

## 2021-12-10 ENCOUNTER — Encounter: Payer: Self-pay | Admitting: Physician Assistant

## 2021-12-10 ENCOUNTER — Ambulatory Visit (INDEPENDENT_AMBULATORY_CARE_PROVIDER_SITE_OTHER): Payer: BC Managed Care – PPO | Admitting: Physician Assistant

## 2021-12-10 VITALS — BP 148/79 | HR 62 | Temp 97.9°F | Ht 63.0 in | Wt 154.4 lb

## 2021-12-10 DIAGNOSIS — K353 Acute appendicitis with localized peritonitis, without perforation or gangrene: Secondary | ICD-10-CM

## 2021-12-10 DIAGNOSIS — Z09 Encounter for follow-up examination after completed treatment for conditions other than malignant neoplasm: Secondary | ICD-10-CM

## 2021-12-10 DIAGNOSIS — K3580 Unspecified acute appendicitis: Secondary | ICD-10-CM

## 2021-12-10 NOTE — Patient Instructions (Signed)

## 2021-12-10 NOTE — Progress Notes (Addendum)
[facility] POST-OP OFFICE VISIT  12/10/2021  HPI: LAUREL SMELTZ is a 71 y.o. male 14 days s/p laparoscopic appendectomy for acute appendicitis with Dr Christian Mate.   Doing well No abdominal pain No fever, chills, nausea, emesis Good appetite, normal bowel function No other complaints  Vital signs: BP (!) 148/79   Pulse 62   Temp 97.9 F (36.6 C) (Oral)   Ht '5\' 3"'$  (1.6 m)   Wt 154 lb 6.4 oz (70 kg)   SpO2 97%   BMI 27.35 kg/m    Physical Exam: Constitutional: Well appearing male, NAD Abdomen: Soft, non-tender, non-distended, no rebound/guarding Skin: Laparoscopic incisions are healing well, no erythema or drainage   Assessment/Plan: This is a 71 y.o. male 14 days s/p laparoscopic appendectomy for acute appendicitis with Dr Christian Mate.    - Pain control prn  - Reviewed wound care recommendation  - Reviewed lifting restrictions; 4 weeks total  - Reviewed surgical pathology; Appendicitis   - He can follow up on as needed basis; He understands to call with questions/concerns  -- Edison Simon, PA-C [facility] 12/10/2021, 3:31 PM M-F: 7am - 4pm

## 2021-12-21 ENCOUNTER — Telehealth: Payer: Self-pay

## 2021-12-21 NOTE — Telephone Encounter (Signed)
12/21/2021    INR today was 2.4. Patient goal level is (2.0-3.0)  Prior dose was  2.5 mg Monday, Wednesday, Friday, and 5 mg all the other days.   Patient will continue  2.5 mg Monday, Wednesday, Friday, and 5 mg all the other days.    Pt will recheck INR 1 weeks on 11/20.   Lab Results  Component Value Date   INR 1.3 (H) 11/27/2021   INR 1.8 (H) 11/26/2021   INR 2.3 (H) 11/25/2021    Called patient no answer, could not leave a vm, due that vm is not set up.

## 2021-12-23 ENCOUNTER — Telehealth: Payer: Self-pay

## 2021-12-23 NOTE — Telephone Encounter (Signed)
Called patient to inform him that we are no longer checking INR at home, informed him that he would need to come to the office to check the INR if not then patient woill need to call PCP to see if they can manage INR from hime. Patient understood and will call PCP to see if they can do it if not he will make an appt to check it in our offfice.

## 2021-12-24 NOTE — Telephone Encounter (Signed)
error 

## 2022-01-15 DIAGNOSIS — Z0100 Encounter for examination of eyes and vision without abnormal findings: Secondary | ICD-10-CM | POA: Diagnosis not present

## 2022-01-15 DIAGNOSIS — H2513 Age-related nuclear cataract, bilateral: Secondary | ICD-10-CM | POA: Diagnosis not present

## 2022-02-24 ENCOUNTER — Telehealth: Payer: Self-pay

## 2022-02-24 NOTE — Telephone Encounter (Signed)
Spoke with patient about coming in, in person for INR's. He agree to have them done in office. I scheduled him for a month from the last time he did it, 03/04/22 @ 8 AM.

## 2022-03-03 DIAGNOSIS — C61 Malignant neoplasm of prostate: Secondary | ICD-10-CM | POA: Diagnosis not present

## 2022-03-04 ENCOUNTER — Ambulatory Visit: Payer: BC Managed Care – PPO | Admitting: Cardiology

## 2022-03-04 DIAGNOSIS — Z9889 Other specified postprocedural states: Secondary | ICD-10-CM | POA: Diagnosis not present

## 2022-03-04 DIAGNOSIS — I480 Paroxysmal atrial fibrillation: Secondary | ICD-10-CM | POA: Diagnosis not present

## 2022-03-04 DIAGNOSIS — Z5181 Encounter for therapeutic drug level monitoring: Secondary | ICD-10-CM

## 2022-03-04 DIAGNOSIS — Z7901 Long term (current) use of anticoagulants: Secondary | ICD-10-CM | POA: Diagnosis not present

## 2022-03-04 LAB — POCT INR: INR: 2.3 (ref 2.0–3.0)

## 2022-03-04 NOTE — Progress Notes (Signed)
Description   03/04/2022    INR today was 2.3. Patient goal level is (2.0-3.0)  Prior dose was 2.5 mg Mondays, Wednesday, Friday and 5 mg all the other days.  Patient will continue taking 2.5 mg Mondays, Wednesday, Friday and 5 mg all the other days.  Pt will recheck INR 6 weeks on 03/07.   Lab Results      Component                Value               Date                      INR                      1.3 (H)             11/27/2021                INR                      1.8 (H)             11/26/2021                INR                      2.3 (H)             11/25/2021             H/O mitral valve repair  (primary encounter diagnosis)  Monitoring for long-term anticoagulant use  Paroxysmal atrial fibrillation ([place])    Home INR monitoring results  12/21/2021     INR today was 2.4. Patient goal level is (2.0-3.0)   Prior dose was  2.5 mg Monday, Wednesday, Friday, and 5 mg all the other days.    Patient will continue  2.5 mg Monday, Wednesday, Friday, and 5 mg all the other days.     Pt will recheck INR 1 weeks on 11/20.  Patient is therapeutic today, he will continue to monitor and is aware of the risks of not following up on a regular basis.   Adrian Prows, MD, [facility] 03/04/2022, 3:42 PM Office: [phone] Fax: [phone] Pager: [phone]

## 2022-03-12 DIAGNOSIS — C61 Malignant neoplasm of prostate: Secondary | ICD-10-CM | POA: Diagnosis not present

## 2022-03-12 DIAGNOSIS — N401 Enlarged prostate with lower urinary tract symptoms: Secondary | ICD-10-CM | POA: Diagnosis not present

## 2022-03-12 DIAGNOSIS — R3912 Poor urinary stream: Secondary | ICD-10-CM | POA: Diagnosis not present

## 2022-03-24 DIAGNOSIS — Z7901 Long term (current) use of anticoagulants: Secondary | ICD-10-CM | POA: Diagnosis not present

## 2022-04-12 DIAGNOSIS — D225 Melanocytic nevi of trunk: Secondary | ICD-10-CM | POA: Diagnosis not present

## 2022-04-12 DIAGNOSIS — L853 Xerosis cutis: Secondary | ICD-10-CM | POA: Diagnosis not present

## 2022-04-12 DIAGNOSIS — L814 Other melanin hyperpigmentation: Secondary | ICD-10-CM | POA: Diagnosis not present

## 2022-04-12 DIAGNOSIS — L570 Actinic keratosis: Secondary | ICD-10-CM | POA: Diagnosis not present

## 2022-04-12 DIAGNOSIS — L821 Other seborrheic keratosis: Secondary | ICD-10-CM | POA: Diagnosis not present

## 2022-04-14 ENCOUNTER — Telehealth: Payer: Self-pay

## 2022-04-14 NOTE — Telephone Encounter (Signed)
I am fine with this.

## 2022-04-14 NOTE — Telephone Encounter (Signed)
Patient is calling to let us know that his PCP Dr. Fara Olden is now following and monitoring the patient INR. She is allowing him to do it at home and call in when he does it to give her the results, and he wants to continue this with them.

## 2022-09-10 DIAGNOSIS — N401 Enlarged prostate with lower urinary tract symptoms: Secondary | ICD-10-CM | POA: Diagnosis not present

## 2022-10-18 DIAGNOSIS — J4520 Mild intermittent asthma, uncomplicated: Secondary | ICD-10-CM | POA: Diagnosis not present

## 2022-10-18 DIAGNOSIS — C439 Malignant melanoma of skin, unspecified: Secondary | ICD-10-CM | POA: Diagnosis not present

## 2022-10-18 DIAGNOSIS — Z0000 Encounter for general adult medical examination without abnormal findings: Secondary | ICD-10-CM | POA: Diagnosis not present

## 2022-10-18 DIAGNOSIS — K429 Umbilical hernia without obstruction or gangrene: Secondary | ICD-10-CM | POA: Diagnosis not present

## 2022-10-18 DIAGNOSIS — J309 Allergic rhinitis, unspecified: Secondary | ICD-10-CM | POA: Diagnosis not present

## 2022-10-18 DIAGNOSIS — Z23 Encounter for immunization: Secondary | ICD-10-CM | POA: Diagnosis not present

## 2022-10-18 DIAGNOSIS — I480 Paroxysmal atrial fibrillation: Secondary | ICD-10-CM | POA: Diagnosis not present

## 2022-10-18 DIAGNOSIS — I10 Essential (primary) hypertension: Secondary | ICD-10-CM | POA: Diagnosis not present

## 2022-10-18 DIAGNOSIS — M6208 Separation of muscle (nontraumatic), other site: Secondary | ICD-10-CM | POA: Diagnosis not present

## 2022-10-18 DIAGNOSIS — I2510 Atherosclerotic heart disease of native coronary artery without angina pectoris: Secondary | ICD-10-CM | POA: Diagnosis not present

## 2023-01-17 DIAGNOSIS — Z7901 Long term (current) use of anticoagulants: Secondary | ICD-10-CM | POA: Diagnosis not present

## 2023-01-17 DIAGNOSIS — I480 Paroxysmal atrial fibrillation: Secondary | ICD-10-CM | POA: Diagnosis not present

## 2023-01-18 DIAGNOSIS — H2513 Age-related nuclear cataract, bilateral: Secondary | ICD-10-CM | POA: Diagnosis not present

## 2023-01-18 DIAGNOSIS — Z9889 Other specified postprocedural states: Secondary | ICD-10-CM | POA: Diagnosis not present

## 2023-01-18 DIAGNOSIS — H43812 Vitreous degeneration, left eye: Secondary | ICD-10-CM | POA: Diagnosis not present

## 2023-03-22 ENCOUNTER — Telehealth: Payer: Self-pay

## 2023-03-22 NOTE — Telephone Encounter (Signed)
Received INR results from [facility] via fax. Robert Lynn is a patient of Dr Jacinto Halim and his INR was previously managed by [facility] office. Per note on 04/14/22, pt's INR/Warfarin is now managed by PCP. I called PCP and made them aware of pt's results.

## 2023-04-01 DIAGNOSIS — C61 Malignant neoplasm of prostate: Secondary | ICD-10-CM | POA: Diagnosis not present

## 2023-04-18 ENCOUNTER — Telehealth: Payer: Self-pay | Admitting: *Deleted

## 2023-04-18 NOTE — Telephone Encounter (Signed)
 Received INR results from [facility].   Called and spoke to pt who stated that his PCP manages his warfarin and he checks his INR at home through [facility].   Called [facility] and made them aware that pt's PCP manages his INR and to send results to pt's PCP.

## 2023-04-28 ENCOUNTER — Ambulatory Visit: Payer: PPO | Attending: Cardiology | Admitting: Cardiology

## 2023-04-28 ENCOUNTER — Encounter: Payer: Self-pay | Admitting: Cardiology

## 2023-04-28 VITALS — BP 136/72 | HR 55 | Resp 16 | Ht 63.0 in | Wt 150.0 lb

## 2023-04-28 DIAGNOSIS — Z7901 Long term (current) use of anticoagulants: Secondary | ICD-10-CM

## 2023-04-28 DIAGNOSIS — Z9889 Other specified postprocedural states: Secondary | ICD-10-CM | POA: Diagnosis not present

## 2023-04-28 DIAGNOSIS — I493 Ventricular premature depolarization: Secondary | ICD-10-CM | POA: Diagnosis not present

## 2023-04-28 DIAGNOSIS — I059 Rheumatic mitral valve disease, unspecified: Secondary | ICD-10-CM

## 2023-04-28 DIAGNOSIS — Z5181 Encounter for therapeutic drug level monitoring: Secondary | ICD-10-CM | POA: Diagnosis not present

## 2023-04-28 DIAGNOSIS — I10 Essential (primary) hypertension: Secondary | ICD-10-CM

## 2023-04-28 DIAGNOSIS — E7800 Pure hypercholesterolemia, unspecified: Secondary | ICD-10-CM

## 2023-04-28 DIAGNOSIS — I480 Paroxysmal atrial fibrillation: Secondary | ICD-10-CM | POA: Diagnosis not present

## 2023-04-28 DIAGNOSIS — Z8673 Personal history of transient ischemic attack (TIA), and cerebral infarction without residual deficits: Secondary | ICD-10-CM | POA: Diagnosis not present

## 2023-04-28 NOTE — Patient Instructions (Signed)
 Medication Instructions:  Your physician recommends that you continue on your current medications as directed. Please refer to the Current Medication list given to you today.  *If you need a refill on your cardiac medications before your next appointment, please call your pharmacy*  Lab Work: If you have labs (blood work) drawn today and your tests are completely normal, you will receive your results only by: MyChart Message (if you have MyChart) OR A paper copy in the mail If you have any lab test that is abnormal or we need to change your treatment, we will call you to review the results.  Testing/Procedures: Your physician has requested that you have an echocardiogram in February 2026. Echocardiography is a painless test that uses sound waves to create images of your heart. It provides your doctor with information about the size and shape of your heart and how well your heart's chambers and valves are working. This procedure takes approximately one hour. There are no restrictions for this procedure. Please do NOT wear cologne, perfume, aftershave, or lotions (deodorant is allowed). Please arrive 15 minutes prior to your appointment time.  Please note: We ask at that you not bring children with you during ultrasound (echo/ vascular) testing. Due to room size and safety concerns, children are not allowed in the ultrasound rooms during exams. Our front office staff cannot provide observation of children in our lobby area while testing is being conducted. An adult accompanying a patient to their appointment will only be allowed in the ultrasound room at the discretion of the ultrasound technician under special circumstances. We apologize for any inconvenience. Follow-Up: At [facility], you and your health needs are our priority.  As part of our continuing mission to provide you with exceptional heart care, we have created designated Provider Care Teams.  These Care Teams include your  primary Cardiologist (physician) and Advanced Practice Providers (APPs -  Physician Assistants and Nurse Practitioners) who all work together to provide you with the care you need, when you need it.  We recommend signing up for the patient portal called "MyChart".  Sign up information is provided on this After Visit Summary.  MyChart is used to connect with patients for Virtual Visits (Telemedicine).  Patients are able to view lab/test results, encounter notes, upcoming appointments, etc.  Non-urgent messages can be sent to your provider as well.   To learn more about what you can do with MyChart, go to [email or web address].    Your next appointment:   1 year(s)  Provider:   Tessa Lerner, DO     Other Instructions       1st Floor: - Lobby - Registration  - Pharmacy  - Lab - Cafe  2nd Floor: - PV Lab - Diagnostic Testing (echo, CT, nuclear med)  3rd Floor: - Vacant  4th Floor: - TCTS (cardiothoracic surgery) - AFib Clinic - Structural Heart Clinic - Vascular Surgery  - Vascular Ultrasound  5th Floor: - [facility] (general and EP) - Clinical Pharmacy for coumadin, hypertension, lipid, weight-loss medications, and med management appointments    Valet parking services will be available as well.

## 2023-04-28 NOTE — Progress Notes (Signed)
 Cardiology Office Note:  .   Date:  04/28/2023  ID:  Robert Lynn, DOB Oct 22, 1950, MRN [id number] PCP:  Lorenda Ishihara, MD  Former Cardiology Providers: Dr. D[facility] Providers Cardiologist:  Tessa Lerner, DO , [facility] (established care 04/28/23) Electrophysiologist:  None  Click to update primary MD,subspecialty MD or APP then REFRESH:1}    Chief Complaint  Patient presents with   Follow-up   Paroxysmal atrial fibrillation    History of Present Illness: .   Robert Lynn is a 73 y.o. Caucasian male whose past medical history and cardiovascular risk factors includes: Prostate cancer, dyslipidemia, Hx of melanoma, GERD, Hx of MRSA, HTN, history of CVA, Hx of MVP diagnosed in 1990 and subsequently underwent Mitral Valve Repair (2002), Long-term oral anticoagulation (since 2012 after CVA), advanced age.   Patient has a known history of mitral valve prolapse diagnosed in 1990 and underwent mitral valve repair with Dr. Barry Dienes in 2022.  In 2021 patient presented to the ED with aphasia and was diagnosed with a stroke.  MRI/MRA noted acute infarctions involving the left hemisphere affecting the insular cortex, parietal, and posterior temporal lobes.  He was also noted to have remote infarcts in the right parietal and the left parietal lobes per report.  He did undergo extensive cardiovascular testing at that time and was placed on Coumadin due to concerns for cardioembolic pathology.  Patient was referred to my practice in January 2022 with question for long-term oral anticoagulation.  Patient underwent cardiac monitor at that time which reported the presence of atrial fibrillation.  Given his history of mitral valve repair patient was placed on Coumadin for thromboembolic prophylaxis.  He follows with the Coumadin clinic for INR checks.  In the past he was noted to have reversible ischemia on stress test and underwent left heart catheterization which noted minimal  CAD.  His last office visit was in October 2022 he presents today to reestablish care.  Since last office visit patient denies any anginal chest pain or heart failure symptoms.  No hospitalizations for cardiovascular reasons.  He was monitoring his INRs with home monitoring in the past but now is being managed by his PCP.  Goal INR is between 2.5-3.5.  In the setting of mitral valve repair plus atrial fibrillation.  Patient states that he is on metoprolol 25 mg p.o. daily but feels tired and fatigue but still appreciates extra beats and knows the importance of the medication.  Overall functional capacity is stable.  He goes to the gym couple times a week for at least 60 minutes.  He anticipates increased physical activity during the summer months.  Review of Systems: .   Review of Systems  Cardiovascular:  Negative for chest pain, claudication, irregular heartbeat, leg swelling, near-syncope, orthopnea, palpitations, paroxysmal nocturnal dyspnea and syncope.  Respiratory:  Negative for shortness of breath.   Hematologic/Lymphatic: Negative for bleeding problem.    Studies Reviewed:   EKG: EKG Interpretation Date/Time:  Thursday April 28 2023 08:49:06 EDT Ventricular Rate:  55 PR Interval:  190 QRS Duration:  102 QT Interval:  488 QTC Calculation: 466 R Axis:   -1  Text Interpretation: Sinus bradycardia with sinus arrhythmia with occasional Premature ventricular complexes Nonspecific ST abnormality When compared with ECG of 28-Oct-2020 09:15, Premature ventricular complexes are now Present Confirmed by Tessa Lerner [phone]) on 04/28/2023 9:01:27 AM  Mitral valve repair:  S/P Median sternotomy for mitral valve repair (quadrangular resection of posterior leaflet, chordal transfer from  posterior to anterior leaflet x 2, artificial chordal replacement x 3, #25 Carpentier-Edwards ring annuloplasty) by Dr. Tressie Stalker.   Echocardiogram: 04/2020: LVEF 55 to 60%, right ventricular systolic  function is low normal, right ventricular size is normal, Annuloplasty ring present (Carpentier-Edwards) well seated, no evidence of dehiscence. No evidence of mitral valve regurgitation. Mild mitral stenosis (peak velocity 2.54m/s, MG , MVA 1.95cm2, PHT , HR 73bpm).   Heart Catheterization: Right and left heart catheterization 10/28/2020: RA 9/9, mean 7; RV 31/1, EDP 8 mmHg PA 36/13, mean 22 mmHg, PA saturation 72%. PW 21/23, mean 17 mmHg.  Aortic saturation 98%. QP/QS 1.0. CO 5.12, CI 3.01.  PVR 1.66 Wood units.   LV: 157/10, EDP 19 mmHg.  Ao 152/69, mean 96 mmHg.  No pressure gradient across the aortic valve. RCA: Dominant vessel.  Mild diffuse disease.  It continues as a moderate-sized PDA and PL branch. Left main: Large-caliber vessel.  Distal left main has a 20% stenosis. Circumflex: Large caliber circumflex, very small OM1 and OM 2 and continues as a large OM 3.  Mild 20 to 30% luminal irregularity in the proximal and mid segment. LAD: Moderate-sized vessel, diffuse disease.  Gives origin to 3 small diagonals in the proximal and mid segment, 20 to 30% luminal irregularity in the proximal and mid LAD.  Impression: No significant coronary artery disease.  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   14 day extended Holter monitor: Dominant rhythm normal sinus rhythm, followed by atrial fibrillation. Heart rate 45-142 bpm. Avg HR 72 bpm. No high grade AV block or pauses (3 seconds or longer). 2 episodes of nonsustained ventricular tachycardia, 4 beats in duration, fastest 158 bpm.  Atrial fibrillation burden approximately 1% (average heart rate 99 bpm, heart rate ranging between 60- 146 bpm). Longest duration of atrial fibrillation 45 minutes and 41 seconds. Total supraventricular ectopic burden <1%. Total ventricular ectopic burden of approximately 4.2% (episodes of isolated PVCs, and PVCs in bigeminy/trigeminy pattern) Patient triggered  events: 1. Underlying rhythm normal sinus with rare ventricular ectopy.    RADIOLOGY: MRI /MRA Head and Neck w/ w/o contrast: 11/19/2010: Moderate sized acute left hemisphere infarct affects the insular cortex, parietal and posterior temporal lobes, and underlying white matter.   Remote infarcts of the right parietal and left parietal lobes are observed.  There is also a small remote right posterior frontal infarct.  Remote cerebellar lacunar infarcts are visualized.  Brainstem is spared. Mild irregularity distal MCA and PCA branches consistent with intracranial atherosclerotic disease.  No proximal stenosis.   Risk Assessment/Calculations:   NA   Labs:    External Labs:  Collected: 02/20/2020 provided by PCP  Creatinine 0.92 mg/dL. eGFR: 82 mL/min per 1.73 m Lipid profile: Total cholesterol 171, triglycerides 166, HDL 52, LDL 90, non-HDL 119 AST 27, ALT 21, alkaline phosphatase 65 Hemoglobin 14.3 g/dL, hematocrit 16.1%  External Labs: Collected: 10/18/2022 [facility] database. Total cholesterol 182, triglycerides 209, HDL 44, LDL 102.  Physical Exam:    Today's Vitals   04/28/23 0845  BP: 136/72  Pulse: (!) 55  Resp: 16  SpO2: 95%  Weight: 150 lb (68 kg)  Height: 5\' 3"  (1.6 m)   Body mass index is 26.57 kg/m. Wt Readings from Last 3 Encounters:  04/28/23 150 lb (68 kg)  12/10/21 154 lb 6.4 oz (70 kg)  12/04/20 154 lb (69.9 kg)    Physical Exam  Constitutional: No distress.  hemodynamically stable  Neck: No JVD  present.  Cardiovascular: Normal rate, regular rhythm, S1 normal and S2 normal. Exam reveals no gallop, no S3 and no S4.  No murmur heard. Pulmonary/Chest: Effort normal and breath sounds normal. No stridor. He has no wheezes. He has no rales.  Musculoskeletal:        General: No edema.     Cervical back: Neck supple.  Skin: Skin is warm.   Impression & Recommendation(s):  Impression:   ICD-10-CM   1. Paroxysmal atrial fibrillation (HCC)  I48.0 EKG 12-Lead     2. Monitoring for long-term anticoagulant use  Z51.81    Z79.01     3. Mitral valve disorder  I05.9 ECHOCARDIOGRAM COMPLETE    4. H/O mitral valve repair  Z98.890 ECHOCARDIOGRAM COMPLETE    5. PVC (premature ventricular contraction)  I49.3     6. Benign hypertension  I10     7. History of stroke  Z86.73     8. Hypercholesteremia  E78.00        Recommendation(s):  Paroxysmal atrial fibrillation (HCC) Rate control: Metoprolol. Rhythm control: N/A. Thromboembolic prophylaxis: Coumadin CHA2DS2-VASc SCORE is 4 which correlates to 4.4% risk of stroke per year (hypertension, age, history of stroke).   Monitoring for long-term anticoagulant use Currently on Coumadin given his history of A-fib as well as mitral valve repair. INRs are currently being managed by his PCP. Goal INR between 2.5-3.5 Patient does not endorse evidence of bleeding.  Risks, benefits, and alternatives discussed with him during the visit.  Mitral valve disorder H/O mitral valve repair History of mitral valve prolapse s/p repair in 2002 with Dr. Barry Dienes Last echocardiogram in 2022. Reemphasized importance of antibiotic prophylaxis. Plan echo prior to the next office visit to reevaluate mitral valve disease.  PVC (premature ventricular contraction) Still present. Noticeable. Only able to tolerate Toprol-XL 25 mg p.o. daily  Benign hypertension Office and home blood pressures are well-controlled. Continue amlodipine 5 mg p.o. daily. Continue hydrochlorothiazide 25 mg p.o. daily. Continue Toprol-XL 25 mg p.o. daily  Hypercholesteremia Currently on Crestor 10 mg p.o. daily.   He denies myalgia or other side effects. Most recent lipids dated September 2024, independently reviewed as noted above.  LDL is 102 mg/dL.  Recommend that he focus on lifestyle changes to reduce foods that are high lipid content. Cardiology following peripherally, managed by primary care provider.  Orders Placed:  Orders Placed  This Encounter  Procedures   EKG 12-Lead   ECHOCARDIOGRAM COMPLETE    Standing Status:   Future    Expected Date:   03/12/2024    Expiration Date:   04/27/2024    Where should this test be performed:   [facility])    Does the patient weigh less than or greater than 250 lbs?:   Patient weighs less than 250 lbs    Perflutren DEFINITY (image enhancing agent) should be administered unless hypersensitivity or allergy exist:   Administer Perflutren    Reason for exam-Echo:   S/P Mitral Valve repair Z98.89     Final Medication List:   No orders of the defined types were placed in this encounter.   There are no discontinued medications.   Current Outpatient Medications:    acetaminophen (TYLENOL) 500 MG tablet, Take 1,000 mg by mouth every 6 (six) hours as needed for moderate pain or headache., Disp: , Rfl:    amLODipine (NORVASC) 5 MG tablet, Take 5 mg by mouth daily., Disp: , Rfl:    beclomethasone (QVAR) 80 MCG/ACT inhaler, Inhale  1 puff into the lungs daily., Disp: , Rfl:    ECHINACEA PO, Take 2 tablets by mouth 4 (four) times daily as needed (cold symptoms)., Disp: , Rfl:    fluticasone (FLONASE) 50 MCG/ACT nasal spray, Place 1 spray into both nostrils daily., Disp: , Rfl:    hydrochlorothiazide (HYDRODIURIL) 25 MG tablet, Take 25 mg by mouth daily., Disp: , Rfl:    KLOR-CON M20 20 MEQ tablet, Take 20 mEq by mouth daily., Disp: , Rfl:    loratadine (CLARITIN) 10 MG tablet, Take 10 mg by mouth daily as needed for allergies., Disp: , Rfl:    metoprolol succinate (TOPROL XL) 25 MG 24 hr tablet, Take 1 tablet (25 mg total) by mouth daily., Disp: 90 tablet, Rfl: 3   Multiple Vitamins-Minerals (ZINC PO), Take 1 tablet by mouth daily., Disp: , Rfl:    omeprazole (PRILOSEC) 20 MG capsule, Take 40 mg by mouth daily., Disp: , Rfl:    rosuvastatin (CRESTOR) 10 MG tablet, Take 10 mg by mouth daily., Disp: , Rfl:    sildenafil (VIAGRA) 100 MG tablet, Take 100 mg by mouth daily as  needed for erectile dysfunction., Disp: , Rfl:    VITAMIN D PO, Take 1 capsule by mouth daily., Disp: , Rfl:    warfarin (COUMADIN) 5 MG tablet, Take 2.5-5 mg by mouth See admin instructions. Or as directed by coumadin clinic Take 5 mg on Sun, Tues,Thurs, Sat. Take 2.5 mg on Mon, Wed, and Fri, Disp: , Rfl:   Consent:   NA  Disposition:   1 year follow-up sooner if needed  His questions and concerns were addressed to his satisfaction. He voices understanding of the recommendations provided during this encounter.    Signed, Tessa Lerner, DO, [facility]  [facility]  [address] [place], [place] [address] 04/28/2023 9:33 AM

## 2023-05-03 DIAGNOSIS — J4520 Mild intermittent asthma, uncomplicated: Secondary | ICD-10-CM | POA: Diagnosis not present

## 2023-05-03 DIAGNOSIS — I10 Essential (primary) hypertension: Secondary | ICD-10-CM | POA: Diagnosis not present

## 2023-05-03 DIAGNOSIS — Z23 Encounter for immunization: Secondary | ICD-10-CM | POA: Diagnosis not present

## 2023-05-03 DIAGNOSIS — I2510 Atherosclerotic heart disease of native coronary artery without angina pectoris: Secondary | ICD-10-CM | POA: Diagnosis not present

## 2023-05-03 DIAGNOSIS — D6869 Other thrombophilia: Secondary | ICD-10-CM | POA: Diagnosis not present

## 2023-05-03 DIAGNOSIS — I480 Paroxysmal atrial fibrillation: Secondary | ICD-10-CM | POA: Diagnosis not present

## 2023-05-09 DIAGNOSIS — Z7901 Long term (current) use of anticoagulants: Secondary | ICD-10-CM | POA: Diagnosis not present

## 2023-05-09 DIAGNOSIS — I480 Paroxysmal atrial fibrillation: Secondary | ICD-10-CM | POA: Diagnosis not present

## 2023-05-11 DIAGNOSIS — L821 Other seborrheic keratosis: Secondary | ICD-10-CM | POA: Diagnosis not present

## 2023-05-11 DIAGNOSIS — L2989 Other pruritus: Secondary | ICD-10-CM | POA: Diagnosis not present

## 2023-05-11 DIAGNOSIS — L538 Other specified erythematous conditions: Secondary | ICD-10-CM | POA: Diagnosis not present

## 2023-05-11 DIAGNOSIS — L820 Inflamed seborrheic keratosis: Secondary | ICD-10-CM | POA: Diagnosis not present

## 2023-05-11 DIAGNOSIS — D225 Melanocytic nevi of trunk: Secondary | ICD-10-CM | POA: Diagnosis not present

## 2023-05-11 DIAGNOSIS — L814 Other melanin hyperpigmentation: Secondary | ICD-10-CM | POA: Diagnosis not present

## 2023-05-11 DIAGNOSIS — D492 Neoplasm of unspecified behavior of bone, soft tissue, and skin: Secondary | ICD-10-CM | POA: Diagnosis not present

## 2023-05-11 DIAGNOSIS — L570 Actinic keratosis: Secondary | ICD-10-CM | POA: Diagnosis not present

## 2023-06-06 DIAGNOSIS — Z7901 Long term (current) use of anticoagulants: Secondary | ICD-10-CM | POA: Diagnosis not present

## 2023-07-04 DIAGNOSIS — Z7901 Long term (current) use of anticoagulants: Secondary | ICD-10-CM | POA: Diagnosis not present

## 2023-08-01 DIAGNOSIS — Z7901 Long term (current) use of anticoagulants: Secondary | ICD-10-CM | POA: Diagnosis not present

## 2023-08-31 DIAGNOSIS — Z7901 Long term (current) use of anticoagulants: Secondary | ICD-10-CM | POA: Diagnosis not present

## 2023-09-16 DIAGNOSIS — C61 Malignant neoplasm of prostate: Secondary | ICD-10-CM | POA: Diagnosis not present

## 2023-09-23 DIAGNOSIS — C61 Malignant neoplasm of prostate: Secondary | ICD-10-CM | POA: Diagnosis not present

## 2023-09-23 DIAGNOSIS — R3912 Poor urinary stream: Secondary | ICD-10-CM | POA: Diagnosis not present

## 2023-09-23 DIAGNOSIS — N401 Enlarged prostate with lower urinary tract symptoms: Secondary | ICD-10-CM | POA: Diagnosis not present

## 2023-09-23 DIAGNOSIS — N5201 Erectile dysfunction due to arterial insufficiency: Secondary | ICD-10-CM | POA: Diagnosis not present

## 2023-09-26 DIAGNOSIS — Z7901 Long term (current) use of anticoagulants: Secondary | ICD-10-CM | POA: Diagnosis not present

## 2023-10-24 DIAGNOSIS — C439 Malignant melanoma of skin, unspecified: Secondary | ICD-10-CM | POA: Diagnosis not present

## 2023-10-24 DIAGNOSIS — Z0000 Encounter for general adult medical examination without abnormal findings: Secondary | ICD-10-CM | POA: Diagnosis not present

## 2023-10-24 DIAGNOSIS — I2510 Atherosclerotic heart disease of native coronary artery without angina pectoris: Secondary | ICD-10-CM | POA: Diagnosis not present

## 2023-10-24 DIAGNOSIS — Z7901 Long term (current) use of anticoagulants: Secondary | ICD-10-CM | POA: Diagnosis not present

## 2023-10-24 DIAGNOSIS — I480 Paroxysmal atrial fibrillation: Secondary | ICD-10-CM | POA: Diagnosis not present

## 2023-10-24 DIAGNOSIS — D6869 Other thrombophilia: Secondary | ICD-10-CM | POA: Diagnosis not present

## 2023-10-24 DIAGNOSIS — C61 Malignant neoplasm of prostate: Secondary | ICD-10-CM | POA: Diagnosis not present

## 2023-10-24 DIAGNOSIS — J4520 Mild intermittent asthma, uncomplicated: Secondary | ICD-10-CM | POA: Diagnosis not present

## 2023-10-24 DIAGNOSIS — Z1331 Encounter for screening for depression: Secondary | ICD-10-CM | POA: Diagnosis not present

## 2023-10-24 DIAGNOSIS — I10 Essential (primary) hypertension: Secondary | ICD-10-CM | POA: Diagnosis not present

## 2023-10-24 DIAGNOSIS — E785 Hyperlipidemia, unspecified: Secondary | ICD-10-CM | POA: Diagnosis not present

## 2023-10-24 DIAGNOSIS — R739 Hyperglycemia, unspecified: Secondary | ICD-10-CM | POA: Diagnosis not present

## 2023-10-24 DIAGNOSIS — Z23 Encounter for immunization: Secondary | ICD-10-CM | POA: Diagnosis not present

## 2023-11-21 DIAGNOSIS — Z7901 Long term (current) use of anticoagulants: Secondary | ICD-10-CM | POA: Diagnosis not present

## 2023-12-19 DIAGNOSIS — Z7901 Long term (current) use of anticoagulants: Secondary | ICD-10-CM | POA: Diagnosis not present

## 2024-01-16 DIAGNOSIS — Z7901 Long term (current) use of anticoagulants: Secondary | ICD-10-CM | POA: Diagnosis not present

## 2024-02-29 ENCOUNTER — Other Ambulatory Visit (HOSPITAL_COMMUNITY): Payer: Self-pay

## 2024-03-02 ENCOUNTER — Other Ambulatory Visit (HOSPITAL_COMMUNITY): Payer: Self-pay

## 2024-03-04 ENCOUNTER — Other Ambulatory Visit (HOSPITAL_COMMUNITY): Payer: Self-pay

## 2024-03-04 MED ORDER — AMLODIPINE BESYLATE 5 MG PO TABS
5.0000 mg | ORAL_TABLET | Freq: Every day | ORAL | 5 refills | Status: AC
  Filled 2024-03-04: qty 90, 90d supply, fill #0

## 2024-03-04 MED ORDER — POTASSIUM CHLORIDE CRYS ER 20 MEQ PO TBCR
20.0000 meq | EXTENDED_RELEASE_TABLET | Freq: Every day | ORAL | 5 refills | Status: AC
  Filled 2024-03-04: qty 90, 90d supply, fill #0

## 2024-03-04 MED ORDER — GLUCOSE BLOOD VI STRP
ORAL_STRIP | 2 refills | Status: AC
  Filled 2024-03-04: qty 100, 100d supply, fill #0

## 2024-03-04 MED ORDER — HYDROCHLOROTHIAZIDE 25 MG PO TABS
25.0000 mg | ORAL_TABLET | Freq: Every morning | ORAL | 5 refills | Status: AC
  Filled 2024-03-04: qty 90, 90d supply, fill #0

## 2024-03-04 MED ORDER — ROSUVASTATIN CALCIUM 10 MG PO TABS: 10.0000 mg | ORAL_TABLET | Freq: Every day | ORAL | 5 refills | Status: AC

## 2024-03-04 MED ORDER — LANCETS MISC
2 refills | Status: AC
  Filled 2024-03-04: qty 100, 100d supply, fill #0

## 2024-03-04 MED ORDER — METOPROLOL SUCCINATE ER 25 MG PO TB24: 25.0000 mg | ORAL_TABLET | Freq: Every day | ORAL | 3 refills | Status: AC

## 2024-03-04 MED ORDER — AMOXICILLIN 500 MG PO CAPS: 2000.0000 mg | ORAL_CAPSULE | ORAL | 2 refills | Status: AC

## 2024-03-04 MED ORDER — ROSUVASTATIN CALCIUM 10 MG PO TABS: 10.0000 mg | ORAL_TABLET | Freq: Every day | ORAL | 3 refills | Status: AC

## 2024-03-04 MED ORDER — WARFARIN SODIUM 5 MG PO TABS: 5.0000 mg | ORAL_TABLET | ORAL | 3 refills | Status: AC

## 2024-03-04 MED ORDER — OMEPRAZOLE 20 MG PO CPDR
20.0000 mg | DELAYED_RELEASE_CAPSULE | ORAL | 3 refills | Status: AC
  Filled 2024-03-04: qty 90, 90d supply, fill #0

## 2024-03-04 MED ORDER — WARFARIN SODIUM 5 MG PO TABS: 5.0000 mg | ORAL_TABLET | ORAL | 5 refills | Status: AC

## 2024-03-04 MED ORDER — WARFARIN SODIUM 2.5 MG PO TABS: 2.5000 mg | ORAL_TABLET | ORAL | 5 refills | Status: AC

## 2024-03-05 ENCOUNTER — Other Ambulatory Visit: Payer: Self-pay

## 2024-03-05 ENCOUNTER — Other Ambulatory Visit (HOSPITAL_COMMUNITY): Payer: Self-pay

## 2024-03-06 ENCOUNTER — Other Ambulatory Visit: Payer: Self-pay

## 2024-03-19 ENCOUNTER — Ambulatory Visit (HOSPITAL_COMMUNITY)
